# Patient Record
Sex: Female | Born: 1970 | Race: Black or African American | Hispanic: No | Marital: Married | Smoking: Former smoker
Health system: Southern US, Community
[De-identification: ages and names within clinical notes are randomized; demographics above are authoritative.]

## PROBLEM LIST (undated history)

## (undated) DIAGNOSIS — D649 Anemia, unspecified: Secondary | ICD-10-CM

## (undated) DIAGNOSIS — O00109 Unspecified tubal pregnancy without intrauterine pregnancy: Secondary | ICD-10-CM

## (undated) DIAGNOSIS — I10 Essential (primary) hypertension: Secondary | ICD-10-CM

## (undated) DIAGNOSIS — Z9289 Personal history of other medical treatment: Secondary | ICD-10-CM

## (undated) DIAGNOSIS — R079 Chest pain, unspecified: Secondary | ICD-10-CM

## (undated) DIAGNOSIS — Z9889 Other specified postprocedural states: Secondary | ICD-10-CM

## (undated) DIAGNOSIS — E559 Vitamin D deficiency, unspecified: Secondary | ICD-10-CM

## (undated) DIAGNOSIS — Z923 Personal history of irradiation: Secondary | ICD-10-CM

## (undated) DIAGNOSIS — R001 Bradycardia, unspecified: Secondary | ICD-10-CM

## (undated) HISTORY — DX: Vitamin D deficiency, unspecified: E55.9

## (undated) HISTORY — DX: Other specified postprocedural states: Z98.890

## (undated) HISTORY — DX: Essential (primary) hypertension: I10

## (undated) HISTORY — PX: TUBAL LIGATION: SHX77

## (undated) HISTORY — DX: Personal history of irradiation: Z92.3

## (undated) HISTORY — DX: Bradycardia, unspecified: R00.1

## (undated) HISTORY — PX: ECTOPIC PREGNANCY SURGERY: SHX613

## (undated) HISTORY — DX: Personal history of other medical treatment: Z92.89

---

## 1997-10-08 ENCOUNTER — Inpatient Hospital Stay (HOSPITAL_COMMUNITY): Admission: AD | Admit: 1997-10-08 | Discharge: 1997-10-08 | Payer: Self-pay | Admitting: Obstetrics

## 2000-08-17 ENCOUNTER — Inpatient Hospital Stay (HOSPITAL_COMMUNITY): Admission: AD | Admit: 2000-08-17 | Discharge: 2000-08-17 | Payer: Self-pay | Admitting: Internal Medicine

## 2000-12-07 ENCOUNTER — Encounter: Payer: Self-pay | Admitting: *Deleted

## 2000-12-07 ENCOUNTER — Encounter: Admission: RE | Admit: 2000-12-07 | Discharge: 2000-12-07 | Payer: Self-pay | Admitting: *Deleted

## 2007-09-05 ENCOUNTER — Emergency Department (HOSPITAL_COMMUNITY): Admission: EM | Admit: 2007-09-05 | Discharge: 2007-09-05 | Payer: Self-pay | Admitting: Emergency Medicine

## 2009-05-27 ENCOUNTER — Emergency Department (HOSPITAL_COMMUNITY): Admission: EM | Admit: 2009-05-27 | Discharge: 2009-05-27 | Payer: Self-pay | Admitting: Emergency Medicine

## 2011-08-17 DIAGNOSIS — E559 Vitamin D deficiency, unspecified: Secondary | ICD-10-CM

## 2011-08-17 DIAGNOSIS — D649 Anemia, unspecified: Secondary | ICD-10-CM

## 2011-08-17 HISTORY — DX: Anemia, unspecified: D64.9

## 2011-08-17 HISTORY — DX: Vitamin D deficiency, unspecified: E55.9

## 2012-03-16 DIAGNOSIS — R001 Bradycardia, unspecified: Secondary | ICD-10-CM

## 2012-03-16 DIAGNOSIS — Z9289 Personal history of other medical treatment: Secondary | ICD-10-CM

## 2012-03-16 DIAGNOSIS — I10 Essential (primary) hypertension: Secondary | ICD-10-CM

## 2012-03-16 HISTORY — DX: Personal history of other medical treatment: Z92.89

## 2012-03-16 HISTORY — DX: Essential (primary) hypertension: I10

## 2012-03-16 HISTORY — DX: Bradycardia, unspecified: R00.1

## 2012-03-21 ENCOUNTER — Encounter (HOSPITAL_COMMUNITY): Payer: Self-pay | Admitting: *Deleted

## 2012-03-21 ENCOUNTER — Observation Stay (HOSPITAL_COMMUNITY)
Admission: EM | Admit: 2012-03-21 | Discharge: 2012-03-23 | Disposition: A | Discharge status: Home / Self Care | Payer: MEDICAID | Attending: Internal Medicine | Admitting: Internal Medicine

## 2012-03-21 ENCOUNTER — Emergency Department (HOSPITAL_COMMUNITY): Payer: Self-pay

## 2012-03-21 DIAGNOSIS — F17200 Nicotine dependence, unspecified, uncomplicated: Secondary | ICD-10-CM | POA: Insufficient documentation

## 2012-03-21 DIAGNOSIS — R079 Chest pain, unspecified: Principal | ICD-10-CM | POA: Insufficient documentation

## 2012-03-21 DIAGNOSIS — D649 Anemia, unspecified: Secondary | ICD-10-CM | POA: Insufficient documentation

## 2012-03-21 DIAGNOSIS — I10 Essential (primary) hypertension: Secondary | ICD-10-CM | POA: Diagnosis present

## 2012-03-21 DIAGNOSIS — R0602 Shortness of breath: Secondary | ICD-10-CM | POA: Insufficient documentation

## 2012-03-21 DIAGNOSIS — I498 Other specified cardiac arrhythmias: Secondary | ICD-10-CM | POA: Insufficient documentation

## 2012-03-21 DIAGNOSIS — Z720 Tobacco use: Secondary | ICD-10-CM

## 2012-03-21 DIAGNOSIS — Z23 Encounter for immunization: Secondary | ICD-10-CM | POA: Insufficient documentation

## 2012-03-21 DIAGNOSIS — R0609 Other forms of dyspnea: Secondary | ICD-10-CM | POA: Insufficient documentation

## 2012-03-21 DIAGNOSIS — Z87891 Personal history of nicotine dependence: Secondary | ICD-10-CM | POA: Diagnosis present

## 2012-03-21 DIAGNOSIS — R001 Bradycardia, unspecified: Secondary | ICD-10-CM | POA: Diagnosis present

## 2012-03-21 DIAGNOSIS — R0989 Other specified symptoms and signs involving the circulatory and respiratory systems: Secondary | ICD-10-CM | POA: Insufficient documentation

## 2012-03-21 DIAGNOSIS — R0601 Orthopnea: Secondary | ICD-10-CM | POA: Insufficient documentation

## 2012-03-21 HISTORY — DX: Unspecified tubal pregnancy without intrauterine pregnancy: O00.109

## 2012-03-21 HISTORY — DX: Anemia, unspecified: D64.9

## 2012-03-21 HISTORY — DX: Chest pain, unspecified: R07.9

## 2012-03-21 LAB — BASIC METABOLIC PANEL WITH GFR
BUN: 15 mg/dL (ref 6–23)
CO2: 27 meq/L (ref 19–32)
Calcium: 9.5 mg/dL (ref 8.4–10.5)
Chloride: 104 meq/L (ref 96–112)
Creatinine, Ser: 0.81 mg/dL (ref 0.50–1.10)
GFR calc Af Amer: >90 mL/min
GFR calc non Af Amer: 88 mL/min — ABNORMAL LOW
Glucose, Bld: 92 mg/dL (ref 70–99)
Potassium: 4.4 meq/L (ref 3.5–5.1)
Sodium: 139 meq/L (ref 135–145)

## 2012-03-21 LAB — CBC
HCT: 35.8 % — ABNORMAL LOW (ref 36.0–46.0)
Hemoglobin: 12.1 g/dL (ref 12.0–15.0)
MCH: 29.1 pg (ref 26.0–34.0)
MCHC: 33.8 g/dL (ref 30.0–36.0)
MCV: 86.1 fL (ref 78.0–100.0)
Platelets: 252 10*3/uL (ref 150–400)
RBC: 4.16 MIL/uL (ref 3.87–5.11)
RDW: 16 % — ABNORMAL HIGH (ref 11.5–15.5)
WBC: 8.9 10*3/uL (ref 4.0–10.5)

## 2012-03-21 LAB — POCT I-STAT TROPONIN I: Troponin i, poc: 0 ng/mL (ref 0.00–0.08)

## 2012-03-21 MED ORDER — ASPIRIN 81 MG PO CHEW
324.0000 mg | CHEWABLE_TABLET | Freq: Once | ORAL | Status: AC
  Administered 2012-03-21: 324 mg via ORAL
  Filled 2012-03-21: qty 4

## 2012-03-21 NOTE — ED Notes (Signed)
Pt states on Saturday had episode of CP but went back to sleep.  Today, experiencing the same, took BP at work and it was high.  No hx of HTN

## 2012-03-21 NOTE — ED Provider Notes (Signed)
History     CSN: [id number]  Arrival date & time 03/21/12  1919   First MD Initiated Contact with Patient 03/21/12 2303      Chief Complaint  Patient presents with  . Hypertension  . Chest Pain    (Consider location/radiation/quality/duration/timing/severity/associated sxs/prior treatment) HPI HX per PT. CP tonight, started 9:30, located L sided and R sided in different areas. Sharp in quality, was moderate pain now 0/10. Some DOE and orthopnea, no h/o CHF. No leg pain or swelling. No F/C. No cough. Pain non radiating. No h/o HTN. No h/o DM or HLD. Smoker without sig FH of early CAD - father in his 47s with heart problems.    Has never had stress test, no PCP.   No past medical history on file.  Past Surgical History  Procedure Date  . Cesarean section     No family history on file.  History  Substance Use Topics  . Smoking status: Current Everyday Smoker -- 0.2 packs/day  . Smokeless tobacco: Not on file  . Alcohol Use: Yes    OB History    Grav Para Term Preterm Abortions TAB SAB Ect Mult Living                  Review of Systems  Constitutional: Negative for fever and chills.  HENT: Negative for neck pain and neck stiffness.   Eyes: Negative for pain.  Respiratory: Negative for cough and wheezing.   Cardiovascular: Positive for chest pain.  Gastrointestinal: Negative for abdominal pain.  Genitourinary: Negative for dysuria.  Musculoskeletal: Negative for back pain.  Skin: Negative for rash.  Neurological: Negative for headaches.  All other systems reviewed and are negative.    Allergies  Review of patient's allergies indicates no known allergies.  Home Medications  No current outpatient prescriptions on file.  BP 168/117  Pulse 58  Temp 98.4 F (36.9 C) (Oral)  Resp 13  SpO2 100%  LMP 03/09/2012  Physical Exam  Constitutional: She is oriented to person, place, and time. She appears well-developed and well-nourished.  HENT:  Head:  Normocephalic and atraumatic.  Eyes: Conjunctivae and EOM are normal. Pupils are equal, round, and reactive to light.  Neck: Trachea normal and full passive range of motion without pain. Neck supple. No thyromegaly present.  Cardiovascular: Normal rate, regular rhythm, S1 normal, S2 normal, intact distal pulses and normal pulses.     No systolic murmur is present   No diastolic murmur is present  Pulses:      Radial pulses are 2+ on the right side, and 2+ on the left side.  Pulmonary/Chest: Effort normal and breath sounds normal. She has no wheezes. She has no rhonchi. She has no rales. She exhibits no tenderness.  Abdominal: Soft. Normal appearance and bowel sounds are normal. There is no tenderness. There is no CVA tenderness and negative Murphy's sign.  Musculoskeletal: Normal range of motion.       BLE:s Calves nontender, no cords or erythema, negative Homans sign  Neurological: She is alert and oriented to person, place, and time. She has normal strength and normal reflexes. No cranial nerve deficit or sensory deficit. She displays a negative Romberg sign. GCS eye subscore is 4. GCS verbal subscore is 5. GCS motor subscore is 6.  Skin: Skin is warm and dry. No rash noted. She is not diaphoretic. No cyanosis. Nails show no clubbing.  Psychiatric: She has a normal mood and affect. Her speech is normal and behavior is  normal.       Cooperative and appropriate    ED Course  Procedures (including critical care time)  Results for orders placed during the hospital encounter of 03/21/12  CBC      Component Value Range   WBC 8.9  4.0 - 10.5 K/uL   RBC 4.16  3.87 - 5.11 MIL/uL   Hemoglobin 12.1  12.0 - 15.0 g/dL   HCT 40.9 (*) 81.1 - 91.4 %   MCV 86.1  78.0 - 100.0 fL   MCH 29.1  26.0 - 34.0 pg   MCHC 33.8  30.0 - 36.0 g/dL   RDW 78.2 (*) 95.6 - 21.3 %   Platelets 252  150 - 400 K/uL  BASIC METABOLIC PANEL      Component Value Range   Sodium 139  135 - 145 mEq/L   Potassium 4.4  3.5 -  5.1 mEq/L   Chloride 104  96 - 112 mEq/L   CO2 27  19 - 32 mEq/L   Glucose, Bld 92  70 - 99 mg/dL   BUN 15  6 - 23 mg/dL   Creatinine, Ser 0.86  0.50 - 1.10 mg/dL   Calcium 9.5  8.4 - 57.8 mg/dL   GFR calc non Af Amer 88 (*) >90 mL/min   GFR calc Af Amer >90  >90 mL/min  POCT I-STAT TROPONIN I      Component Value Range   Troponin i, poc 0.00  0.00 - 0.08 ng/mL   Comment 3            Dg Chest 2 View  03/21/2012  *RADIOLOGY REPORT*  Clinical Data: Chest pain, shortness of breath  CHEST - 2 VIEW  Comparison: None.  Findings: Mild cardiomegaly.  Lungs clear.  No effusion.  Mild dextroscoliosis of the thoracic spine without evident vertebral anomaly.  IMPRESSION:  1.  Mild cardiomegaly  Original Report Authenticated By: Osa Craver, M.D.     Date: 03/21/2012  Rate: 59  Rhythm: normal sinus rhythm  QRS Axis: normal  Intervals: normal  ST/T Wave abnormalities: nonspecific ST changes  Conduction Disutrbances:none  Narrative Interpretation:   Old EKG Reviewed: none available  ASA  Pain free in ED, recurrent CP no Dx. Plan CDU OBS coronary CT scan in am  BMI calculated 31  Placed in CDU and had recurrent CP and repeat ECG as below   Date: 03/22/2012  Rate: 36  Rhythm: sinus bradycardia  QRS Axis: normal  Intervals: normal  ST/T Wave abnormalities: nonspecific ST/T changes  Conduction Disutrbances:none  Narrative Interpretation: flipped T waves V1-v3  Old EKG Reviewed: changes noted  2:20 AM D/W CAR fellow on call for [place] agrees to ED evaluation.  DR Foley (CAR) evaluated PT and recs MED admit. Pending CT Angio for elevated d-dimer.   CT reviewed and d/w triad on call for MED admit.    MDM   Nursing notes reviewed. Vital signs reviewed. Old records reviewed. ECG, labs and CXR reviewed. MED and CAR c/s and admit.         [name], MD 03/22/12 [phone]

## 2012-03-22 ENCOUNTER — Observation Stay (HOSPITAL_COMMUNITY): Payer: Self-pay

## 2012-03-22 ENCOUNTER — Encounter (HOSPITAL_COMMUNITY): Payer: Self-pay | Admitting: Radiology

## 2012-03-22 DIAGNOSIS — Z87891 Personal history of nicotine dependence: Secondary | ICD-10-CM | POA: Diagnosis present

## 2012-03-22 DIAGNOSIS — R072 Precordial pain: Secondary | ICD-10-CM

## 2012-03-22 DIAGNOSIS — I10 Essential (primary) hypertension: Secondary | ICD-10-CM | POA: Diagnosis present

## 2012-03-22 DIAGNOSIS — R079 Chest pain, unspecified: Secondary | ICD-10-CM

## 2012-03-22 DIAGNOSIS — R0602 Shortness of breath: Secondary | ICD-10-CM | POA: Diagnosis present

## 2012-03-22 DIAGNOSIS — F17200 Nicotine dependence, unspecified, uncomplicated: Secondary | ICD-10-CM

## 2012-03-22 DIAGNOSIS — R001 Bradycardia, unspecified: Secondary | ICD-10-CM | POA: Diagnosis present

## 2012-03-22 HISTORY — DX: Chest pain, unspecified: R07.9

## 2012-03-22 LAB — GLUCOSE, CAPILLARY
Glucose-Capillary: 92 mg/dL (ref 70–99)
Glucose-Capillary: 74 mg/dL (ref 70–99)

## 2012-03-22 LAB — CBC WITH DIFFERENTIAL/PLATELET
Basophils Absolute: 0 10*3/uL (ref 0.0–0.1)
Basophils Relative: 0 % (ref 0–1)
Eosinophils Absolute: 0.2 10*3/uL (ref 0.0–0.7)
Eosinophils Relative: 4 % (ref 0–5)
HCT: 32.3 % — ABNORMAL LOW (ref 36.0–46.0)
Hemoglobin: 10.9 g/dL — ABNORMAL LOW (ref 12.0–15.0)
Lymphocytes Relative: 25 % (ref 12–46)
Lymphs Abs: 1.6 10*3/uL (ref 0.7–4.0)
MCH: 29.5 pg (ref 26.0–34.0)
MCHC: 33.7 g/dL (ref 30.0–36.0)
MCV: 87.5 fL (ref 78.0–100.0)
Monocytes Absolute: 0.4 10*3/uL (ref 0.1–1.0)
Monocytes Relative: 7 % (ref 3–12)
Neutro Abs: 4.2 10*3/uL (ref 1.7–7.7)
Neutrophils Relative %: 65 % (ref 43–77)
Platelets: 204 10*3/uL (ref 150–400)
RBC: 3.69 MIL/uL — ABNORMAL LOW (ref 3.87–5.11)
RDW: 15.9 % — ABNORMAL HIGH (ref 11.5–15.5)
WBC: 6.5 10*3/uL (ref 4.0–10.5)

## 2012-03-22 LAB — COMPREHENSIVE METABOLIC PANEL
ALT: 12 U/L (ref 0–35)
AST: 16 U/L (ref 0–37)
Albumin: 3.3 g/dL — ABNORMAL LOW (ref 3.5–5.2)
Alkaline Phosphatase: 59 U/L (ref 39–117)
BUN: 11 mg/dL (ref 6–23)
CO2: 24 mEq/L (ref 19–32)
Calcium: 8.6 mg/dL (ref 8.4–10.5)
Chloride: 106 mEq/L (ref 96–112)
Creatinine, Ser: 0.66 mg/dL (ref 0.50–1.10)
GFR calc Af Amer: >90 mL/min (ref >90)
GFR calc non Af Amer: >90 mL/min (ref >90)
Glucose, Bld: 85 mg/dL (ref 70–99)
Potassium: 3.5 mEq/L (ref 3.5–5.1)
Sodium: 139 mEq/L (ref 135–145)
Total Bilirubin: 0.3 mg/dL (ref 0.3–1.2)
Total Protein: 6.2 g/dL (ref 6.0–8.3)

## 2012-03-22 LAB — CARDIAC PANEL(CRET KIN+CKTOT+MB+TROPI)
CK, MB: 1.5 ng/mL (ref 0.3–4.0)
CK, MB: 1.4 ng/mL (ref 0.3–4.0)
CK, MB: 1.6 ng/mL (ref 0.3–4.0)
CK, MB: 1.7 ng/mL (ref 0.3–4.0)
Relative Index: INVALID (ref 0.0–2.5)
Relative Index: INVALID (ref 0.0–2.5)
Relative Index: INVALID (ref 0.0–2.5)
Relative Index: INVALID (ref 0.0–2.5)
Total CK: 54 U/L (ref 7–177)
Total CK: 56 U/L (ref 7–177)
Total CK: 59 U/L (ref 7–177)
Total CK: 52 U/L (ref 7–177)
Troponin I: <0.3 ng/mL (ref <0.30)
Troponin I: <0.3 ng/mL (ref <0.30)
Troponin I: <0.3 ng/mL (ref <0.30)
Troponin I: <0.3 ng/mL (ref <0.30)

## 2012-03-22 LAB — D-DIMER, QUANTITATIVE (NOT AT ARMC): D-Dimer, Quant: 0.67 ug/mL-FEU — ABNORMAL HIGH (ref 0.00–0.48)

## 2012-03-22 LAB — PRO B NATRIURETIC PEPTIDE: Pro B Natriuretic peptide (BNP): 136 pg/mL — ABNORMAL HIGH (ref 0–125)

## 2012-03-22 LAB — POCT I-STAT TROPONIN I: Troponin i, poc: 0.02 ng/mL (ref 0.00–0.08)

## 2012-03-22 LAB — TSH: TSH: 1.083 u[IU]/mL (ref 0.350–4.500)

## 2012-03-22 LAB — PREGNANCY, URINE: Preg Test, Ur: NEGATIVE

## 2012-03-22 MED ORDER — SODIUM CHLORIDE 0.9 % IJ SOLN
3.0000 mL | Freq: Two times a day (BID) | INTRAMUSCULAR | Status: DC
  Administered 2012-03-22 – 2012-03-23 (×2): 3 mL via INTRAVENOUS

## 2012-03-22 MED ORDER — ASPIRIN EC 325 MG PO TBEC
325.0000 mg | DELAYED_RELEASE_TABLET | Freq: Every day | ORAL | Status: DC
  Filled 2012-03-22: qty 1

## 2012-03-22 MED ORDER — SODIUM CHLORIDE 0.9 % IJ SOLN: 3.0000 mL | Freq: Two times a day (BID) | INTRAMUSCULAR | Status: DC

## 2012-03-22 MED ORDER — ONDANSETRON HCL 4 MG PO TABS: 4.0000 mg | ORAL_TABLET | Freq: Four times a day (QID) | ORAL | Status: DC | PRN

## 2012-03-22 MED ORDER — NITROGLYCERIN 0.4 MG SL SUBL
0.4000 mg | SUBLINGUAL_TABLET | SUBLINGUAL | Status: DC | PRN
  Administered 2012-03-22 (×2): 0.4 mg via SUBLINGUAL
  Filled 2012-03-22: qty 75
  Filled 2012-03-22: qty 25

## 2012-03-22 MED ORDER — ASPIRIN EC 81 MG PO TBEC
81.0000 mg | DELAYED_RELEASE_TABLET | Freq: Every day | ORAL | Status: DC
  Administered 2012-03-23: 81 mg via ORAL
  Filled 2012-03-22: qty 1

## 2012-03-22 MED ORDER — IOHEXOL 350 MG/ML SOLN
100.0000 mL | Freq: Once | INTRAVENOUS | Status: AC | PRN
  Administered 2012-03-22: 100 mL via INTRAVENOUS

## 2012-03-22 MED ORDER — ACETAMINOPHEN 325 MG PO TABS: 650.0000 mg | ORAL_TABLET | Freq: Four times a day (QID) | ORAL | Status: DC | PRN

## 2012-03-22 MED ORDER — ACETAMINOPHEN 650 MG RE SUPP: 650.0000 mg | Freq: Four times a day (QID) | RECTAL | Status: DC | PRN

## 2012-03-22 MED ORDER — PNEUMOCOCCAL VAC POLYVALENT 25 MCG/0.5ML IJ INJ
0.5000 mL | INJECTION | Freq: Once | INTRAMUSCULAR | Status: AC
  Administered 2012-03-22: 0.5 mL via INTRAMUSCULAR
  Filled 2012-03-22: qty 0.5

## 2012-03-22 MED ORDER — AMLODIPINE BESYLATE 5 MG PO TABS
5.0000 mg | ORAL_TABLET | Freq: Every day | ORAL | Status: DC
  Administered 2012-03-22 – 2012-03-23 (×2): 5 mg via ORAL
  Filled 2012-03-22 (×2): qty 1

## 2012-03-22 MED ORDER — ONDANSETRON HCL 4 MG/2ML IJ SOLN: 4.0000 mg | Freq: Four times a day (QID) | INTRAMUSCULAR | Status: DC | PRN

## 2012-03-22 MED ORDER — ENOXAPARIN SODIUM 40 MG/0.4ML ~~LOC~~ SOLN
40.0000 mg | SUBCUTANEOUS | Status: DC
  Administered 2012-03-22 – 2012-03-23 (×2): 40 mg via SUBCUTANEOUS
  Filled 2012-03-22 (×2): qty 0.4

## 2012-03-22 NOTE — Progress Notes (Signed)
  Echocardiogram 2D Echocardiogram has been performed.  Georgian Co 03/22/2012, 8:50 AM

## 2012-03-22 NOTE — Progress Notes (Signed)
Patient: Christy Waller Date of Encounter: 03/22/2012, 10:56 AM Admit date: 03/21/2012     Subjective  Still has CP when talking. HR sustaining in 40s on telemetry. She reports 2 episodes of near syncope, once on Saturday and once yesterday. Not SOB prior to Saturday. Used to be very active exercising up until about a month ago but stopped because she got busy. Denies any melena, hematemesis, hematuria, BRBPR, epistaxis or any other obvious sources of bleeding. LMP 03/09/12. Reports prior hx of anemia when pregnant 20+ yrs ago but does not see a doctor regularly. Not on any AV nodal blocking agents.  2D Echo as reviewed done today:  - Left ventricle: Posterior basal hypokinesis The cavity size was normal. Wall thickness was normal. Systolic function was normal. The estimated ejection fraction was 50%. Wall motion was normal; there were no regional wall motion abnormalities. - Left atrium: The atrium was mildly dilated. - Atrial septum: No defect or patent foramen ovale was identified.   Objective   Telemetry: sinus bradycardia HR mostly 40s, occ upper 30's/50s, no evidence of heart block or prolonged pauses Physical Exam: Filed Vitals:   03/22/12 1018  BP: 160/86  Pulse: 43  Temp: 97.7  Resp: 20   General: Well developed, well nourished AAF in no acute distress. Head: Normocephalic, atraumatic, sclera non-icteric, no xanthomas, nares are without discharge.  Neck: Negative for carotid bruits. JVD not elevated. Lungs: Clear bilaterally to auscultation without wheezes, rales, or rhonchi. Breathing is unlabored.   Heart: Regular rhythm, slow rate, S1 S2 without murmurs, rubs, or gallops.  Abdomen: Soft, non-tender, non-distended with normoactive bowel sounds. No hepatomegaly. No rebound/guarding. No obvious abdominal masses. Msk:  Strength and tone appear normal for age. Extremities: No clubbing or cyanosis. No edema.  Distal pedal pulses are 2+ and equal bilaterally. Neuro: Alert and  oriented X 3. Moves all extremities spontaneously. Psych:  Responds to questions appropriately with a normal affect.   No intake or output data in the 24 hours ending 03/22/12 1056  Inpatient Medications:    . aspirin  324 mg Oral Once  . aspirin EC  325 mg Oral Daily  . enoxaparin (LOVENOX) injection  40 mg Subcutaneous Q24H  . sodium chloride  3 mL Intravenous Q12H  . sodium chloride  3 mL Intravenous Q12H    Labs:  [facility] 03/22/12 0919 03/21/12 1944  NA 139 139  K 3.5 4.4  CL 106 104  CO2 24 27  GLUCOSE 85 92  BUN 11 15  CREATININE 0.66 0.81  CALCIUM 8.6 9.5  MG -- --  PHOS -- --    Basename 03/22/12 0919  AST 16  ALT 12  ALKPHOS 59  BILITOT 0.3  PROT 6.2  ALBUMIN 3.3*    Basename 03/22/12 0919 03/21/12 1944  WBC 6.5 8.9  NEUTROABS 4.2 --  HGB 10.9* 12.1  HCT 32.3* 35.8*  MCV 87.5 86.1  PLT 204 252    Basename 03/22/12 0919 03/22/12 0540  CKTOTAL 56 54  CKMB 1.4 1.5  TROPONINI <0.30 <0.30   pBNP 136  Basename 03/21/12 2346  DDIMER 0.67*   Radiology/Studies:  Dg Chest 2 View 03/21/2012  *RADIOLOGY REPORT*  Clinical Data: Chest pain, shortness of breath  CHEST - 2 VIEW  Comparison: None.  Findings: Mild cardiomegaly.  Lungs clear.  No effusion.  Mild dextroscoliosis of the thoracic spine without evident vertebral anomaly.  IMPRESSION:  1.  Mild cardiomegaly  Original Report Authenticated By: Osa Craver, M.D.  Ct Angio Chest Pe W/cm &/or Wo Cm 03/22/2012  *RADIOLOGY REPORT*  Clinical Data: Chest pain.  CT ANGIOGRAPHY CHEST  Technique:  Multidetector CT imaging of the chest using the standard protocol during bolus administration of intravenous contrast. Multiplanar reconstructed images including MIPs were obtained and reviewed to evaluate the vascular anatomy.  Contrast: OMNIPAQUE IOHEXOL 350 MG/ML SOLN  Comparison: None.  Findings: Technically adequate study with good opacification of the central and segmental pulmonary arteries.  No focal  filling defect. No evidence of significant pulmonary embolus.  Mild cardiac enlargement with retrograde flow of contrast material into the hepatic veins suggesting passive congestion.  Normal caliber thoracic aorta.  No significant lymphadenopathy in the chest.  The esophagus is decompressed.  Mild dependent changes in the lung bases.  No focal airspace consolidation or edema.  Airways appear patent.  No pneumothorax.  Mild degenerative changes in the cervical spine with normal alignment.  IMPRESSION: No evidence of significant pulmonary embolus.  Original Report Authenticated By: Marlon Pel, M.D.    Assessment and Plan   1. Chest pain/SOB - ruled out for MI thus far, CTA neg for PE, symptoms atypical but question relation to bradycardia. Echo showed posterior basal hypokinesis, EF 50%. Check lipid panel in AM. Will discuss further workup with MD. 2. Sinus bradycardia - Cause is unclear. Not on any AV blocking agents prior to admit. TSH pending. See below re: comprehensive thoughts. 3. Normocytic anemia - ?related to menses - she reports her period was much longer than usual with last cycle. Trend. Appreciate IM involvement to help with workup. No family hx of anemias. Decrease ASA to 81mg  daily. 4. HTN, not currently well controlled. Consider addition of ACEI or Norvasc.  Signed, Ronie Spies PA-C  Etiology of bradycardia is unclear.  I would continue to monitor her to see what happens with her rate.  She denies arthritis or other symptoms to suggest something unusual such as Lyme disease.  Her chest pain started a couple of days ago.  Enzymes negative.  ECG is not definitive with subtle T wave changes, nor is echo.  I will review CT films with radiology to get a feel for the coronaries, and also will consider coronary CTA on Friday once we have a better feeling about the HR.  Agree with holding meds which cause bradycardia.  Would consider treatment of hypertension as a baseline.    Patient  does smoke a lot of marijuana per history provided by the patient and confirmed by her relatives in the room.  Patient will need primary care assignment at dc to monitor many of her issues.

## 2012-03-22 NOTE — Progress Notes (Signed)
Utilization review complete 

## 2012-03-22 NOTE — ED Notes (Signed)
Complaining of chest pain, dizziness, SOB. Chest pain started Saturday. No pain Monday or Tuesday. Pain began again Wednesday at work. Dizzy at work. Blood pressure at that time was 154/100. Went home pain got progressively worse. Rates pain as 7/10. Located center chest with no radiation

## 2012-03-22 NOTE — Consult Note (Signed)
CARDIOLOGY CONSULT NOTE   Christy Waller MRN: [id number] DOB/AGE: 1971/04/23 41 y.o. Admit date: 03/21/2012   Primary Cardiologist: New pt  Reason for consultation:  Chest pain   HPI:  Pt is a 41 yo woman smoker with no significant PMH who presents with chest pain.  Pt reports that Sat night she was unable to lay flat because she felt SOB.  She slept on her boyfriend's chest with a couple of pillows.  She also noted some LE edema that day.  She denies PND or CP that day.  Then at 10 am this morning, she felt dizzy while she was at work.  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  She denies syncope now and in the past.  She called her sister and then a friend to come and pick her up from work.  She began to have chest pain because she was really worried about what might possibly be going on with her.  THe chest pain lasted for 20-30 mins and was better with sitting up.  She has recurrence of her chest pain in the ED and was noted to have possible TWI on EKG, so cardiology was consulted.  She was given nitro for her pain and it did not help.  The pain is worse with movement and with deep inspiration.  She was previously physically active doing a routine of squats and increasing the number each week.  She has also used the treadmill before.  She actually used to weigh over 300 lbs and has lost weight and kept it off.  She denies any prior chest pain with activity and she has not had any change in her exercise tolerance.  She has not travelled recently.  She denies any family hx of blood clots.  She denies any fevers, chill, n, v, d, abd pain.  She is eating and drinking normally.  No recent wt gain.  She does have sick exposures because she works in a NH.       Review of systems: A review of 10 organ systems was done and is negative except as stated above in HPI  PMH: pre-eclampsia, pt used to be morbidly obese  Past Surgical  History  Procedure Date  . Cesarean section    History   Social History  . Marital Status: Divorced    Spouse Name: N/A    Number of Children: N/A  . Years of Education: N/A   Occupational History  . Not on file.   Social History Main Topics  . Smoking status: Current Everyday Smoker -- 0.2 packs/day  . Smokeless tobacco: Not on file  . Alcohol Use: Yes  . Drug Use: Yes    Special: Marijuana  . Sexually Active:    Other Topics Concern  . Not on file   Social History Narrative  . No narrative on file    Prior 1.5ppd smoker since age 49, now cut down to 6 cigs daily  No fam hx early CAD.  Mother and father with HTN  No Known Allergies Home meds: none   (Not in a hospital admission)  Current facility-administered medications:aspirin chewable tablet 324 mg, 324 mg, Oral, Once, [name], MD, 324 mg at 03/21/12 2351;  nitroGLYCERIN (NITROSTAT) SL tablet 0.4 mg, 0.4 mg, Sublingual, Q5 min PRN, [name], MD, 0.4 mg at 03/22/12 0052 No current outpatient prescriptions on file.  Physical Exam: Blood  pressure 148/86, pulse 44, temperature 98.4 F (36.9 C), temperature source Oral, resp. rate 10, last menstrual period 03/09/2012, SpO2 100.00%.; There is no height or weight on file to calculate BMI. Temp:  [98.2 F (36.8 C)-98.4 F (36.9 C)] 98.4 F (36.9 C) (08/06 2145) Pulse Rate:  [44-80] 44  (08/07 0157) Resp:  [10-23] 10  (08/07 0115) BP: (120-185)/(73-117) 148/86 mmHg (08/07 0157) SpO2:  [95 %-100 %] 100 % (08/07 0157)  No intake or output data in the 24 hours ending 03/22/12 0249 General: NAD Heent: MMM Neck: No JVD  CV: RRR, no m - deep inspiration reproduces pain of chief complaint Lungs: Clear to auscultation bilaterally with normal respiratory effort Abdomen: Soft, nontender, nondistended Extremities: No clubbing or cyanosis. No pedal edema Skin: Intact without lesions or rashes  Neurologic: Alert and oriented x 3, grossly nonfocal  Psych: Normal  mood and affect    Labs: No results found for this basename: CKTOTAL:4,CKMB:4,TROPONINI:4 in the last 72 hours Lab Results  Component Value Date   WBC 8.9 03/21/2012   HGB 12.1 03/21/2012   HCT 35.8* 03/21/2012   MCV 86.1 03/21/2012   PLT 252 03/21/2012    Lab 03/21/12 1944  NA 139  K 4.4  CL 104  CO2 27  BUN 15  CREATININE 0.81  CALCIUM 9.5  PROT --  BILITOT --  ALKPHOS --  ALT --  AST --  GLUCOSE 92   No results found for this basename: CHOL, HDL, LDLCALC, TRIG   BNP 136 Trop 0.00-->0.02 D Dimer 0.67     EKG: 03/21/12 sinus brady, 59, nonspec ST changes 03/22/12 sinus brady 36, nonspec ST changes    Radiology:  Dg Chest 2 View  03/21/2012  *RADIOLOGY REPORT*  Clinical Data: Chest pain, shortness of breath  CHEST - 2 VIEW  Comparison: None.  Findings: Mild cardiomegaly.  Lungs clear.  No effusion.  Mild dextroscoliosis of the thoracic spine without evident vertebral anomaly.  IMPRESSION:  1.  Mild cardiomegaly  Original Report Authenticated By: Osa Craver, M.D.    ASSESSMENT: Pt is a 41 yo woman smoker with no significant PMH who presents with chest pain.  PLAN: Chest pain - pt with atypical sx, normal cardiac enzymes, and nonspec EKG.  Her cardiac risk factor is that she smokes.  Her d dimer is elevated and she does have pleuritic CP at this time and her sx started with SOB - agree with CTA to r/o PE; this will also show coronary ca2+, if present.  Her exam is not consistent with HF and her BNP is only mildly elevated.  Will check echo to r/o structural heart disease or HF as cause of her sx.  Continue to trend enzymes.  If CTA is negative for PE, would do exercise treadmill echo stress test to r/o cardiac etiology.     Bradycardia - pt with marked sinus bradycardia into 30's.  Unclear etiology.  She did get dizzy today, but never had syncope - this is likely unrelated.  She has no hx of syncope.  Pt is currently asymptomatic with a HR of 45.  Monitor on tele.   Consider sending out with outpt monitor to assess HR trend.  Avoid AV nodal blocking agents     Thank you for this consultation.  Will continue to follow.  Signed: Hilary Hertz, MD Cardiology Fellow 03/22/2012, 2:49 AM

## 2012-03-22 NOTE — ED Notes (Signed)
Complaining of chest pain 7/10. Heart rate in low 40's. EDP notified. Will continue to monitor patient.

## 2012-03-22 NOTE — ED Notes (Signed)
Paged [facility] to [phone]

## 2012-03-22 NOTE — ED Notes (Signed)
Pt states pain in chest worse with laying flat and talking. States when laying quietly she does not have any pain. States when she has the pain it is always in the middle of her sternum

## 2012-03-22 NOTE — H&P (Signed)
Christy Waller is an 41 y.o. female.   Patient was seen and examined on March 22, 2012 at 5:39 AM. PCP - none. Chief Complaint: Chest pain. HPI: 41 year-old female with no significant past medical history has been experiencing chest pain and shortness of breath last 2 days. Chest is retrosternal nonradiating increase on inspiration and increases on lying down. Has no relation to exertion. In association patient has shortness of breath which was increased on lying down. Patient has sometimes noticed swelling of the legs. CT angiogram of the chest was negative for PE. Cardiologist was consulted as patient's EKG showed T wave inversion. At this time cardiologist has recommended admission and get echocardiogram and possible stress test. Patient also initially was found to be in sinus bradycardia presently rate is around 50-54 per minute.  Past Medical History  Diagnosis Date  . No pertinent past medical history     Past Surgical History  Procedure Date  . Cesarean section   . Tubal ligation     Family History  Problem Relation Age of Onset  . Diabetes type II Sister    Social History:  reports that she has been smoking.  She does not have any smokeless tobacco history on file. She reports that she drinks alcohol. She reports that she uses illicit drugs (Marijuana).  Allergies: No Known Allergies   (Not in a hospital admission)  Results for orders placed during the hospital encounter of 03/21/12 (from the past 48 hour(s))  CBC     Status: Abnormal   Collection Time   03/21/12  7:44 PM      Component Value Range Comment   WBC 8.9  4.0 - 10.5 K/uL    RBC 4.16  3.87 - 5.11 MIL/uL    Hemoglobin 12.1  12.0 - 15.0 g/dL    HCT 43.3 (*) 29.5 - 46.0 %    MCV 86.1  78.0 - 100.0 fL    MCH 29.1  26.0 - 34.0 pg    MCHC 33.8  30.0 - 36.0 g/dL    RDW 18.8 (*) 41.6 - 15.5 %    Platelets 252  150 - 400 K/uL   BASIC METABOLIC PANEL     Status: Abnormal   Collection Time   03/21/12  7:44 PM   Component Value Range Comment   Sodium 139  135 - 145 mEq/L    Potassium 4.4  3.5 - 5.1 mEq/L    Chloride 104  96 - 112 mEq/L    CO2 27  19 - 32 mEq/L    Glucose, Bld 92  70 - 99 mg/dL    BUN 15  6 - 23 mg/dL    Creatinine, Ser 6.06  0.50 - 1.10 mg/dL    Calcium 9.5  8.4 - 30.1 mg/dL    GFR calc non Af Amer 88 (*) >90 mL/min    GFR calc Af Amer >90  >90 mL/min   POCT I-STAT TROPONIN I     Status: Normal   Collection Time   03/21/12  7:49 PM      Component Value Range Comment   Troponin i, poc 0.00  0.00 - 0.08 ng/mL    Comment 3            D-DIMER, QUANTITATIVE     Status: Abnormal   Collection Time   03/21/12 11:46 PM      Component Value Range Comment   D-Dimer, Quant 0.67 (*) 0.00 - 0.48 ug/mL-FEU   PRO B NATRIURETIC  PEPTIDE     Status: Abnormal   Collection Time   03/21/12 11:46 PM      Component Value Range Comment   Pro B Natriuretic peptide (BNP) 136.0 (*) 0 - 125 pg/mL   POCT I-STAT TROPONIN I     Status: Normal   Collection Time   03/22/12 12:04 AM      Component Value Range Comment   Troponin i, poc 0.02  0.00 - 0.08 ng/mL    Comment 3             Dg Chest 2 View  03/21/2012  *RADIOLOGY REPORT*  Clinical Data: Chest pain, shortness of breath  CHEST - 2 VIEW  Comparison: None.  Findings: Mild cardiomegaly.  Lungs clear.  No effusion.  Mild dextroscoliosis of the thoracic spine without evident vertebral anomaly.  IMPRESSION:  1.  Mild cardiomegaly  Original Report Authenticated By: Osa Craver, M.D.   Ct Angio Chest Pe W/cm &/or Wo Cm  03/22/2012  *RADIOLOGY REPORT*  Clinical Data: Chest pain.  CT ANGIOGRAPHY CHEST  Technique:  Multidetector CT imaging of the chest using the standard protocol during bolus administration of intravenous contrast. Multiplanar reconstructed images including MIPs were obtained and reviewed to evaluate the vascular anatomy.  Contrast: OMNIPAQUE IOHEXOL 350 MG/ML SOLN  Comparison: None.  Findings: Technically adequate study with good  opacification of the central and segmental pulmonary arteries.  No focal filling defect. No evidence of significant pulmonary embolus.  Mild cardiac enlargement with retrograde flow of contrast material into the hepatic veins suggesting passive congestion.  Normal caliber thoracic aorta.  No significant lymphadenopathy in the chest.  The esophagus is decompressed.  Mild dependent changes in the lung bases.  No focal airspace consolidation or edema.  Airways appear patent.  No pneumothorax.  Mild degenerative changes in the cervical spine with normal alignment.  IMPRESSION: No evidence of significant pulmonary embolus.  Original Report Authenticated By: Marlon Pel, M.D.    Review of Systems  Constitutional: Negative.   HENT: Negative.   Eyes: Negative.   Respiratory: Positive for shortness of breath.   Cardiovascular: Positive for chest pain.  Gastrointestinal: Negative.   Genitourinary: Negative.   Musculoskeletal: Negative.   Skin: Negative.   Neurological: Negative.   Endo/Heme/Allergies: Negative.   Psychiatric/Behavioral: Negative.     Blood pressure 148/86, pulse 44, temperature 98.4 F (36.9 C), temperature source Oral, resp. rate 10, last menstrual period 03/09/2012, SpO2 100.00%. Physical Exam  Constitutional: She is oriented to person, place, and time. She appears well-developed and well-nourished. No distress.  HENT:  Head: Normocephalic and atraumatic.  Right Ear: External ear normal.  Left Ear: External ear normal.  Nose: Nose normal.  Mouth/Throat: Oropharynx is clear and moist. No oropharyngeal exudate.  Eyes: Conjunctivae are normal. Pupils are equal, round, and reactive to light. Right eye exhibits no discharge. Left eye exhibits no discharge. No scleral icterus.  Neck: Normal range of motion. Neck supple.  Cardiovascular: Normal rate and regular rhythm.   Respiratory: Effort normal and breath sounds normal. No respiratory distress. She has no wheezes. She has  no rales.  GI: Soft. Bowel sounds are normal.  Musculoskeletal: Normal range of motion. She exhibits no edema and no tenderness.  Neurological: She is alert and oriented to person, place, and time.       Moves all extremities.  Skin: Skin is warm and dry. She is not diaphoretic.     Assessment/Plan #1. Chest pain with shortness of  breath - cardiologist has already seen the patient. Chest pain is atypical but since patient has symptoms of possible CHF at this time they're requested 2-D echo and also possible stress test. Cycle cardiac markers.  #2. Tobacco abuse - advised to quit smoking.  CODE STATUS - full code.  Eduard Clos. 03/22/2012, 5:39 AM

## 2012-03-22 NOTE — Progress Notes (Signed)
[facility] PROGRESS NOTE  Christy Waller NWG:[id number] DOB: May 15, 1971 DOA: 03/21/2012 PCP: Sheila Oats, MD  Assessment/Plan: Principal Problem:  *Chest pain Active Problems:  Shortness of breath  Tobacco abuse  Bradycardia  HTN (hypertension)  1. Chest pain: The patient presented with chest pain that is atypical. Her cardiac enzymes thus far have been negative. I appreciate cardiology's input and will defer to them on the management and further assessment of the chest pain. 2. Hypertension: The patient was found to be hypotensive with bradycardia. I will treat her hypertension and his Norvasc as any AV nodal blocking drugs need to be avoided at this time. The patient was started on Norvasc 5 mg by mouth daily. 3. Bradycardia: The etiology of the bradycardia and its clinical significance is unclear as the patient has a maintained blood pressure. I will also check a TSH on this patient as her physical features are consistent with possible hypothyroidism. 4. Tobacco use disorder: The patient has been counseled against further tobacco use.  Code Status: Full code Family Communication: Not applicable Disposition Plan: Plan at the time of discharge is to be discharged home  Sandra Brents A.  [facility] Pager [phone]. If 8PM-8AM, please contact night-coverage at [email or web address], password [facility] 03/22/2012, 3:08 PM  LOS: 1 day   Brief narrative: 41 year-old female with no significant past medical history has been experiencing chest pain and shortness of breath last 2 days. Chest is retrosternal nonradiating increase on inspiration and increases on lying down. Has no relation to exertion. In association patient has shortness of breath which was increased on lying down. Patient has sometimes noticed swelling of the legs. CT angiogram of the chest was negative for PE.  Patient also initially was found to be in sinus bradycardia presently rate is around 50-54 per  minute.   Consultants:  Cardiology-[facility]  Procedures:  None  Antibiotics:  None  HPI/Subjective: I was called to the bedside by the nurse for patient complaining of chest pain and bradycardia the time of arrival to the floor. Upon my arrival to see the patient she was very anxious mostly due to the flurry of activity around her. After explaining the situation to the patient she calmed down and stated that her pain had subsided significantly. Please note the patient also received one dose of sublingual nitroglycerin but did not have any appreciable change in her pain after the sublingual nitroglycerin.  Upon questioning the patient she states that she has cold intolerance and has been having loss of her here for some time. She denies constipation or diarrhea.    Objective: Filed Vitals:   03/22/12 0910 03/22/12 0955 03/22/12 1018 03/22/12 1357  BP: 152/90 165/98 160/86 153/68  Pulse: 40 46 43   Temp:  97.7 F (36.5 C)    TempSrc:  Other (Comment)    Resp: 20 20    Height:  5\' 1"  (1.549 m)    Weight:  77.384 kg (170 lb 9.6 oz)    SpO2: 100% 100% 100%    Weight change:   Intake/Output Summary (Last 24 hours) at 03/22/12 1508 Last data filed at 03/22/12 1159  Gross per 24 hour  Intake      3 ml  Output      0 ml  Net      3 ml    General: Alert, awake, oriented x3, in no acute distress.  HEENT: [place]/AT PEERL, EOMI Neck: Trachea midline,  no masses, no thyromegal,y no JVD, no carotid bruit OROPHARYNX:  Moist, No  exudate/ erythema/lesions.  Heart: Bradycardic but without murmurs, rubs, gallops, PMI non-displaced, no heaves or thrills on palpation.  Lungs: Clear to auscultation. Abdomen: Soft, nontender, nondistended, positive bowel sounds, no masses no hepatosplenomegaly noted..  Neuro: No focal neurological deficits noted. Strength normal in bilateral upper and lower extremities. Musculoskeletal: No warm swelling or erythema around joints, no spinal tenderness  noted. Psychiatric: Patient alert and oriented x3, good insight and cognition, good recent to remote recall.   Data Reviewed: Basic Metabolic Panel:  Lab 03/22/12 4098 03/21/12 1944  NA 139 139  K 3.5 4.4  CL 106 104  CO2 24 27  GLUCOSE 85 92  BUN 11 15  CREATININE 0.66 0.81  CALCIUM 8.6 9.5  MG -- --  PHOS -- --   Liver Function Tests:  Lab 03/22/12 0919  AST 16  ALT 12  ALKPHOS 59  BILITOT 0.3  PROT 6.2  ALBUMIN 3.3*   No results found for this basename: LIPASE:5,AMYLASE:5 in the last 168 hours No results found for this basename: AMMONIA:5 in the last 168 hours CBC:  Lab 03/22/12 0919 03/21/12 1944  WBC 6.5 8.9  NEUTROABS 4.2 --  HGB 10.9* 12.1  HCT 32.3* 35.8*  MCV 87.5 86.1  PLT 204 252   Cardiac Enzymes:  Lab 03/22/12 1118 03/22/12 0919 03/22/12 0540  CKTOTAL 59 56 54  CKMB 1.6 1.4 1.5  CKMBINDEX -- -- --  TROPONINI <0.30 <0.30 <0.30   BNP (last 3 results)  Basename 03/21/12 2346  PROBNP 136.0*   CBG:  Lab 03/22/12 1203  GLUCAP 92    No results found for this or any previous visit (from the past 240 hour(s)).   Studies: Dg Chest 2 View  03/21/2012  *RADIOLOGY REPORT*  Clinical Data: Chest pain, shortness of breath  CHEST - 2 VIEW  Comparison: None.  Findings: Mild cardiomegaly.  Lungs clear.  No effusion.  Mild dextroscoliosis of the thoracic spine without evident vertebral anomaly.  IMPRESSION:  1.  Mild cardiomegaly  Original Report Authenticated By: Osa Craver, M.D.   Ct Angio Chest Pe W/cm &/or Wo Cm  03/22/2012  *RADIOLOGY REPORT*  Clinical Data: Chest pain.  CT ANGIOGRAPHY CHEST  Technique:  Multidetector CT imaging of the chest using the standard protocol during bolus administration of intravenous contrast. Multiplanar reconstructed images including MIPs were obtained and reviewed to evaluate the vascular anatomy.  Contrast: OMNIPAQUE IOHEXOL 350 MG/ML SOLN  Comparison: None.  Findings: Technically adequate study with good  opacification of the central and segmental pulmonary arteries.  No focal filling defect. No evidence of significant pulmonary embolus.  Mild cardiac enlargement with retrograde flow of contrast material into the hepatic veins suggesting passive congestion.  Normal caliber thoracic aorta.  No significant lymphadenopathy in the chest.  The esophagus is decompressed.  Mild dependent changes in the lung bases.  No focal airspace consolidation or edema.  Airways appear patent.  No pneumothorax.  Mild degenerative changes in the cervical spine with normal alignment.  IMPRESSION: No evidence of significant pulmonary embolus.  Original Report Authenticated By: Marlon Pel, M.D.    Scheduled Meds:   . amLODipine  5 mg Oral Daily  . aspirin  324 mg Oral Once  . aspirin EC  81 mg Oral Daily  . enoxaparin (LOVENOX) injection  40 mg Subcutaneous Q24H  . pneumococcal 23 valent vaccine  0.5 mL Intramuscular Once  . sodium chloride  3 mL Intravenous Q12H  . sodium chloride  3 mL  Intravenous Q12H  . DISCONTD: aspirin EC  325 mg Oral Daily   Continuous Infusions:   Principal Problem:  *Chest pain Active Problems:  Shortness of breath  Tobacco abuse  Bradycardia  HTN (hypertension)

## 2012-03-23 ENCOUNTER — Observation Stay (HOSPITAL_COMMUNITY): Payer: Self-pay

## 2012-03-23 ENCOUNTER — Encounter: Payer: Self-pay | Admitting: Internal Medicine

## 2012-03-23 DIAGNOSIS — R079 Chest pain, unspecified: Secondary | ICD-10-CM

## 2012-03-23 DIAGNOSIS — I498 Other specified cardiac arrhythmias: Secondary | ICD-10-CM

## 2012-03-23 DIAGNOSIS — I10 Essential (primary) hypertension: Secondary | ICD-10-CM

## 2012-03-23 LAB — LIPID PANEL
Cholesterol: 113 mg/dL (ref 0–200)
HDL: 59 mg/dL (ref >39)
LDL Cholesterol: 41 mg/dL (ref 0–99)
Total CHOL/HDL Ratio: 1.9 RATIO
Triglycerides: 66 mg/dL (ref <150)
VLDL: 13 mg/dL (ref 0–40)

## 2012-03-23 LAB — CARDIAC PANEL(CRET KIN+CKTOT+MB+TROPI)
CK, MB: 1.6 ng/mL (ref 0.3–4.0)
Relative Index: INVALID (ref 0.0–2.5)
Total CK: 53 U/L (ref 7–177)
Troponin I: <0.3 ng/mL (ref <0.30)

## 2012-03-23 LAB — CBC
HCT: 31.2 % — ABNORMAL LOW (ref 36.0–46.0)
Hemoglobin: 10.6 g/dL — ABNORMAL LOW (ref 12.0–15.0)
MCH: 29.8 pg (ref 26.0–34.0)
MCHC: 34 g/dL (ref 30.0–36.0)
MCV: 87.6 fL (ref 78.0–100.0)
Platelets: 181 10*3/uL (ref 150–400)
RBC: 3.56 MIL/uL — ABNORMAL LOW (ref 3.87–5.11)
RDW: 15.7 % — ABNORMAL HIGH (ref 11.5–15.5)
WBC: 8.9 10*3/uL (ref 4.0–10.5)

## 2012-03-23 MED ORDER — TECHNETIUM TC 99M TETROFOSMIN IV KIT
30.0000 | PACK | Freq: Once | INTRAVENOUS | Status: AC | PRN
  Administered 2012-03-23: 30 via INTRAVENOUS

## 2012-03-23 MED ORDER — AMLODIPINE BESYLATE 5 MG PO TABS: 5.0000 mg | ORAL_TABLET | Freq: Every day | ORAL | Status: DC

## 2012-03-23 MED ORDER — TECHNETIUM TC 99M TETROFOSMIN IV KIT
10.0000 | PACK | Freq: Once | INTRAVENOUS | Status: AC | PRN
  Administered 2012-03-23: 10 via INTRAVENOUS

## 2012-03-23 MED ORDER — ASPIRIN 81 MG PO TBEC: 81.0000 mg | DELAYED_RELEASE_TABLET | Freq: Every day | ORAL | Status: AC

## 2012-03-23 NOTE — Progress Notes (Signed)
Pt a/o x3, pt being D/C'd home, leaving via wheelchair with mother, follow up appointments and prescriptions given to pt, D/C instructions given, pt verbalized understanding, pt has no s/s of distress. Worthy Flank, RN

## 2012-03-23 NOTE — Discharge Summary (Signed)
Christy Waller MRN: [id number] DOB/AGE: 1970-11-17 41 y.o.  Admit date: 03/21/2012 Discharge date: 03/23/2012  Primary Care Physician: Pt does not have a Primary Care Physician.   Discharge Diagnoses:   Patient Active Problem List  Diagnosis  . Chest pain  . Shortness of breath  . Tobacco abuse  . Bradycardia  . HTN (hypertension)    DISCHARGE MEDICATION: Medication List  As of 03/23/2012  6:31 PM   TAKE these medications         amLODipine 5 MG tablet   Commonly known as: NORVASC   Take 1 tablet (5 mg total) by mouth daily.      aspirin 81 MG EC tablet   Take 1 tablet (81 mg total) by mouth daily.              Consults: Treatment Team:  Hilary Hertz, MD [facility], MD   SIGNIFICANT DIAGNOSTIC STUDIES:  Dg Chest 2 View  03/21/2012  *RADIOLOGY REPORT*  Clinical Data: Chest pain, shortness of breath  CHEST - 2 VIEW  Comparison: None.  Findings: Mild cardiomegaly.  Lungs clear.  No effusion.  Mild dextroscoliosis of the thoracic spine without evident vertebral anomaly.  IMPRESSION:  1.  Mild cardiomegaly  Original Report Authenticated By: Osa Craver, M.D.   Ct Angio Chest Pe W/cm &/or Wo Cm  03/22/2012  *RADIOLOGY REPORT*  Clinical Data: Chest pain.  CT ANGIOGRAPHY CHEST  Technique:  Multidetector CT imaging of the chest using the standard protocol during bolus administration of intravenous contrast. Multiplanar reconstructed images including MIPs were obtained and reviewed to evaluate the vascular anatomy.  Contrast: OMNIPAQUE IOHEXOL 350 MG/ML SOLN  Comparison: None.  Findings: Technically adequate study with good opacification of the central and segmental pulmonary arteries.  No focal filling defect. No evidence of significant pulmonary embolus.  Mild cardiac enlargement with retrograde flow of contrast material into the hepatic veins suggesting passive congestion.  Normal caliber thoracic aorta.  No significant lymphadenopathy in the chest.  The  esophagus is decompressed.  Mild dependent changes in the lung bases.  No focal airspace consolidation or edema.  Airways appear patent.  No pneumothorax.  Mild degenerative changes in the cervical spine with normal alignment.  IMPRESSION: No evidence of significant pulmonary embolus.  Original Report Authenticated By: Marlon Pel, M.D.   Nm Myocar Multi W/spect W/wall Motion / Ef  03/23/2012  *RADIOLOGY REPORT*  Clinical Data:  Chest pain  MYOCARDIAL IMAGING WITH SPECT (REST AND EXERCISE) GATED LEFT VENTRICULAR WALL MOTION STUDY LEFT VENTRICULAR EJECTION FRACTION  Technique:  Standard myocardial SPECT imaging was performed after resting intravenous injection of  10 mCi Tc-43m tetrofosmin. Subsequently, exercise tolerance test was performed by the patient under the supervision of the Cardiology staff.  At peak-stress, 30 mCi Tc-60m tetrofosminwas injected intravenously and standard myocardial SPECT imaging was performed.  Quantitative gated imaging was also performed to evaluate left ventricular wall motion, and estimate left ventricular ejection fraction.  Comparison:  None.  Findings: SPECT imaging demonstrates no reversible or irreversible defects to suggest ischemia or infarct.  Quantitative gated analysis shows normal wall motion.  The resting left ventricular ejection fraction is 50% with end- diastolic volume of 94 ml and end-systolic volume of 47 ml.  IMPRESSION: No evidence of ischemia or infarction.  Ejection fraction 50%.  Original Report Authenticated By: Cyndie Chime, M.D.     ECHO: - Left ventricle: Posterior basal hypokinesis The cavity size was normal. Wall thickness was normal. Systolic function  was normal. The estimated ejection fraction was 50%. Wall motion was normal; there were no regional wall motion abnormalities. - Left atrium: The atrium was mildly dilated. - Atrial septum: No defect or patent foramen ovale was identified.      OTHER PROCEDURES: NM Myocar Multi  W/Spect W/Wall Motion / EF: SPECT imaging demonstrates no reversible or irreversible  defects to suggest ischemia or infarct.  Quantitative gated analysis shows normal wall motion.  The resting left ventricular ejection fraction is 50% with end-  diastolic volume of 94 ml and end-systolic volume of 47 ml.   IMPRESSION:  No evidence of ischemia or infarction.  Ejection fraction 50%.    No results found for this or any previous visit (from the past 240 hour(s)).  BRIEF ADMITTING H & P: 41 year-old female with no significant past medical history has been experiencing chest pain and shortness of breath last 2 days. Chest is retrosternal nonradiating increase on inspiration and increases on lying down. Has no relation to exertion. In association patient has shortness of breath which was increased on lying down. Patient has sometimes noticed swelling of the legs. CT angiogram of the chest was negative for PE. Cardiologist was consulted as patient's EKG showed T wave inversion. At this time cardiologist has recommended admission and get echocardiogram and possible stress test. Patient also initially was found to be in sinus bradycardia presently rate is around 50-54 per minute.    Hospital Course:  Present on Admission:  .Chest pain: Pt's chest pain was atypical but in light of her bradycardia and her HTN she was further risk stratified with serial enzymes and underwent a stress test which were all negative for any ischemia. Cholesterol was checked and found to be within normal limits.  .Bradycardia: The etiology for bradycardia was not realized. In light of this and the near syncope  the patient will have a 21-day event monitor and follow up with cardiology.  .Shortness of breath: resolved  .Tobacco abuse: The patient was counseled against further tobacco use.  Marland KitchenHTN (hypertension): Pt was newly  diagnosed with HTN and started on Norvasc. She should avoid rate limiting medications in light of  her bradycardia.  Condition at time of discharge is stable.  Disposition and Follow-up:  Pt is being discharged to home in stable condition and will follow up with [facility] for event monitor and for an appointment on 04/19/2012. Per her Sarajane Marek will make an appointment to see the physician that see's most of her family members.  Discharge Orders    Future Appointments: Provider: Department: Dept Phone: Center:   04/19/2012 11:30 AM Beatrice Lecher, PA [facility] [phone] [facility]     Future Orders Please Complete By Expires   Diet - low sodium heart healthy      Activity as tolerated - No restrictions         DISCHARGE EXAM:  General: Alert, awake, oriented x3, in no acute distress. Vital Signs:Blood pressure 153/89, pulse 51, temperature 98.1 F (36.7 C), temperature source Oral, resp. rate 20, height 5\' 1"  (1.549 m), weight 75.8 kg (167 lb 1.7 oz), last menstrual period 03/09/2012, SpO2 100.00%. HEENT: [place]/AT PEERL, EOMI  Neck: Trachea midline, no masses, no thyromegal,y no JVD, no carotid bruit  OROPHARYNX: Moist, No exudate/ erythema/lesions.  Heart: S1 S2 normal, rubs, gallops, PMI non-displaced, no heaves or thrills on palpation.  Lungs: Clear to auscultation.  Abdomen: Soft, nontender, nondistended, positive bowel sounds, no masses no hepatosplenomegaly noted..  Neuro: No  focal neurological deficits noted. Strength normal in bilateral upper and lower extremities.  Musculoskeletal: No warm swelling or erythema around joints, no spinal tenderness noted.      Basename 03/22/12 0919 03/21/12 1944  NA 139 139  K 3.5 4.4  CL 106 104  CO2 24 27  GLUCOSE 85 92  BUN 11 15  CREATININE 0.66 0.81  CALCIUM 8.6 9.5  MG -- --  PHOS -- --    Basename 03/22/12 0919  AST 16  ALT 12  ALKPHOS 59  BILITOT 0.3  PROT 6.2  ALBUMIN 3.3*   No results found for this basename: LIPASE:2,AMYLASE:2 in the last 72 hours  Basename 03/23/12 0500 03/22/12 0919  WBC  8.9 6.5  NEUTROABS -- 4.2  HGB 10.6* 10.9*  HCT 31.2* 32.3*  MCV 87.6 87.5  PLT 181 204   Total time involved in discharge process including face to face time greater than thirty minutes. Signed: MATTHEWS,MICHELLE A. 03/23/2012, 6:31 PM

## 2012-03-23 NOTE — Progress Notes (Signed)
Patient: Christy Waller Date of Encounter: 03/23/2012, 7:26 AM Admit date: 03/21/2012     Subjective  No further CP; some dizziness with standing. No syncope.  2D Echo as reviewed done today:  - Left ventricle: Posterior basal hypokinesis The cavity size was normal. Wall thickness was normal. Systolic function was normal. The estimated ejection fraction was 50%. Wall motion was normal; there were no regional wall motion abnormalities. - Left atrium: The atrium was mildly dilated. - Atrial septum: No defect or patent foramen ovale was identified.   Objective   Telemetry: sinus bradycardia HR mostly 40s, occ upper 30's/50s, no evidence of heart block or prolonged pauses Physical Exam: Filed Vitals:   03/22/12 1018  BP: 160/86  Pulse: 43  Temp: 97.7  Resp: 20   General: Well developed, well nourished AAF in no acute distress. Head: Normal Neck: Supple Lungs: CTA Heart: Regular rhythm, bradycardic Abdomen: Soft, non-tender, non-distended  Extremities:  No edema.   Neuro: Alert and oriented X 3. Moves all extremities spontaneously.    Intake/Output Summary (Last 24 hours) at 03/23/12 0726 Last data filed at 03/23/12 1610  Gross per 24 hour  Intake    463 ml  Output    850 ml  Net   -387 ml    Inpatient Medications:     . amLODipine  5 mg Oral Daily  . aspirin EC  81 mg Oral Daily  . enoxaparin (LOVENOX) injection  40 mg Subcutaneous Q24H  . pneumococcal 23 valent vaccine  0.5 mL Intramuscular Once  . sodium chloride  3 mL Intravenous Q12H  . sodium chloride  3 mL Intravenous Q12H  . DISCONTD: aspirin EC  325 mg Oral Daily    Labs:  [facility] 03/22/12 0919 03/21/12 1944  NA 139 139  K 3.5 4.4  CL 106 104  CO2 24 27  GLUCOSE 85 92  BUN 11 15  CREATININE 0.66 0.81  CALCIUM 8.6 9.5  MG -- --  PHOS -- --    Basename 03/22/12 0919  AST 16  ALT 12  ALKPHOS 59  BILITOT 0.3  PROT 6.2  ALBUMIN 3.3*    Basename 03/23/12 0500 03/22/12 0919  WBC 8.9 6.5    NEUTROABS -- 4.2  HGB 10.6* 10.9*  HCT 31.2* 32.3*  MCV 87.6 87.5  PLT 181 204    Basename 03/22/12 2323 03/22/12 1619 03/22/12 1118 03/22/12 0919  CKTOTAL 53 52 59 56  CKMB 1.6 1.7 1.6 1.4  TROPONINI <0.30 <0.30 <0.30 <0.30   pBNP 136  Basename 03/21/12 2346  DDIMER 0.67*   Radiology/Studies:  Dg Chest 2 View 03/21/2012  *RADIOLOGY REPORT*  Clinical Data: Chest pain, shortness of breath  CHEST - 2 VIEW  Comparison: None.  Findings: Mild cardiomegaly.  Lungs clear.  No effusion.  Mild dextroscoliosis of the thoracic spine without evident vertebral anomaly.  IMPRESSION:  1.  Mild cardiomegaly  Original Report Authenticated By: Osa Craver, M.D.   Ct Angio Chest Pe W/cm &/or Wo Cm 03/22/2012  *RADIOLOGY REPORT*  Clinical Data: Chest pain.  CT ANGIOGRAPHY CHEST  Technique:  Multidetector CT imaging of the chest using the standard protocol during bolus administration of intravenous contrast. Multiplanar reconstructed images including MIPs were obtained and reviewed to evaluate the vascular anatomy.  Contrast: OMNIPAQUE IOHEXOL 350 MG/ML SOLN  Comparison: None.  Findings: Technically adequate study with good opacification of the central and segmental pulmonary arteries.  No focal filling defect. No evidence of significant pulmonary embolus.  Mild cardiac enlargement with retrograde flow of contrast material into the hepatic veins suggesting passive congestion.  Normal caliber thoracic aorta.  No significant lymphadenopathy in the chest.  The esophagus is decompressed.  Mild dependent changes in the lung bases.  No focal airspace consolidation or edema.  Airways appear patent.  No pneumothorax.  Mild degenerative changes in the cervical spine with normal alignment.  IMPRESSION: No evidence of significant pulmonary embolus.  Original Report Authenticated By: Marlon Pel, M.D.    Assessment and Plan   1. Chest pain/SOB - enzymes neg, CTA neg for PE, symptoms atypical. Echo  showed posterior basal hypokinesis, EF 50%. Plan myoview today both to exclude ischemia and evaluate chronotropic competence. 2. Sinus bradycardia - Cause is unclear. Not on any AV blocking agents prior to admit. TSH normal. Functional study to evaluate chronotropic competence. 3. Normocytic anemia - Eval per primary care. 4. HTN, not currently well controlled. Continue amlodipine and increase as needed; no AV nodal blocking agents. If myoview neg and HR increases with exercise, patient can be DCed and FU with Dr Riley Kill for cardiac issues. Other issues per primary care.  Signed, Olga Millers MD 7:33 AM

## 2012-03-23 NOTE — Progress Notes (Signed)
Nuclear scan being performed. Pt walked on treadmill with appropriate HR response with max HR 157 (target 151 for injection of Myoview). Some non-limiting chest pressure. SOB towards end of walk but overall good exercise tolerance. Await images. Michalla Ringer PA-C

## 2012-03-23 NOTE — Progress Notes (Addendum)
Nuclear study reviewed - no ischemia/infarction, EF 50%. Reviewed with Dr. Jens Som. OK to D/C with followup in our office - she will see Tereso Newcomer on a day when Dr. Riley Kill is in the office. We will also call her to schedule a 21-day event monitor given her presyncope episodes/sinus bradycardia. I discussed results with her. She knows to call in the interim if any problems developed. Also sent page to Dr. Ashley Royalty making her aware of our plan. See previous note re: HR -- she was able to get up to 152 on treadmill so no evidence of chronotropic incompetence. Mikaylah Libbey PA-C

## 2012-03-24 ENCOUNTER — Encounter (INDEPENDENT_AMBULATORY_CARE_PROVIDER_SITE_OTHER): Payer: 59

## 2012-03-24 DIAGNOSIS — I498 Other specified cardiac arrhythmias: Secondary | ICD-10-CM

## 2012-04-19 ENCOUNTER — Ambulatory Visit (INDEPENDENT_AMBULATORY_CARE_PROVIDER_SITE_OTHER): Payer: Self-pay | Admitting: Physician Assistant

## 2012-04-19 ENCOUNTER — Encounter: Payer: Self-pay | Admitting: Physician Assistant

## 2012-04-19 VITALS — BP 140/90 | HR 55 | Ht 61.0 in | Wt 168.8 lb

## 2012-04-19 DIAGNOSIS — R0609 Other forms of dyspnea: Secondary | ICD-10-CM

## 2012-04-19 DIAGNOSIS — R0683 Snoring: Secondary | ICD-10-CM

## 2012-04-19 DIAGNOSIS — I498 Other specified cardiac arrhythmias: Secondary | ICD-10-CM

## 2012-04-19 DIAGNOSIS — R001 Bradycardia, unspecified: Secondary | ICD-10-CM

## 2012-04-19 DIAGNOSIS — R079 Chest pain, unspecified: Secondary | ICD-10-CM

## 2012-04-19 DIAGNOSIS — I10 Essential (primary) hypertension: Secondary | ICD-10-CM

## 2012-04-19 DIAGNOSIS — D649 Anemia, unspecified: Secondary | ICD-10-CM

## 2012-04-19 LAB — FERRITIN: Ferritin: 8.9 ng/mL — ABNORMAL LOW (ref 10.0–291.0)

## 2012-04-19 LAB — IRON AND TIBC
%SAT: 54 % (ref 20–55)
Iron: 198 ug/dL — ABNORMAL HIGH (ref 42–145)
TIBC: 364 ug/dL (ref 250–470)
UIBC: 166 ug/dL (ref 125–400)

## 2012-04-19 LAB — IRON: Iron: 192 ug/dL — ABNORMAL HIGH (ref 42–145)

## 2012-04-19 LAB — VITAMIN B12: Vitamin B-12: 373 pg/mL (ref 211–911)

## 2012-04-19 MED ORDER — AMLODIPINE BESYLATE 5 MG PO TABS: 5.0000 mg | ORAL_TABLET | Freq: Every day | ORAL | Status: DC

## 2012-04-19 NOTE — Progress Notes (Signed)
[address] [place], [place]  [address] Phone: [phone] Fax:  [phone]  Date:  04/19/2012   Name:  Christy Waller   DOB:  06-Jan-1971   MRN:  [id number]  PCP:  Sheila Oats, MD  Primary Cardiologist:  Dr.  Shawnie Pons  Primary Electrophysiologist:  None    History of Present Illness: Christy Waller is a 42 y.o. female who returns for post hospital follow up.  She has a history of HTN and was admitted 8/6-8/8 with chest pain and shortness of breath. Cardiac markers were negative. 2-D echocardiogram 03/22/12: EF 50%, mild LAE, no wall motion abnormalities. Myoview 03/23/12: EF 50%, no ischemia or scar. Chest CT angiogram 03/22/12: Negative for pulmonary embolism. Patient was noted to have sinus bradycardia with heart rates down into the 40s. She was set up for a 21 day event monitor. I just reviewed that and it demonstrates sinus rhythm, sinus bradycardia, sinus tachycardia and occasional PVCs. No significant bradyarrhythmias noted. Of note, the patient was able to get her heart rate up to 152 on the treadmill for her Myoview ruling out chronotropic incompetence.  Of note, labs from the hospital did demonstrate normocytic anemia with a hemoglobin of 10.6. Today, she notes that she is doing well. Denies any further chest pain, shortness of breath, syncope, orthopnea, PND or edema.  Wt Readings from Last 3 Encounters:  04/19/12 168 lb 12.8 oz (76.567 kg)  03/23/12 167 lb 1.7 oz (75.8 kg)     Past Medical History  Diagnosis Date  . Tubal pregnancy     In her late 20's  . Chest pain 03/22/2012    Normal Myoview, normal echo, chest CT negative for pulmonary embolism  . Anemia   . HTN (hypertension)     2-D echocardiogram 03/22/12: EF 50%, mild LAE, no wall motion abnormalities.  . History of nuclear stress test 03/2012    Myoview 03/23/12: EF 50%, no ischemia or scar.  . Bradycardia     Event monitor 8/13: NSR, sinus bradycardia, sinus tachycardia, no  significant bradycardia arrhythmias    Current Outpatient Prescriptions  Medication Sig Dispense Refill  . amLODipine (NORVASC) 5 MG tablet Take 1 tablet (5 mg total) by mouth daily.  30 tablet  0  . aspirin EC 81 MG EC tablet Take 1 tablet (81 mg total) by mouth daily.  30 tablet  0    Allergies: No Known Allergies  History  Substance Use Topics  . Smoking status: Former Smoker -- 0.2 packs/day for 25 years    Types: Cigarettes    Quit date: 03/21/2012  . Smokeless tobacco: Never Used  . Alcohol Use: Yes     occasional     ROS:  Please see the history of present illness.   She does have a history of snoring. She does have some symptoms of daytime hypersomnolence. She is taking all her medications. She denies any indigestion, dysphagia or odynophagia.  All other systems reviewed and negative.   PHYSICAL EXAM: VS:  BP 140/90  Pulse 55  Ht 5\' 1"  (1.549 m)  Wt 168 lb 12.8 oz (76.567 kg)  BMI 31.89 kg/m2  LMP 03/09/2012 Well nourished, well developed, in no acute distress HEENT: normal Neck: no JVD Cardiac:  normal S1, S2; RRR; no murmur Lungs:  clear to auscultation bilaterally, no wheezing, rhonchi or rales Abd: soft, nontender, no hepatomegaly Ext: no edema Skin: warm and dry Neuro:  CNs 2-12 intact, no focal abnormalities noted  EKG:  Sinus  bradycardia, heart rate 55, leftward axis, no acute changes      ASSESSMENT AND PLAN:  1. Chest Pain: Resolved. Cardiac workup unremarkable. No further cardiovascular testing at this time. She may use Pepcid as needed.  2. Hypertension: Borderline control. Continue current dose of amlodipine for now. Continue with therapeutic lifestyle modifications. Followup with me in 3-4 months. She is currently Medicaid pending. When she has a primary care provider she can followup with cardiology when necessary.  3. Snoring: She has a history of bradycardia and borderline control of blood pressure. Question if she has sleep apnea. Arrange  sleep study to further evaluate.  4. Normocytic Anemia: She has a long history of this. Obtain iron studies, B12 and folate. She can followup with her new PCP when she is established.  5. Bradycardia: No significant bradycardia arrhythmias on her event monitor. She did achieve a heart rate over 150 during her nuclear study. Proceed with sleep study as noted. No further cardiac workup at this time.  Luna Glasgow, PA-C  12:02 PM 04/19/2012

## 2012-04-19 NOTE — Patient Instructions (Addendum)
You can get Pepcid OTC and take twice daily as needed for indigestion. Sent your refill in to [facility] on [address]  Your physician recommends that you schedule a follow-up appointment in: 3-4 months with Tereso Newcomer, PA-C  Your physician recommends that you have lab work today: Iron panel  Your physician has recommended that you have a sleep study. This test records several body functions during sleep, including: brain activity, eye movement, oxygen and carbon dioxide blood levels, heart rate and rhythm, breathing rate and rhythm, the flow of air through your mouth and nose, snoring, body muscle movements, and chest and belly movement.  DX: SNORING  If you have any questions or concerns you can call our office at [phone]

## 2012-04-20 ENCOUNTER — Telehealth: Payer: Self-pay | Admitting: *Deleted

## 2012-04-20 DIAGNOSIS — D649 Anemia, unspecified: Secondary | ICD-10-CM

## 2012-04-20 NOTE — Telephone Encounter (Signed)
[facility] labs called and said they did not rcv RBC Folate test. I s/w pt and come in 9/6 for RBC Folate

## 2012-04-21 ENCOUNTER — Other Ambulatory Visit (INDEPENDENT_AMBULATORY_CARE_PROVIDER_SITE_OTHER): Payer: Self-pay

## 2012-04-21 DIAGNOSIS — R0989 Other specified symptoms and signs involving the circulatory and respiratory systems: Secondary | ICD-10-CM

## 2012-04-21 DIAGNOSIS — D649 Anemia, unspecified: Secondary | ICD-10-CM

## 2012-04-21 LAB — VITAMIN B12: Vitamin B-12: 405 pg/mL (ref 211–911)

## 2012-04-21 LAB — FERRITIN: Ferritin: 13 ng/mL (ref 10–291)

## 2012-04-24 ENCOUNTER — Telehealth: Payer: Self-pay | Admitting: *Deleted

## 2012-04-24 ENCOUNTER — Other Ambulatory Visit: Payer: Self-pay

## 2012-04-24 DIAGNOSIS — D619 Aplastic anemia, unspecified: Secondary | ICD-10-CM

## 2012-04-24 NOTE — Telephone Encounter (Signed)
pt notified about lab results and that 04/21/12 lab for RBC folate was drawn wrong and pt needs to come back today, pt states ok. I told pt that I will speak w/billing dept to make sure she is not charged for lab today

## 2012-04-24 NOTE — Telephone Encounter (Signed)
Message copied by Tarri Fuller on Mon Apr 24, 2012 11:21 AM ------      Message from: [place], [place] T      Created: Thu Apr 20, 2012  8:29 AM       [employer] stores      Start Ferrous Sulfate 325 mg BID for 3 mos.      B12 ok      ##Folate Pending##      Follow up with new PCP when established for anemia.      Tereso Newcomer, PA-C  8:28 AM 04/20/2012

## 2012-04-25 ENCOUNTER — Other Ambulatory Visit (INDEPENDENT_AMBULATORY_CARE_PROVIDER_SITE_OTHER): Payer: Self-pay

## 2012-04-25 DIAGNOSIS — R0989 Other specified symptoms and signs involving the circulatory and respiratory systems: Secondary | ICD-10-CM

## 2012-04-26 ENCOUNTER — Other Ambulatory Visit: Payer: Self-pay | Admitting: *Deleted

## 2012-04-26 DIAGNOSIS — D649 Anemia, unspecified: Secondary | ICD-10-CM

## 2012-04-27 ENCOUNTER — Telehealth: Payer: Self-pay | Admitting: Physician Assistant

## 2012-04-27 NOTE — Telephone Encounter (Signed)
New Problem:  Calling about the patient's labs  Assestion #-[id number]

## 2012-04-27 NOTE — Telephone Encounter (Signed)
I cb [facility] lab s/w rep and she proceeded to instruct me as to which tubes were needed and what we needed to do with tubes. I told rep that we were now going to let the pt go [facility] @ [address] 05/01/12, Rep closed out  ticket, I will fax over order to [facility] @ [address] , [place], [place] @ [phone].   

## 2012-04-27 NOTE — Telephone Encounter (Signed)
I cb [facility] lab s/w rep and she proceeded to instruct me as to which tubes were needed and what we needed to do with tubes. I told rep that we were now going to let the pt go [facility] @ [address] 05/01/12, Rep closed out  ticket, I will fax over order to [facility] [address] , [place], [place] @ [phone].

## 2012-05-01 ENCOUNTER — Other Ambulatory Visit: Payer: Self-pay | Admitting: Physician Assistant

## 2012-05-02 LAB — FOLATE RBC: RBC Folate: 684 ng/mL (ref >=366)

## 2012-05-04 ENCOUNTER — Telehealth: Payer: Self-pay | Admitting: *Deleted

## 2012-05-04 NOTE — Telephone Encounter (Signed)
pt notified about lab results w/verbal understanding 

## 2012-05-04 NOTE — Telephone Encounter (Signed)
Message copied by Tarri Fuller on Thu May 04, 2012  3:11 PM ------      Message from: [place], [place] T      Created: Wed May 03, 2012  1:42 PM       [place], [place]  1:42 PM 05/03/2012

## 2012-05-09 ENCOUNTER — Encounter (HOSPITAL_BASED_OUTPATIENT_CLINIC_OR_DEPARTMENT_OTHER): Payer: Self-pay

## 2012-07-19 ENCOUNTER — Ambulatory Visit: Payer: Self-pay | Admitting: Physician Assistant

## 2013-01-01 ENCOUNTER — Other Ambulatory Visit (HOSPITAL_COMMUNITY)
Admission: RE | Admit: 2013-01-01 | Discharge: 2013-01-01 | Disposition: A | Discharge status: Home / Self Care | Payer: BC Managed Care – PPO | Source: Ambulatory Visit | Attending: Internal Medicine | Admitting: Internal Medicine

## 2013-01-01 DIAGNOSIS — Z01419 Encounter for gynecological examination (general) (routine) without abnormal findings: Secondary | ICD-10-CM | POA: Insufficient documentation

## 2013-03-14 ENCOUNTER — Other Ambulatory Visit (HOSPITAL_COMMUNITY): Payer: Self-pay | Admitting: Internal Medicine

## 2013-03-14 DIAGNOSIS — D219 Benign neoplasm of connective and other soft tissue, unspecified: Secondary | ICD-10-CM

## 2013-03-19 ENCOUNTER — Other Ambulatory Visit (HOSPITAL_COMMUNITY): Payer: Self-pay | Admitting: *Deleted

## 2013-03-19 ENCOUNTER — Ambulatory Visit (HOSPITAL_COMMUNITY)
Admission: RE | Admit: 2013-03-19 | Discharge: 2013-03-19 | Disposition: A | Discharge status: Home / Self Care | Payer: BC Managed Care – PPO | Source: Ambulatory Visit | Attending: Internal Medicine | Admitting: Internal Medicine

## 2013-03-19 DIAGNOSIS — N854 Malposition of uterus: Secondary | ICD-10-CM | POA: Insufficient documentation

## 2013-03-19 DIAGNOSIS — D219 Benign neoplasm of connective and other soft tissue, unspecified: Secondary | ICD-10-CM

## 2013-03-19 DIAGNOSIS — I10 Essential (primary) hypertension: Secondary | ICD-10-CM | POA: Insufficient documentation

## 2013-03-19 DIAGNOSIS — Z1231 Encounter for screening mammogram for malignant neoplasm of breast: Secondary | ICD-10-CM

## 2013-03-19 DIAGNOSIS — D259 Leiomyoma of uterus, unspecified: Secondary | ICD-10-CM | POA: Insufficient documentation

## 2013-03-20 ENCOUNTER — Encounter: Payer: Self-pay | Admitting: Gastroenterology

## 2013-03-29 ENCOUNTER — Ambulatory Visit (HOSPITAL_COMMUNITY)
Admission: RE | Admit: 2013-03-29 | Discharge: 2013-03-29 | Disposition: A | Discharge status: Home / Self Care | Payer: BC Managed Care – PPO | Source: Ambulatory Visit | Attending: Internal Medicine | Admitting: Internal Medicine

## 2013-03-29 DIAGNOSIS — Z1231 Encounter for screening mammogram for malignant neoplasm of breast: Secondary | ICD-10-CM | POA: Insufficient documentation

## 2013-04-03 ENCOUNTER — Other Ambulatory Visit (HOSPITAL_COMMUNITY): Payer: Self-pay | Admitting: Internal Medicine

## 2013-04-03 ENCOUNTER — Other Ambulatory Visit: Payer: Self-pay | Admitting: *Deleted

## 2013-04-03 DIAGNOSIS — R928 Other abnormal and inconclusive findings on diagnostic imaging of breast: Secondary | ICD-10-CM

## 2013-04-20 ENCOUNTER — Ambulatory Visit
Admission: RE | Admit: 2013-04-20 | Discharge: 2013-04-20 | Disposition: A | Discharge status: Home / Self Care | Payer: BC Managed Care – PPO | Source: Ambulatory Visit | Attending: *Deleted | Admitting: *Deleted

## 2013-04-20 DIAGNOSIS — R928 Other abnormal and inconclusive findings on diagnostic imaging of breast: Secondary | ICD-10-CM

## 2013-04-23 ENCOUNTER — Ambulatory Visit: Payer: BC Managed Care – PPO | Admitting: Gastroenterology

## 2013-04-27 ENCOUNTER — Other Ambulatory Visit: Payer: BC Managed Care – PPO

## 2013-05-31 ENCOUNTER — Ambulatory Visit (INDEPENDENT_AMBULATORY_CARE_PROVIDER_SITE_OTHER): Payer: BC Managed Care – PPO | Admitting: Gastroenterology

## 2013-05-31 ENCOUNTER — Encounter: Payer: Self-pay | Admitting: Gastroenterology

## 2013-05-31 VITALS — BP 124/88 | HR 68 | Ht 60.0 in | Wt 188.0 lb

## 2013-05-31 DIAGNOSIS — D509 Iron deficiency anemia, unspecified: Secondary | ICD-10-CM

## 2013-05-31 MED ORDER — PEG-KCL-NACL-NASULF-NA ASC-C 100 G PO SOLR: 1.0000 | Freq: Once | ORAL | Status: DC

## 2013-05-31 NOTE — Patient Instructions (Signed)
You have been scheduled for a colonoscopy with propofol. Please follow written instructions given to you at your visit today.  Please pick up your prep kit at the pharmacy within the next 1-3 days. If you use inhalers (even only as needed), please bring them with you on the day of your procedure.  Thank you for choosing me and [facility].  Malcolm T. Stark, Jr., MD., FACG    

## 2013-05-31 NOTE — Progress Notes (Signed)
    History of Present Illness: This is a 42 year old female with a fe def anemia diagnosed 1 year ago with ferritin=8.9. Her most recent Hb=9.9, fe sat=6%. She states she was treated with iron in her 24s following a pregnancy. History of heavy periods but none in past 5 years. She relates recent stool Hemoccult testing was negative. No GI complaints. Denies weight loss, abdominal pain, constipation, diarrhea, change in stool caliber, melena, hematochezia, nausea, vomiting, dysphagia, reflux symptoms, chest pain.  Review of Systems: Pertinent positive and negative review of systems were noted in the above HPI section. All other review of systems were otherwise negative.  Current Medications, Allergies, Past Medical History, Past Surgical History, Family History and Social History were reviewed in [employer] record.  Physical Exam: General: Well developed , well nourished, no acute distress Head: Normocephalic and atraumatic Eyes:  sclerae anicteric, EOMI Ears: Normal auditory acuity Mouth: No deformity or lesions Neck: Supple, no masses or thyromegaly Lungs: Clear throughout to auscultation Heart: Regular rate and rhythm; no murmurs, rubs or bruits Abdomen: Soft, non tender and non distended. No masses, hepatosplenomegaly or hernias noted. Normal Bowel sounds Rectal: deferred to colonoscopy, stool was heme negative recently Musculoskeletal: Symmetrical with no gross deformities  Skin: No lesions on visible extremities Pulses:  Normal pulses noted Extremities: No clubbing, cyanosis, edema or deformities noted Neurological: Alert oriented x 4, grossly nonfocal Cervical Nodes:  No significant cervical adenopathy Inguinal Nodes: No significant inguinal adenopathy Psychological:  Alert and cooperative. Normal mood and affect  Assessment and Recommendations:  1. Fe def anemia. R/O occult GI blood loss. The risks, benefits, and alternatives to colonoscopy with possible  biopsy and possible polypectomy were discussed with the patient and they consent to proceed.

## 2013-06-01 ENCOUNTER — Encounter: Payer: Self-pay | Admitting: Gastroenterology

## 2013-06-26 ENCOUNTER — Encounter: Payer: Self-pay | Admitting: Gastroenterology

## 2013-06-26 ENCOUNTER — Ambulatory Visit (AMBULATORY_SURGERY_CENTER): Payer: BC Managed Care – PPO | Admitting: Gastroenterology

## 2013-06-26 VITALS — BP 137/85 | HR 58 | Temp 97.9°F | Resp 20 | Ht 60.0 in | Wt 188.0 lb

## 2013-06-26 DIAGNOSIS — D509 Iron deficiency anemia, unspecified: Secondary | ICD-10-CM

## 2013-06-26 DIAGNOSIS — D126 Benign neoplasm of colon, unspecified: Secondary | ICD-10-CM

## 2013-06-26 LAB — HM COLONOSCOPY

## 2013-06-26 MED ORDER — SODIUM CHLORIDE 0.9 % IV SOLN: 500.0000 mL | INTRAVENOUS | Status: DC

## 2013-06-26 NOTE — Progress Notes (Signed)
Lidocaine-40mg IV prior to Propofol InductionPropofol given over incremental dosages 

## 2013-06-26 NOTE — Op Note (Signed)
[facility] [address] [place], [address]   COLONOSCOPY PROCEDURE REPORT  PATIENT: Christy, Waller  MR#: [id number] BIRTHDATE: 01/07/1971 , 41  yrs. old GENDER: Female ENDOSCOPIST: Meryl Dare, MD, [facility] REFERRED XB:[id number] Oneta Rack, M.D. PROCEDURE DATE:  06/26/2013 PROCEDURE:   Colonoscopy with snare polypectomy First Screening Colonoscopy - Avg.  risk and is 50 yrs.  old or older - No.  Prior Negative Screening - Now for repeat screening. N/A  History of Adenoma - Now for follow-up colonoscopy & has been > or = to 3 yrs.  N/A  Polyps Removed Today? Yes. ASA CLASS:   Class II INDICATIONS:Iron Deficiency Anemia. MEDICATIONS: MAC sedation, administered by CRNA and propofol (Diprivan) 250mg  IV DESCRIPTION OF PROCEDURE:   After the risks benefits and alternatives of the procedure were thoroughly explained, informed consent was obtained.  A digital rectal exam revealed no abnormalities of the rectum.   The LB [id number]  endoscope was introduced through the anus and advanced to the cecum, which was identified by both the appendix and ileocecal valve. No adverse events experienced.   The quality of the prep was good, using MoviPrep  The instrument was then slowly withdrawn as the colon was fully examined.  COLON FINDINGS: A sessile polyp measuring 5 mm in size was found in the sigmoid colon.  A polypectomy was performed with a cold snare. The resection was complete and the polyp tissue was completely retrieved.   The colon was otherwise normal.  There was no diverticulosis, inflammation, polyps or cancers unless previously stated.  Retroflexed views revealed no abnormalities. The time to cecum=2 minutes 08 seconds.  Withdrawal time=8 minutes 15 seconds. The scope was withdrawn and the procedure completed.  COMPLICATIONS: There were no complications.  ENDOSCOPIC IMPRESSION: 1.   Sessile polyp measuring 5 mm in the sigmoid colon;  polypectomy performed with a cold snare 2.   The colon was otherwise normal  RECOMMENDATIONS: 1.  Await pathology results 2.  Repeat colonoscopy in 5 years if polyp adenomatous; otherwise 10 years  eSigned:  Meryl Dare, MD, [facility] 06/26/2013 2:51 PM

## 2013-06-26 NOTE — Progress Notes (Signed)
Called to room to assist during endoscopic procedure.  Patient ID and intended procedure confirmed with present staff. Received instructions for my participation in the procedure from the performing physician.  

## 2013-06-26 NOTE — Progress Notes (Signed)
Patient did not experience any of the following events: a burn prior to discharge; a fall within the facility; wrong site/side/patient/procedure/implant event; or a hospital transfer or hospital admission upon discharge from the facility. (G8907) Patient did not have preoperative order for IV antibiotic SSI prophylaxis. (G8918)  

## 2013-06-26 NOTE — Patient Instructions (Signed)
Discharge instructions given with verbal understanding. Handout on polyps. Resume previous medications. YOU HAD AN ENDOSCOPIC PROCEDURE TODAY AT THE [facility]: Refer to the procedure report that was given to you for any specific questions about what was found during the examination.  If the procedure report does not answer your questions, please call your gastroenterologist to clarify.  If you requested that your care partner not be given the details of your procedure findings, then the procedure report has been included in a sealed envelope for you to review at your convenience later.  YOU SHOULD EXPECT: Some feelings of bloating in the abdomen. Passage of more gas than usual.  Walking can help get rid of the air that was put into your GI tract during the procedure and reduce the bloating. If you had a lower endoscopy (such as a colonoscopy or flexible sigmoidoscopy) you may notice spotting of blood in your stool or on the toilet paper. If you underwent a bowel prep for your procedure, then you may not have a normal bowel movement for a few days.  DIET: Your first meal following the procedure should be a light meal and then it is ok to progress to your normal diet.  A half-sandwich or bowl of soup is an example of a good first meal.  Heavy or fried foods are harder to digest and may make you feel nauseous or bloated.  Likewise meals heavy in dairy and vegetables can cause extra gas to form and this can also increase the bloating.  Drink plenty of fluids but you should avoid alcoholic beverages for 24 hours.  ACTIVITY: Your care partner should take you home directly after the procedure.  You should plan to take it easy, moving slowly for the rest of the day.  You can resume normal activity the day after the procedure however you should NOT DRIVE or use heavy machinery for 24 hours (because of the sedation medicines used during the test).    SYMPTOMS TO REPORT IMMEDIATELY: A  gastroenterologist can be reached at any hour.  During normal business hours, 8:30 AM to 5:00 PM Monday through Friday, call [phone].  After hours and on weekends, please call the GI answering service at [phone] who will take a message and have the physician on call contact you.   Following lower endoscopy (colonoscopy or flexible sigmoidoscopy):  Excessive amounts of blood in the stool  Significant tenderness or worsening of abdominal pains  Swelling of the abdomen that is new, acute  Fever of 100F or higher  FOLLOW UP: If any biopsies were taken you will be contacted by phone or by letter within the next 1-3 weeks.  Call your gastroenterologist if you have not heard about the biopsies in 3 weeks.  Our staff will call the home number listed on your records the next business day following your procedure to check on you and address any questions or concerns that you may have at that time regarding the information given to you following your procedure. This is a courtesy call and so if there is no answer at the home number and we have not heard from you through the emergency physician on call, we will assume that you have returned to your regular daily activities without incident.  SIGNATURES/CONFIDENTIALITY: You and/or your care partner have signed paperwork which will be entered into your electronic medical record.  These signatures attest to the fact that that the information above on your After Visit Summary has been   reviewed and is understood.  Full responsibility of the confidentiality of this discharge information lies with you and/or your care-partner. 

## 2013-06-27 ENCOUNTER — Telehealth: Payer: Self-pay | Admitting: *Deleted

## 2013-06-27 NOTE — Telephone Encounter (Signed)
  Follow up Call-  Call back number 06/26/2013  Post procedure Call Back phone  # [phone]  Permission to leave phone message Yes     Patient questions:  Do you have a fever, pain , or abdominal swelling? no Pain Score  0 *  Have you tolerated food without any problems? yes  Have you been able to return to your normal activities? yes  Do you have any questions about your discharge instructions: Diet   no Medications  no Follow up visit  no  Do you have questions or concerns about your Care? no  Actions: * If pain score is 4 or above: No action needed, pain <4.  I want to thank you all so much.

## 2013-07-02 ENCOUNTER — Encounter: Payer: Self-pay | Admitting: Gastroenterology

## 2013-07-02 ENCOUNTER — Other Ambulatory Visit: Payer: Self-pay | Admitting: Emergency Medicine

## 2013-07-02 MED ORDER — HYDROCHLOROTHIAZIDE 25 MG PO TABS: 25.0000 mg | ORAL_TABLET | Freq: Every day | ORAL | Status: DC

## 2013-07-05 ENCOUNTER — Other Ambulatory Visit: Payer: Self-pay | Admitting: Emergency Medicine

## 2014-01-15 ENCOUNTER — Telehealth: Payer: Self-pay | Admitting: Emergency Medicine

## 2014-03-06 ENCOUNTER — Other Ambulatory Visit: Payer: Self-pay | Admitting: Internal Medicine

## 2014-03-27 NOTE — Telephone Encounter (Signed)
No notes

## 2014-04-15 ENCOUNTER — Other Ambulatory Visit: Payer: Self-pay | Admitting: Physician Assistant

## 2014-04-19 MED ORDER — AMLODIPINE BESYLATE 10 MG PO TABS
ORAL_TABLET | ORAL | Status: DC
Start: 2014-04-19 — End: 2016-10-06

## 2014-04-23 DIAGNOSIS — E559 Vitamin D deficiency, unspecified: Secondary | ICD-10-CM | POA: Insufficient documentation

## 2014-04-23 DIAGNOSIS — D649 Anemia, unspecified: Secondary | ICD-10-CM | POA: Insufficient documentation

## 2014-04-24 ENCOUNTER — Ambulatory Visit: Payer: Self-pay | Admitting: Physician Assistant

## 2016-09-27 ENCOUNTER — Encounter (HOSPITAL_COMMUNITY): Payer: Self-pay

## 2016-09-27 DIAGNOSIS — E119 Type 2 diabetes mellitus without complications: Secondary | ICD-10-CM | POA: Insufficient documentation

## 2016-09-27 DIAGNOSIS — I10 Essential (primary) hypertension: Secondary | ICD-10-CM | POA: Insufficient documentation

## 2016-09-27 DIAGNOSIS — Z79899 Other long term (current) drug therapy: Secondary | ICD-10-CM | POA: Insufficient documentation

## 2016-09-27 DIAGNOSIS — Z87891 Personal history of nicotine dependence: Secondary | ICD-10-CM | POA: Insufficient documentation

## 2016-09-27 DIAGNOSIS — R55 Syncope and collapse: Secondary | ICD-10-CM | POA: Insufficient documentation

## 2016-09-27 DIAGNOSIS — Z7982 Long term (current) use of aspirin: Secondary | ICD-10-CM | POA: Insufficient documentation

## 2016-09-27 LAB — BASIC METABOLIC PANEL
Anion gap: 9 (ref 5–15)
BUN: 16 mg/dL (ref 6–20)
CO2: 24 mmol/L (ref 22–32)
Calcium: 9.6 mg/dL (ref 8.9–10.3)
Chloride: 107 mmol/L (ref 101–111)
Creatinine, Ser: 0.74 mg/dL (ref 0.44–1.00)
GFR calc Af Amer: >60 mL/min (ref >60)
GFR calc non Af Amer: >60 mL/min (ref >60)
Glucose, Bld: 98 mg/dL (ref 65–99)
Potassium: 3.4 mmol/L — ABNORMAL LOW (ref 3.5–5.1)
Sodium: 140 mmol/L (ref 135–145)

## 2016-09-27 LAB — CBG MONITORING, ED: Glucose-Capillary: 97 mg/dL (ref 65–99)

## 2016-09-27 LAB — URINALYSIS, ROUTINE W REFLEX MICROSCOPIC
Bilirubin Urine: NEGATIVE
Glucose, UA: NEGATIVE mg/dL
Ketones, ur: NEGATIVE mg/dL
Nitrite: NEGATIVE
Protein, ur: NEGATIVE mg/dL
Specific Gravity, Urine: 1.014 (ref 1.005–1.030)
pH: 5 (ref 5.0–8.0)

## 2016-09-27 LAB — CBC
HCT: 40.5 % (ref 36.0–46.0)
Hemoglobin: 13.6 g/dL (ref 12.0–15.0)
MCH: 28.6 pg (ref 26.0–34.0)
MCHC: 33.6 g/dL (ref 30.0–36.0)
MCV: 85.1 fL (ref 78.0–100.0)
Platelets: 250 10*3/uL (ref 150–400)
RBC: 4.76 MIL/uL (ref 3.87–5.11)
RDW: 14.7 % (ref 11.5–15.5)
WBC: 9.5 10*3/uL (ref 4.0–10.5)

## 2016-09-27 NOTE — ED Triage Notes (Signed)
Pt states that dizziness, SOB and feels like her throat is closing. No redness or swelling noted. Pt states feels like room is spinning.

## 2016-09-28 ENCOUNTER — Emergency Department (HOSPITAL_COMMUNITY)
Admission: EM | Admit: 2016-09-28 | Discharge: 2016-09-28 | Disposition: A | Discharge status: Home / Self Care | Payer: Self-pay | Attending: Emergency Medicine | Admitting: Emergency Medicine

## 2016-09-28 ENCOUNTER — Emergency Department (HOSPITAL_COMMUNITY): Payer: Self-pay

## 2016-09-28 DIAGNOSIS — R55 Syncope and collapse: Secondary | ICD-10-CM

## 2016-09-28 LAB — I-STAT BETA HCG BLOOD, ED (MC, WL, AP ONLY): I-stat hCG, quantitative: <5 m[IU]/mL (ref <5)

## 2016-09-28 LAB — TSH: TSH: 2.425 u[IU]/mL (ref 0.350–4.500)

## 2016-09-28 NOTE — Discharge Instructions (Signed)
FOLLOW UP WITH DR. Melford Aase FOR RECHECK THIS WEEK. RETURN TO THE EMERGENCY ROOM WITH ANY WORSENING SYMPTOMS OR NEW CONCERNS.

## 2016-09-28 NOTE — ED Notes (Signed)
Patient transported to X-ray 

## 2016-09-28 NOTE — ED Provider Notes (Signed)
[facility] DEPT Provider Note   CSN: [id number] Arrival date & time: 09/27/16  2109     History   Chief Complaint Chief Complaint  Patient presents with  . Shortness of Breath  . Near Syncope    HPI Christy Waller is a 46 y.o. female.  Patient presents after a near syncopal episode this evening while at rest. She describes getting lightheaded/dizzy, SOB. She is feeling better now with some mild lightheadedness. No vomiting, headache, chest pain, palpitations. She had a normal day up until onset of symptoms. No recent illness. No fever.   The history is provided by the patient. No language interpreter was used.  Shortness of Breath  Pertinent negatives include no fever, no headaches, no chest pain and no vomiting.  Near Syncope  Associated symptoms include shortness of breath. Pertinent negatives include no chest pain and no headaches.    Past Medical History:  Diagnosis Date  . Anemia   . Bradycardia    Event monitor 8/13: NSR, sinus bradycardia, sinus tachycardia, no significant bradycardia arrhythmias  . Chest pain 03/22/2012   Normal Myoview, normal echo, chest CT negative for pulmonary embolism  . DM (diabetes mellitus) ([place])   . History of nuclear stress test 03/2012   Myoview 03/23/12: EF 50%, no ischemia or scar.  [name] HTN (hypertension)    2-D echocardiogram 03/22/12: EF 50%, mild LAE, no wall motion abnormalities.  . Tubal pregnancy    In her late 20's  . Type II or unspecified type diabetes mellitus without mention of complication, not stated as uncontrolled   . Vitamin D deficiency     Patient Active Problem List   Diagnosis Date Noted  . Anemia   . Vitamin D deficiency   . Normocytic anemia 04/19/2012  . Snoring 04/19/2012  . Chest pain 03/22/2012  . Shortness of breath 03/22/2012  . Tobacco abuse 03/22/2012  . Bradycardia 03/22/2012  . HTN (hypertension) 03/22/2012    Past Surgical History:  Procedure Laterality Date  . CESAREAN SECTION    .  TUBAL LIGATION      OB History    No data available       Home Medications    Prior to Admission medications   Medication Sig Start Date End Date Taking? Authorizing Provider  amLODipine (NORVASC) 10 MG tablet take 1 tablet by mouth once daily 04/19/14   Vicie Mutters, PA-C  aspirin 325 MG tablet Take 325 mg by mouth daily.    Historical Provider, MD  Cholecalciferol (VITAMIN D3) 5000 UNITS TABS Take 2 tablets by mouth daily.    Historical Provider, MD  ferrous sulfate 325 (65 FE) MG tablet Take 325 mg by mouth daily with breakfast.    Historical Provider, MD  hydrochlorothiazide (HYDRODIURIL) 25 MG tablet Take 1 tablet (25 mg total) by mouth daily. 07/02/13   Kelby Aline, PA-C  Magnesium Salicylate XX123456 MG TABS Take 2 tablets by mouth daily.    Historical Provider, MD    Family History Family History  Problem Relation Age of Onset  . Diabetes type II Sister   . Hypertension Mother   . Hypertension Father   . Hypertension Sister     Social History Social History  Substance Use Topics  . Smoking status: Former Smoker    Packs/day: 0.20    Years: 25.00    Types: Cigarettes    Quit date: 03/21/2012  . Smokeless tobacco: Never Used  . Alcohol use No     Comment: stopped 2013  Allergies   Lisinopril   Review of Systems Review of Systems  Constitutional: Negative for chills and fever.  HENT: Positive for trouble swallowing (Brief sensation of throat swelling during near syncopal episode.).   Eyes: Negative for visual disturbance.  Respiratory: Positive for shortness of breath.   Cardiovascular: Positive for near-syncope. Negative for chest pain and palpitations.  Gastrointestinal: Negative.  Negative for vomiting.  Genitourinary: Negative.   Musculoskeletal: Negative.   Skin: Negative.   Neurological: Positive for dizziness, weakness and light-headedness. Negative for syncope, speech difficulty and headaches.     Physical Exam Updated Vital Signs BP (!)  157/103   Pulse (!) 54   Temp 98.5 F (36.9 C) (Oral)   Resp 14   Ht 5\' 1"  (1.549 m)   Wt 79.4 kg   LMP 05/29/2015   SpO2 100%   BMI 33.07 kg/m   Physical Exam  Constitutional: She is oriented to person, place, and time. She appears well-developed and well-nourished. No distress.  HENT:  Head: Normocephalic.  Eyes: Pupils are equal, round, and reactive to light.  Bilateral ptosis.  Neck: Normal range of motion. Neck supple. Thyromegaly present.  Cardiovascular: Normal rate and regular rhythm.   Pulmonary/Chest: Effort normal and breath sounds normal. She has no wheezes. She has no rales.  Abdominal: Soft. Bowel sounds are normal. There is no tenderness. There is no rebound and no guarding.  Musculoskeletal: Normal range of motion.  Neurological: She is alert and oriented to person, place, and time. She has normal strength and normal reflexes. No sensory deficit. She displays a negative Romberg sign. Coordination normal.  Skin: Skin is warm and dry. No rash noted.  Psychiatric: She has a normal mood and affect.     ED Treatments / Results  Labs (all labs ordered are listed, but only abnormal results are displayed) Labs Reviewed  BASIC METABOLIC PANEL - Abnormal; Notable for the following:       Result Value   Potassium 3.4 (*)    All other components within normal limits  URINALYSIS, ROUTINE W REFLEX MICROSCOPIC - Abnormal; Notable for the following:    APPearance HAZY (*)    Hgb urine dipstick LARGE (*)    Leukocytes, UA LARGE (*)    Bacteria, UA MANY (*)    Squamous Epithelial / LPF 0-5 (*)    All other components within normal limits  CBC  CBG MONITORING, ED    EKG  EKG Interpretation None       Radiology Dg Chest 2 View  Result Date: 09/28/2016 CLINICAL DATA:  Acute onset of closing sensation at the throat, and dizziness. Initial encounter. EXAM: CHEST  2 VIEW COMPARISON:  Chest radiograph performed 03/21/2012, and CTA of the chest performed 03/22/2012  FINDINGS: The lungs are well-aerated and clear. There is no evidence of focal opacification, pleural effusion or pneumothorax. The heart is normal in size; the mediastinal contour is within normal limits. No acute osseous abnormalities are seen. IMPRESSION: No acute cardiopulmonary process seen. Electronically Signed   By: Garald Balding M.D.   On: 09/28/2016 01:33    Procedures Procedures (including critical care time)  Medications Ordered in ED Medications - No data to display   Initial Impression / Assessment and Plan / ED Course  I have reviewed the triage vital signs and the nursing notes.  Pertinent labs & imaging results that were available during my care of the patient were reviewed by me and considered in my medical decision making (see chart for  details).     Patient with near syncopal episode and lightheadedness since yesterday. No full syncope. She feels better here over time. Ambulatory without gait disturbance. She feels somewhat dizzy while walking but not off balance and reports it is much better than initial onset.   She is well appearing. EKG bradycardic at 59 only. Labs are reassuring. Urine borderline without symptoms. Culture pending. She states she feels comfortable with discharge home and close PCP follow up. Return precautions discussed.   Final Clinical Impressions(s) / ED Diagnoses   Final diagnoses:  None  1. Near syncope  New Prescriptions New Prescriptions   No medications on file     Charlann Lange, PA-C 09/28/16 [place], DO 09/28/16 0505

## 2016-09-29 LAB — URINE CULTURE

## 2016-10-06 ENCOUNTER — Other Ambulatory Visit (HOSPITAL_COMMUNITY)
Admission: RE | Admit: 2016-10-06 | Discharge: 2016-10-06 | Disposition: A | Discharge status: Home / Self Care | Payer: Self-pay | Source: Ambulatory Visit | Attending: Internal Medicine | Admitting: Internal Medicine

## 2016-10-06 ENCOUNTER — Ambulatory Visit (INDEPENDENT_AMBULATORY_CARE_PROVIDER_SITE_OTHER): Payer: No Typology Code available for payment source | Admitting: Internal Medicine

## 2016-10-06 ENCOUNTER — Encounter: Payer: Self-pay | Admitting: Internal Medicine

## 2016-10-06 VITALS — BP 150/100 | HR 72 | Temp 97.5°F | Resp 16 | Ht 61.0 in | Wt 188.6 lb

## 2016-10-06 DIAGNOSIS — Z0001 Encounter for general adult medical examination with abnormal findings: Secondary | ICD-10-CM

## 2016-10-06 DIAGNOSIS — Z1151 Encounter for screening for human papillomavirus (HPV): Secondary | ICD-10-CM | POA: Insufficient documentation

## 2016-10-06 DIAGNOSIS — N912 Amenorrhea, unspecified: Secondary | ICD-10-CM

## 2016-10-06 DIAGNOSIS — Z79899 Other long term (current) drug therapy: Secondary | ICD-10-CM | POA: Diagnosis not present

## 2016-10-06 DIAGNOSIS — R55 Syncope and collapse: Secondary | ICD-10-CM | POA: Diagnosis not present

## 2016-10-06 DIAGNOSIS — E559 Vitamin D deficiency, unspecified: Secondary | ICD-10-CM | POA: Diagnosis not present

## 2016-10-06 DIAGNOSIS — D509 Iron deficiency anemia, unspecified: Secondary | ICD-10-CM

## 2016-10-06 DIAGNOSIS — I10 Essential (primary) hypertension: Secondary | ICD-10-CM | POA: Diagnosis not present

## 2016-10-06 DIAGNOSIS — Z01419 Encounter for gynecological examination (general) (routine) without abnormal findings: Secondary | ICD-10-CM | POA: Insufficient documentation

## 2016-10-06 DIAGNOSIS — Z124 Encounter for screening for malignant neoplasm of cervix: Secondary | ICD-10-CM

## 2016-10-06 DIAGNOSIS — Z136 Encounter for screening for cardiovascular disorders: Secondary | ICD-10-CM | POA: Diagnosis not present

## 2016-10-06 DIAGNOSIS — F1291 Cannabis use, unspecified, in remission: Secondary | ICD-10-CM

## 2016-10-06 DIAGNOSIS — R0989 Other specified symptoms and signs involving the circulatory and respiratory systems: Secondary | ICD-10-CM

## 2016-10-06 DIAGNOSIS — R6889 Other general symptoms and signs: Secondary | ICD-10-CM

## 2016-10-06 DIAGNOSIS — Z87898 Personal history of other specified conditions: Secondary | ICD-10-CM

## 2016-10-06 DIAGNOSIS — Z13220 Encounter for screening for lipoid disorders: Secondary | ICD-10-CM

## 2016-10-06 DIAGNOSIS — Z720 Tobacco use: Secondary | ICD-10-CM

## 2016-10-06 DIAGNOSIS — Z131 Encounter for screening for diabetes mellitus: Secondary | ICD-10-CM

## 2016-10-06 LAB — CBC WITH DIFFERENTIAL/PLATELET
Basophils Absolute: 0 cells/uL (ref 0–200)
Basophils Relative: 0 %
Eosinophils Absolute: 154 cells/uL (ref 15–500)
Eosinophils Relative: 2 %
HCT: 40.4 % (ref 35.0–45.0)
Hemoglobin: 13.1 g/dL (ref 11.7–15.5)
Lymphocytes Relative: 21 %
Lymphs Abs: 1617 cells/uL (ref 850–3900)
MCH: 28.3 pg (ref 27.0–33.0)
MCHC: 32.4 g/dL (ref 32.0–36.0)
MCV: 87.3 fL (ref 80.0–100.0)
MPV: 9.7 fL (ref 7.5–12.5)
Monocytes Absolute: 539 cells/uL (ref 200–950)
Monocytes Relative: 7 %
Neutro Abs: 5390 cells/uL (ref 1500–7800)
Neutrophils Relative %: 70 %
Platelets: 240 10*3/uL (ref 140–400)
RBC: 4.63 MIL/uL (ref 3.80–5.10)
RDW: 15.5 % — ABNORMAL HIGH (ref 11.0–15.0)
WBC: 7.7 10*3/uL (ref 3.8–10.8)

## 2016-10-06 LAB — HEMOGLOBIN A1C
Hgb A1c MFr Bld: 5.1 % (ref <5.7)
Mean Plasma Glucose: 100 mg/dL

## 2016-10-06 LAB — TSH: TSH: 1.44 mIU/L

## 2016-10-06 MED ORDER — AMLODIPINE BESYLATE 10 MG PO TABS: ORAL_TABLET | ORAL | 2 refills | Status: DC

## 2016-10-06 NOTE — Progress Notes (Signed)
Patient ID: Christy Waller, female   DOB: 06/05/1971, 46 y.o.   MRN: [id number]  Complete Physical  Assessment and Plan:   1. Encounter for general adult medical examination with abnormal findings -discussed the need for mammogram due to maternal aunt who is decease from breast cancer -patient left before getting TDAP, but is likely due for a TDAP -Pap smear updated today  2. Labile hypertension -restart amlodipine 10 mg -keep BP log at home -near syncope could be from PRES vs. Possible arrhythmias.  Will need to get holter monitor and patient requesting to be set back up with cardiology as she has seen the in the past. -EKG without significant change here - Urinalysis, Routine w reflex microscopic - Microalbumin / creatinine urine ratio - EKG 12-Lead - TSH  3. Medication management  - CBC with Differential/Platelet - BASIC METABOLIC PANEL WITH GFR - Hepatic function panel - Magnesium  4. Screening for hyperlipidemia -discussed healthy diet and exercise -can use benefiber in coffee to help with both Cholesterol and constipation.   - Lipid panel  5. Screening for diabetes mellitus  - Hemoglobin A1c - Insulin, random  6. Iron deficiency anemia, unspecified iron deficiency anemia type -has history of anemia -was previously on iron, may need to restart based off labs - Iron and TIBC - Vitamin B12  7. Vitamin D deficiency -start a Vit D supplement - VITAMIN D 25 Hydroxy (Vit-D Deficiency, Fractures)  8. Amenorrhea -likely postmenopausal - Estrogens, total - FSH/LH  9. Screening for cervical cancer  - Cytology - PAP  10. Tobacco abuse -patient has been smoke free for 1 week  11. History of marijuana use -recommended stopping as this can contribute to her issues with dizziness and near syncope   Discussed med's effects and SE's. Screening labs and tests as requested with regular follow-up as recommended.  HPI  46 y.o. female  presents for a complete  physical and reestablishment as a patient.  She reports that she has not been here in about 4 years.    Her blood pressure has not been controlled at home, today their BP is BP: (!) 150/100.  She does not workout. She denies chest pain, shortness of breath, dizziness. She reports that she was seen at the ER on 09/28/16.  She had BP of 195/120.  She reports that she was tested for hyperthyroidism and this was negative.  She reports that she was having some feelings of her heart palpitating and also having some issues with blurry vision at nighttime.  No chest pains.  She does have dizziness sometimes. She does not get any swelling in her legs.  She reports that she has been gaining some weight.  She has been off of blood pressure medications at home.  She is generally running around 130/70-80.  She reports that she has seen a cardiologist before and was told that she might need a pacemaker.  She does not know of any history of strokes or heart attacks that they are aware.  She is in the process of quitting smoking.  She reports that she does occasionally smoke marijuana.  She does not drink alcohol.    She reports that she has not had a period since October.  She reports that prior to that they were pretty regular.  She reports that she has not seen obgyn in a long time.  She has not had a pap smear done recently.  She has never had an abnormal one.  She has not had  a mammogram since 2014.  She is not on cholesterol medication and denies myalgias. Her cholesterol is not at goal. The cholesterol last visit was:  Lab Results  Component Value Date   CHOL 113 03/23/2012   HDL 59 03/23/2012   LDLCALC 41 03/23/2012   TRIG 66 03/23/2012   CHOLHDL 1.9 03/23/2012  .  She is not currently exercising.  She reports that she has been too busy and tired to do it.    She has no other issues currently.  Patient is on Vitamin D supplement.   No results found for: VD25OH     Current Medications:  Current  Outpatient Prescriptions on File Prior to Visit  Medication Sig Dispense Refill  . amLODipine (NORVASC) 10 MG tablet take 1 tablet by mouth once daily (Patient not taking: Reported on 09/28/2016) 30 tablet 0  . hydrochlorothiazide (HYDRODIURIL) 25 MG tablet Take 1 tablet (25 mg total) by mouth daily. (Patient not taking: Reported on 09/28/2016) 30 tablet 0   No current facility-administered medications on file prior to visit.     Health Maintenance:   Immunization History  Administered Date(s) Administered  . Pneumococcal Polysaccharide-23 03/22/2012   TDAP:  Unknown  Patient Care Team: Unk Pinto, MD as PCP - General (Internal Medicine)  Allergies:  Allergies  Allergen Reactions  . Lisinopril     Angioedema     Medical History:  Past Medical History:  Diagnosis Date  . Anemia   . Bradycardia    Event monitor 8/13: NSR, sinus bradycardia, sinus tachycardia, no significant bradycardia arrhythmias  . Chest pain 03/22/2012   Normal Myoview, normal echo, chest CT negative for pulmonary embolism  . DM (diabetes mellitus) ([place])   . History of nuclear stress test 03/2012   Myoview 03/23/12: EF 50%, no ischemia or scar.  [name] HTN (hypertension)    2-D echocardiogram 03/22/12: EF 50%, mild LAE, no wall motion abnormalities.  . Tubal pregnancy    In her late 20's  . Type II or unspecified type diabetes mellitus without mention of complication, not stated as uncontrolled   . Vitamin D deficiency     Surgical History:  Past Surgical History:  Procedure Laterality Date  . CESAREAN SECTION    . TUBAL LIGATION      Family History:  Family History  Problem Relation Age of Onset  . Diabetes type II Sister   . Hypertension Mother   . Hypertension Father   . Hypertension Sister     Social History:  Social History  Substance Use Topics  . Smoking status: Former Smoker    Packs/day: 0.20    Years: 25.00    Types: Cigarettes    Quit date: 03/21/2012  . Smokeless tobacco: Never  Used  . Alcohol use No     Comment: stopped 2013    Review of Systems: Review of Systems  Constitutional: Positive for chills, diaphoresis and malaise/fatigue. Negative for fever.  HENT: Negative for congestion, ear pain, hearing loss and sore throat.   Eyes: Positive for blurred vision. Negative for double vision, photophobia, pain, discharge and redness.  Respiratory: Positive for shortness of breath. Negative for cough, sputum production and wheezing.   Cardiovascular: Positive for palpitations and PND. Negative for chest pain, orthopnea, claudication and leg swelling.  Gastrointestinal: Positive for constipation. Negative for abdominal pain, blood in stool, diarrhea, heartburn, melena, nausea and vomiting.  Genitourinary: Negative for dysuria, frequency, hematuria and urgency.  Skin: Negative.   Neurological: Positive for dizziness and loss  of consciousness. Negative for sensory change and headaches.  Psychiatric/Behavioral: Negative for depression. The patient is not nervous/anxious and does not have insomnia.     Physical Exam: Estimated body mass index is 35.64 kg/m as calculated from the following:   Height as of this encounter: 5\' 1"  (1.549 m).   Weight as of this encounter: 188 lb 9.6 oz (85.5 kg). BP (!) 150/100   Pulse 72   Temp 97.5 F (36.4 C)   Resp 16   Ht 5\' 1"  (1.549 m)   Wt 188 lb 9.6 oz (85.5 kg)   BMI 35.64 kg/m   General Appearance: Well nourished well developed, in no apparent distress.  Eyes: PERRLA, EOMs, conjunctiva no swelling or erythema ENT/Mouth: Ear canals normal without obstruction, swelling, erythema, or discharge.  TMs normal bilaterally with no erythema, bulging, retraction, or loss of landmark.  Oropharynx moist and clear with no exudate, erythema, or swelling.   Neck: Supple, thyroid normal. No bruits.  No cervical adenopathy Respiratory: Respiratory effort normal, Breath sounds clear A&P without wheeze, rhonchi, rales.   Cardio: RRR without  murmurs, rubs or gallops. Brisk peripheral pulses without edema.  Chest: symmetric, with normal excursions Breasts: Symmetric, without lumps, nipple discharge, retractions.  Abdomen: Soft, nontender, no guarding, rebound, hernias, masses, or organomegaly.  Lymphatics: Non tender without lymphadenopathy.  Genitourinary: Normal female external genitalia.  Vaginal mucosa moist and pink without discharge.  Parous cervix with normal contour.  No CMT.  No adnexal tenderness or masses.   Musculoskeletal: Full ROM all peripheral extremities,5/5 strength, and normal gait.  Skin: Warm, dry without rashes, lesions, ecchymosis. Neuro: Awake and oriented X 3, Cranial nerves intact, reflexes equal bilaterally. Normal muscle tone, no cerebellar symptoms. Sensation intact.  Psych:  normal affect, Insight and Judgment appropriate.   EKG: WNL no changes.  Over 40 minutes of exam, counseling, chart review and critical decision making was performed  Loma Sousa Forcucci 3:24 PM [facility]

## 2016-10-06 NOTE — Patient Instructions (Signed)
Please start taking amlodipine once daily with food.  Try to take it at the same time every day.  Try to monitor your blood pressure once per day and keep a log so that you know what your blood pressure is doing at home.  Try to get an eye doctor appointment to see if you have any changes caused by your high blood pressure.    Please call either [facility] or the [facility] to get your mammogram set up.   Please try to start getting some type of exercise in whether it is walking or something else.   Please take vitamin D 5,000 IU daily.  Please try to drink plenty of water and avoid high salt intake.   You will get a call from [place] who is our referral specialist about getting in to see the cardiologist.     I will see you in approximately a month to recheck your blood pressure.

## 2016-10-07 LAB — VITAMIN D 25 HYDROXY (VIT D DEFICIENCY, FRACTURES): Vit D, 25-Hydroxy: 27 ng/mL — ABNORMAL LOW (ref 30–100)

## 2016-10-07 LAB — VITAMIN B12: Vitamin B-12: 599 pg/mL (ref 200–1100)

## 2016-10-07 LAB — LIPID PANEL
Cholesterol: 148 mg/dL (ref <200)
HDL: 67 mg/dL (ref >50)
LDL Cholesterol: 64 mg/dL (ref <100)
Total CHOL/HDL Ratio: 2.2 Ratio (ref <5.0)
Triglycerides: 83 mg/dL (ref <150)
VLDL: 17 mg/dL (ref <30)

## 2016-10-07 LAB — BASIC METABOLIC PANEL WITH GFR
BUN: 12 mg/dL (ref 7–25)
CO2: 26 mmol/L (ref 20–31)
Calcium: 9.4 mg/dL (ref 8.6–10.2)
Chloride: 104 mmol/L (ref 98–110)
Creat: 0.78 mg/dL (ref 0.50–1.10)
GFR, Est African American: >89 mL/min (ref >=60)
GFR, Est Non African American: >89 mL/min (ref >=60)
Glucose, Bld: 86 mg/dL (ref 65–99)
Potassium: 4 mmol/L (ref 3.5–5.3)
Sodium: 140 mmol/L (ref 135–146)

## 2016-10-07 LAB — IRON AND TIBC
%SAT: 15 % (ref 11–50)
Iron: 54 ug/dL (ref 40–190)
TIBC: 357 ug/dL (ref 250–450)
UIBC: 303 ug/dL (ref 125–400)

## 2016-10-07 LAB — URINALYSIS, ROUTINE W REFLEX MICROSCOPIC
Bilirubin Urine: NEGATIVE
Glucose, UA: NEGATIVE
Ketones, ur: NEGATIVE
Nitrite: NEGATIVE
Protein, ur: NEGATIVE
Specific Gravity, Urine: 1.016 (ref 1.001–1.035)
pH: 7 (ref 5.0–8.0)

## 2016-10-07 LAB — URINALYSIS, MICROSCOPIC ONLY
Bacteria, UA: NONE SEEN [HPF]
Casts: NONE SEEN [LPF]
Crystals: NONE SEEN [HPF]
Yeast: NONE SEEN [HPF]

## 2016-10-07 LAB — HEPATIC FUNCTION PANEL
ALT: 7 U/L (ref 6–29)
AST: 13 U/L (ref 10–35)
Albumin: 4.1 g/dL (ref 3.6–5.1)
Alkaline Phosphatase: 80 U/L (ref 33–115)
Bilirubin, Direct: 0.1 mg/dL (ref <=0.2)
Indirect Bilirubin: 0.3 mg/dL (ref 0.2–1.2)
Total Bilirubin: 0.4 mg/dL (ref 0.2–1.2)
Total Protein: 7.1 g/dL (ref 6.1–8.1)

## 2016-10-07 LAB — FSH/LH
FSH: 30.5 m[IU]/mL
LH: 28.1 m[IU]/mL

## 2016-10-07 LAB — INSULIN, RANDOM: Insulin: 10.4 u[IU]/mL (ref 2.0–19.6)

## 2016-10-07 LAB — MICROALBUMIN / CREATININE URINE RATIO
Creatinine, Urine: 109 mg/dL (ref 20–320)
Microalb Creat Ratio: 28 mcg/mg creat (ref <30)
Microalb, Ur: 3.1 mg/dL

## 2016-10-07 LAB — MAGNESIUM: Magnesium: 1.8 mg/dL (ref 1.5–2.5)

## 2016-10-11 ENCOUNTER — Telehealth: Payer: Self-pay | Admitting: *Deleted

## 2016-10-11 LAB — CYTOLOGY - PAP
Diagnosis: NEGATIVE
HPV: NOT DETECTED

## 2016-10-11 LAB — ESTROGENS, TOTAL: Estrogen: 431.3 pg/mL

## 2016-10-11 NOTE — Telephone Encounter (Signed)
Pt aware of lab results 

## 2016-11-03 ENCOUNTER — Encounter: Payer: Self-pay | Admitting: Internal Medicine

## 2016-11-03 ENCOUNTER — Ambulatory Visit (INDEPENDENT_AMBULATORY_CARE_PROVIDER_SITE_OTHER): Payer: No Typology Code available for payment source | Admitting: Internal Medicine

## 2016-11-03 VITALS — BP 128/80 | HR 62 | Temp 98.2°F | Resp 18 | Ht 61.0 in | Wt 193.0 lb

## 2016-11-03 DIAGNOSIS — I10 Essential (primary) hypertension: Secondary | ICD-10-CM | POA: Diagnosis not present

## 2016-11-03 NOTE — Progress Notes (Signed)
,,,,,,,,,,,,,,,,,,,,,,,,,,,,,,,, 

## 2016-11-03 NOTE — Progress Notes (Signed)
Assessment and Plan:   1. Essential hypertension -cont amlodipine -really good control -will defer weight loss medications until after she sees Dr. Martinique    HPI 46 y.o.female presents for 1 month follow up of hypertension.  She had a history of labile hypertension in the past.  She was restarted on amlodipine 10 mg which she had done well with in the past.  Patient reports that they have been doing well. She is checking blood pressure at home.  She is getting readings less than 140/80 at home.  She is exercising and is doing well with this.  She has had no more near syncopal episodes.  She is due to see Dr. Martinique soon.  She would like help with her weight loss.   Past Medical History:  Diagnosis Date  . Anemia   . Bradycardia    Event monitor 8/13: NSR, sinus bradycardia, sinus tachycardia, no significant bradycardia arrhythmias  . Chest pain 03/22/2012   Normal Myoview, normal echo, chest CT negative for pulmonary embolism  . DM (diabetes mellitus) ([place])   . History of nuclear stress test 03/2012   Myoview 03/23/12: EF 50%, no ischemia or scar.  [name] HTN (hypertension)    2-D echocardiogram 03/22/12: EF 50%, mild LAE, no wall motion abnormalities.  . Tubal pregnancy    In her late 20's  . Type II or unspecified type diabetes mellitus without mention of complication, not stated as uncontrolled   . Vitamin D deficiency      Allergies  Allergen Reactions  . Lisinopril     Angioedema       Current Outpatient Prescriptions on File Prior to Visit  Medication Sig Dispense Refill  . amLODipine (NORVASC) 10 MG tablet take 1 tablet by mouth once daily 30 tablet 2   No current facility-administered medications on file prior to visit.     ROS: all negative except above.   Physical Exam: Filed Weights   11/03/16 1539  Weight: 193 lb (87.5 kg)    BP 128/80   Pulse 62   Temp 98.2 F (36.8 C) (Temporal)   Resp 18   Ht 5\' 1"  (1.549 m)   Wt 193 lb (87.5 kg)   LMP 10/27/2016    BMI 36.47 kg/m  General Appearance: Well developed well nourished, non-toxic appearing in no apparent distress. Eyes: PERRLA, EOMs, conjunctiva w/ no swelling or erythema or discharge Sinuses: No Frontal/maxillary tenderness ENT/Mouth: Ear canals clear without swelling or erythema.  TM's normal bilaterally with no retractions, bulging, or loss of landmarks.   Neck: Supple, thyroid normal, no notable JVD  Respiratory: Respiratory effort normal, Clear breath sounds anteriorly and posteriorly bilaterally without rales, rhonchi, wheezing or stridor. No retractions or accessory muscle usage. Cardio: RRR with no MRGs.   Abdomen: Soft, + BS.  Non tender, no guarding, rebound, hernias, masses.  Musculoskeletal: Full ROM, 5/5 strength, normal gait.  Skin: Warm, dry without rashes  Neuro: Awake and oriented X 3, Cranial nerves intact. Normal muscle tone, no cerebellar symptoms. Sensation intact.  Psych: normal affect, Insight and Judgment appropriate.     Starlyn Skeans, PA-C 3:46 PM [facility]

## 2016-11-14 NOTE — Progress Notes (Signed)
Cardiology Office Note    Date:  11/15/2016   ID:  Christy Waller, DOB 1970/11/08, MRN [id number]  PCP:  Alesia Richards, MD  Cardiologist:  Salisha Bardsley [place], MD    History of Present Illness:  Christy Waller is a 46 y.o. female seen at the request of Christy Skeans PA-C  for evaluation of HTN and dizziness. She has a history of HTN and DM. She was evaluated in 2013 when she presented with chest pain. Cardiac markers were negative. 2-D echocardiogram 03/22/12: EF 50%, mild LAE, no wall motion abnormalities. Myoview 03/23/12: EF 50%, no ischemia or scar. Chest CT angiogram 03/22/12: Negative for pulmonary embolism. Patient was noted to have sinus bradycardia with heart rates down into the 40s. A 21 day event monitor demonstrated sinus rhythm, sinus bradycardia, sinus tachycardia and occasional PVCs. No significant bradyarrhythmias noted. Of note, the patient was able to get her heart rate up to 152 on the treadmill for her Myoview ruling out chronotropic incompetence.  More recently she was evaluated in the ED on February 12 with complaints of dizziness. Her BP was elevated at 195/102. She states she had stopped taking antihypertensive therapy 4 yrs ago. She was started on amlodipine. Since then she reports good BP control with BP readings at home of 638-937 systolic and 34-28 diastolic. Her symptoms of dizziness have resolved. She denies any chest pain, SOB, palpitations. She did quit smoking on February 14. Since then she has gained 23 lbs. She is sedentary.     Past Medical History:  Diagnosis Date  . Anemia   . Bradycardia    Event monitor 8/13: NSR, sinus bradycardia, sinus tachycardia, no significant bradycardia arrhythmias  . Chest pain 03/22/2012   Normal Myoview, normal echo, chest CT negative for pulmonary embolism  . History of nuclear stress test 03/2012   Myoview 03/23/12: EF 50%, no ischemia or scar.  [name] HTN (hypertension)    2-D echocardiogram 03/22/12: EF 50%, mild LAE, no  wall motion abnormalities.  . Tubal pregnancy    In her late 20's  . Vitamin D deficiency     Past Surgical History:  Procedure Laterality Date  . CESAREAN SECTION    . TUBAL LIGATION      Current Medications: Outpatient Medications Prior to Visit  Medication Sig Dispense Refill  . amLODipine (NORVASC) 10 MG tablet take 1 tablet by mouth once daily 30 tablet 2   No facility-administered medications prior to visit.      Allergies:   Lisinopril   Social History   Social History  . Marital status: Divorced    Spouse name: N/A  . Number of children: 2  . Years of education: N/A   Occupational History  . packer/driver     [employer]   Social History Main Topics  . Smoking status: Former Smoker    Packs/day: 0.20    Years: 25.00    Types: Cigarettes    Quit date: 09/29/2016  . Smokeless tobacco: Never Used  . Alcohol use No  . Drug use: Yes    Types: Marijuana  . Sexual activity: Yes   Other Topics Concern  . None   Social History Narrative  . None     Family History:  The patient's family history includes Diabetes type II in her sister; Hypertension in her father, mother, and sister.   ROS:   Please see the history of present illness.    ROS All other systems reviewed and are negative.  PHYSICAL EXAM:   VS:  BP (!) 154/92   Pulse 64   Ht 5\' 2"  (1.575 m)   Wt 193 lb 9.6 oz (87.8 kg)   LMP 10/27/2016   BMI 35.41 kg/m    GEN: Well nourished, obese, in no acute distress  HEENT: normal  Neck: no JVD, carotid bruits, or masses Cardiac: RRR; no murmurs, rubs, or gallops,no edema  Respiratory:  clear to auscultation bilaterally, normal work of breathing GI: soft, nontender, nondistended, + BS MS: no deformity or atrophy  Skin: warm and dry, no rash Neuro:  Alert and Oriented x 3, Strength and sensation are intact Psych: euthymic mood, full affect  Wt Readings from Last 3 Encounters:  11/15/16 193 lb 9.6 oz (87.8 kg)  11/03/16 193 lb  (87.5 kg)  10/06/16 188 lb 9.6 oz (85.5 kg)      Studies/Labs Reviewed:   EKG:  EKG is not ordered today.  The ekg ordered 10/07/16 demonstrates Sinus arrhythmia with normal Ecg. I have personally reviewed and interpreted this study.   Recent Labs: 10/06/2016: ALT 7; BUN 12; Creat 0.78; Hemoglobin 13.1; Magnesium 1.8; Platelets 240; Potassium 4.0; Sodium 140; TSH 1.44   Lipid Panel    Component Value Date/Time   CHOL 148 10/06/2016 1622   TRIG 83 10/06/2016 1622   HDL 67 10/06/2016 1622   CHOLHDL 2.2 10/06/2016 1622   VLDL 17 10/06/2016 1622   LDLCALC 64 10/06/2016 1622    Additional studies/ records that were reviewed today include:    ASSESSMENT:    1. Essential hypertension   2. Dizziness      PLAN:  In order of problems listed above:  1. BP is elevated today but based on BP readings at home it appears that BP control is adequate. Her symptoms have resolved with BP control. Based on this and prior cardiac evaluation I do not feel further evaluation is needed. I stressed the importance of lifestyle modification. I congratulated her on smoking cessation. Now she needs to focus more on regular aerobic exercise and weight loss. I will see her back as needed.     Medication Adjustments/Labs and Tests Ordered: Current medicines are reviewed at length with the patient today.  Concerns regarding medicines are outlined above.  Medication changes, Labs and Tests ordered today are listed in the Patient Instructions below. Patient Instructions  Continue your current therapy  Work on daily exercise- walking  Lose weight  Eat healthy- lots of fruits and vegetables. No salt. Avoid grease.  I will see you as needed.    Signed, Eyad Rochford Martinique, MD  11/15/2016 5:41 PM    [facility] [address], [place], [place], [address] [phone]

## 2016-11-15 ENCOUNTER — Ambulatory Visit (INDEPENDENT_AMBULATORY_CARE_PROVIDER_SITE_OTHER): Payer: PRIVATE HEALTH INSURANCE | Admitting: Cardiology

## 2016-11-15 ENCOUNTER — Encounter: Payer: Self-pay | Admitting: Cardiology

## 2016-11-15 VITALS — BP 154/92 | HR 64 | Ht 62.0 in | Wt 193.6 lb

## 2016-11-15 DIAGNOSIS — R42 Dizziness and giddiness: Secondary | ICD-10-CM | POA: Diagnosis not present

## 2016-11-15 DIAGNOSIS — I10 Essential (primary) hypertension: Secondary | ICD-10-CM

## 2016-11-15 NOTE — Patient Instructions (Signed)
Continue your current therapy  Work on daily exercise- walking  Lose weight  Eat healthy- lots of fruits and vegetables. No salt. Avoid grease.  I will see you as needed.

## 2016-11-25 ENCOUNTER — Ambulatory Visit: Payer: Self-pay | Admitting: Physician Assistant

## 2016-11-25 NOTE — Progress Notes (Deleted)
46 y.o.female presents for a follow up to discuss weight loss medication. She recently saw her cardiologist Dr. Martinique for dizziness/BP control, at this time BP has been adequately controlled.   BMI is There is no height or weight on file to calculate BMI., she is working on diet and exercise. Wt Readings from Last 3 Encounters:  11/15/16 193 lb 9.6 oz (87.8 kg)  11/03/16 193 lb (87.5 kg)  10/06/16 188 lb 9.6 oz (85.5 kg)    Typical breakfast: Typical lunch:  Typical dinner:  Medications: Current Outpatient Prescriptions on File Prior to Visit  Medication Sig Dispense Refill  . amLODipine (NORVASC) 10 MG tablet take 1 tablet by mouth once daily 30 tablet 2   No current facility-administered medications on file prior to visit.     ROS: All negative except for above  Physical exam: There were no vitals filed for this visit. Physical Exam  Assessment: Obesity with co morbid conditions.   Plan: General weight loss/lifestyle modification strategies discussed (elicit support from others; identify saboteurs; non-food rewards, etc). Continue food diary Continue restricted calorie diet Continue daily exercise as well as behavior modification such as walking further away, putting down the fork, having a plan, using stairs, etc.  Medication: phentermine  Follow up in {NUMBERS;1-7 BY 1:408017} months Future Appointments Date Time Provider [place]  11/25/2016 3:30 PM Vicie Mutters, PA-C [facility] None  04/26/2017 3:30 PM Courtney Forcucci, PA-C [facility] None    [id number] 15 mins [id number] 30 mins [id number] 45 mins [id number] 60 mins

## 2016-12-21 ENCOUNTER — Other Ambulatory Visit: Payer: Self-pay | Admitting: *Deleted

## 2016-12-21 MED ORDER — AMLODIPINE BESYLATE 10 MG PO TABS: ORAL_TABLET | ORAL | 3 refills | Status: DC

## 2017-01-03 ENCOUNTER — Other Ambulatory Visit: Payer: Self-pay

## 2017-01-03 MED ORDER — AMLODIPINE BESYLATE 10 MG PO TABS: ORAL_TABLET | ORAL | 3 refills | Status: DC

## 2017-02-04 ENCOUNTER — Other Ambulatory Visit: Payer: Self-pay | Admitting: *Deleted

## 2017-02-04 MED ORDER — AMLODIPINE BESYLATE 10 MG PO TABS: ORAL_TABLET | ORAL | 3 refills | Status: DC

## 2017-04-25 DIAGNOSIS — E669 Obesity, unspecified: Secondary | ICD-10-CM | POA: Insufficient documentation

## 2017-04-25 NOTE — Progress Notes (Signed)
FOLLOW UP  Assessment and Plan:    Hypertension  Well controlled on current medications:  Amlodipine 10 mg tab daily  Conitor blood pressure checks at home.  Continue DASH diet, exercise.   Reminder to go to the ER if any CP, SOB, nausea, dizziness, severe HA, changes vision/speech, left arm numbness and tingling and jaw pain.  Tobacco Abuse  Smoking cessation-  instruction/counseling given, commended patient for quitting and reviewed strategies for preventing relapses.   Vitamin D Def  Continue medications: 5000 IU vit D  Check Vit D level  Normocytic Anemia  Check CBC  monitor, continue iron supp with Vitamin C and increase green leafy veggies  BMI 36.0-36.9 Adult  Long discussion about weight loss, diet, and exercise  Will start the patient on phentermine as she has been very successful with this in the past- hand out given and AE's discussed, will do close follow up.   Instructed to stop medication and notify office should she experience palpitations/CP/SOB.   Will follow up in 1 month to evaluate progress.       Continue diet and meds as discussed. Further disposition pending results of labs. Discussed med's effects and SE's.   Over 30 minutes of exam, counseling, chart review, and critical decision making was performed.   Future Appointments Date Time Provider [place]  05/30/2017 3:45 PM Vicie Mutters, PA-C [facility] None    ----------------------------------------------------------------------------------------------------------------------  HPI 46 y.o. female  presents for 3 month follow up on hypertension, anemia, vitamin D deficiency, obesity and tobacco abuse.  She reports she quit smoking Feb 2018 after experiencing palpitations, and gained some weight. She discussed weight loss medications with a previous provider, and was on phentermine prior to that for 3 months, with which she was very reportedly very successful and lost over 75  lb. The patient has a wedding next May and is very motivated to lose weight, and has been implementing lifestyle changes.  Exercise: She does workout. Mostly weights/lifting at home. She denies chest pain, shortness of breath, dizziness. She is trying to find time for walking/cardio. Diet: Does not eat breakfast- will eat a granola bar. Cooks dinner at home- lean protein with salad or vegetable with beans. Does not track food.  Water: 4-5 16oz bottle of water daily.     The patient saw Dr. Martinique with cardiology to follow up after a near syncopal event, he noted hypertension well controlled on amlodipine 10 mg daily, and resolution of dizziness with improved BP.  Her blood pressure has been controlled at home, today their BP is BP: 130/82. Funs 120s-130s/80s. She reports she initially experienced some lower extremity edema after initiating Norvasc, but that has since resolved except on days when she is on her feet all day. Counseled on use of compression stockings.    Patient was not at goal for vitamin D at last visit; 5000 u daily initiated:  Lab Results  Component Value Date   VD25OH 27 (L) 10/06/2016        Current Medications:  Current Outpatient Prescriptions on File Prior to Visit  Medication Sig  . amLODipine (NORVASC) 10 MG tablet take 1 tablet by mouth once daily   No current facility-administered medications on file prior to visit.      Allergies:  Allergies  Allergen Reactions  . Lisinopril     Angioedema      Medical History:  has Tobacco abuse; Bradycardia; HTN (hypertension); Normocytic anemia; Snoring; Vitamin D deficiency; and BMI 36.0-36.9,adult on her  problem list.  Family history- Reviewed and unchanged Social history- Reviewed and unchanged   Review of Systems:  Review of Systems  Constitutional: Negative for chills, diaphoresis, fever and malaise/fatigue.  HENT: Negative.   Eyes: Negative.   Respiratory: Negative for cough, shortness of breath and  wheezing.   Cardiovascular: Negative for chest pain, palpitations, claudication and leg swelling.  Gastrointestinal: Negative for abdominal pain, blood in stool, constipation, diarrhea, heartburn, melena, nausea and vomiting.  Genitourinary: Negative.   Musculoskeletal: Negative.   Skin: Negative.   Neurological: Negative for dizziness, tingling, sensory change and headaches.  Psychiatric/Behavioral: Negative.       Physical Exam: BP 130/82   Pulse 72   Temp 97.9 F (36.6 C)   Ht 5\' 2"  (1.575 m)   Wt 204 lb (92.5 kg)   SpO2 97%   BMI 37.31 kg/m  Wt Readings from Last 3 Encounters:  04/26/17 204 lb (92.5 kg)  11/15/16 193 lb 9.6 oz (87.8 kg)  11/03/16 193 lb (87.5 kg)   General Appearance: Well nourished, overweight female in no apparent distress. Eyes: PERRLA, EOMs, conjunctiva no swelling or erythema Sinuses: No Frontal/maxillary tenderness ENT/Mouth: Ext aud canals clear, TMs without erythema, bulging. No erythema, swelling, or exudate on post pharynx.  Tonsils not swollen or erythematous. Hearing normal.  Neck: Supple, thyroid normal without palpable nodules.  Respiratory: Respiratory effort normal, BS equal bilaterally without rales, rhonchi, wheezing or stridor.  Cardio: RRR with no MRGs. Brisk peripheral pulses without edema.  Abdomen: Soft, + BS.  Non tender, no guarding, rebound, hernias, masses. Lymphatics: Non tender without lymphadenopathy.  Musculoskeletal: Full ROM, 5/5 strength, Normal gait Skin: Warm, dry without rashes, lesions, ecchymosis.  Neuro: Cranial nerves intact. No cerebellar symptoms.  Psych: Awake and oriented X 3, normal affect, Insight and Judgment appropriate.    Izora Ribas, NP 4:17 PM [facility]

## 2017-04-26 ENCOUNTER — Ambulatory Visit: Payer: Self-pay | Admitting: Internal Medicine

## 2017-04-26 ENCOUNTER — Ambulatory Visit (INDEPENDENT_AMBULATORY_CARE_PROVIDER_SITE_OTHER): Payer: No Typology Code available for payment source | Admitting: Adult Health

## 2017-04-26 VITALS — BP 130/82 | HR 72 | Temp 97.9°F | Ht 62.0 in | Wt 204.0 lb

## 2017-04-26 DIAGNOSIS — I10 Essential (primary) hypertension: Secondary | ICD-10-CM | POA: Diagnosis not present

## 2017-04-26 DIAGNOSIS — D649 Anemia, unspecified: Secondary | ICD-10-CM | POA: Diagnosis not present

## 2017-04-26 DIAGNOSIS — E559 Vitamin D deficiency, unspecified: Secondary | ICD-10-CM | POA: Diagnosis not present

## 2017-04-26 DIAGNOSIS — Z6836 Body mass index (BMI) 36.0-36.9, adult: Secondary | ICD-10-CM | POA: Diagnosis not present

## 2017-04-26 DIAGNOSIS — Z720 Tobacco use: Secondary | ICD-10-CM

## 2017-04-26 MED ORDER — PHENTERMINE HCL 37.5 MG PO TABS: 37.5000 mg | ORAL_TABLET | Freq: Every day | ORAL | 0 refills | Status: DC

## 2017-04-26 NOTE — Patient Instructions (Signed)
We want weight loss that will last so you should lose 1-2 pounds a week.  THAT IS IT! Please pick THREE things a month to change. Once it is a habit check off the item. Then pick another three items off the list to become habits.  If you are already doing a habit on the list GREAT!  Cross that item off! o Don't drink your calories. Ie, alcohol, soda, fruit juice, and sweet tea.  o Drink more water. Drink a glass when you feel hungry or before each meal.  o Eat breakfast - Complex carb and protein (likeDannon light and fit yogurt, oatmeal, fruit, eggs, [place] bacon). o Measure your cereal.  Eat no more than one cup a day. (ie [place]) o Eat an apple a day. o Add a vegetable a day. o Try a new vegetable a month. o Use Pam! Stop using oil or butter to cook. o Don't finish your plate or use smaller plates. o Share your dessert. o Eat sugar free Jello for dessert or frozen grapes. o Don't eat 2-3 hours before bed. o Switch to whole wheat bread, pasta, and brown rice. o Make healthier choices when you eat out. No fries! o Pick baked chicken, NOT fried. o Don't forget to SLOW DOWN when you eat. It is not going anywhere.  o Take the stairs. o Park far away in the parking lot o [employer] (or weights) for 10 minutes while watching TV. o Walk at work for 10 minutes during break. o Walk outside 1 time a week with your friend, kids, dog, or significant other. o Start a walking group at [place] the mall as much as you can tolerate.  o Keep a food diary. o Weigh yourself daily. o Walk for 15 minutes 3 days per week. o Cook at home more often and eat out less.  If life happens and you go back to old habits, it is okay.  Just start over. You can do it!   If you experience chest pain, get short of breath, or tired during the exercise, please stop immediately and inform your doctor.    Phentermine  While taking the medication we may ask that you come into the office once a month or once  every 2-3 months to monitor your weight, blood pressure, and heart rate. In addition we can help answer your questions about diet, exercise, and help you every step of the way with your weight loss journey. Sometime it is helpful if you bring in a food diary or use an app on your phone such as myfitnesspal to record your calorie intake, especially in the beginning.   You can start out on 1/3 to 1/2 a pill in the morning and if you are tolerating it well you can increase to one pill daily. I also have some patients that take 1/3 or 1/2 at lunch to help prevent night time eating.  This medication is cheapest CASH pay at [facility] is 14-17 dollars and you do NOT need a membership to get meds from there.    What is this medicine? PHENTERMINE (FEN ter meen) decreases your appetite. This medicine is intended to be used in addition to a healthy reduced calorie diet and exercise. The best results are achieved this way. This medicine is only indicated for short-term use. Eventually your weight loss may level out and the medication will no longer be needed.   How should I use this  medicine? Take this medicine by mouth. Follow the directions on the prescription label. The tablets should stay in the bottle until immediately before you take your dose. Take your doses at regular intervals. Do not take your medicine more often than directed.  Overdosage: If you think you have taken too much of this medicine contact a poison control center or emergency room at once. NOTE: This medicine is only for you. Do not share this medicine with others.  What if I miss a dose? If you miss a dose, take it as soon as you can. If it is almost time for your next dose, take only that dose. Do not take double or extra doses. Do not increase or in any way change your dose without consulting your doctor.  What should I watch for while using this medicine? Notify your physician immediately if you become short  of breath while doing your normal activities. Do not take this medicine within 6 hours of bedtime. It can keep you from getting to sleep. Avoid drinks that contain caffeine and try to stick to a regular bedtime every night. Do not stand or sit up quickly, especially if you are an older patient. This reduces the risk of dizzy or fainting spells. Avoid alcoholic drinks.  What side effects may I notice from receiving this medicine? Side effects that you should report to your doctor or health care professional as soon as possible: -chest pain, palpitations -depression or severe changes in mood -increased blood pressure -irritability -nervousness or restlessness -severe dizziness -shortness of breath -problems urinating -unusual swelling of the legs -vomiting  Side effects that usually do not require medical attention (report to your doctor or health care professional if they continue or are bothersome): -blurred vision or other eye problems -changes in sexual ability or desire -constipation or diarrhea -difficulty sleeping -dry mouth or unpleasant taste -headache -nausea This list may not describe all possible side effects. Call your doctor for medical advice about side effects. You may report side effects to FDA at 1-800-FDA-1088.

## 2017-04-27 ENCOUNTER — Encounter: Payer: Self-pay | Admitting: Adult Health

## 2017-04-27 LAB — BASIC METABOLIC PANEL WITH GFR
BUN: 19 mg/dL (ref 7–25)
CO2: 25 mmol/L (ref 20–32)
Calcium: 9.1 mg/dL (ref 8.6–10.2)
Chloride: 106 mmol/L (ref 98–110)
Creat: 0.88 mg/dL (ref 0.50–1.10)
GFR, Est African American: 91 mL/min/{1.73_m2} (ref >=60)
GFR, Est Non African American: 78 mL/min/{1.73_m2} (ref >=60)
Glucose, Bld: 84 mg/dL (ref 65–99)
Potassium: 3.9 mmol/L (ref 3.5–5.3)
Sodium: 138 mmol/L (ref 135–146)

## 2017-04-27 LAB — CBC WITH DIFFERENTIAL/PLATELET
Basophils Absolute: 33 cells/uL (ref 0–200)
Basophils Relative: 0.4 %
Eosinophils Absolute: 246 cells/uL (ref 15–500)
Eosinophils Relative: 3 %
HCT: 39.6 % (ref 35.0–45.0)
Hemoglobin: 12.7 g/dL (ref 11.7–15.5)
Lymphs Abs: 1804 cells/uL (ref 850–3900)
MCH: 27.1 pg (ref 27.0–33.0)
MCHC: 32.1 g/dL (ref 32.0–36.0)
MCV: 84.4 fL (ref 80.0–100.0)
MPV: 10.4 fL (ref 7.5–12.5)
Monocytes Relative: 7.9 %
Neutro Abs: 5469 cells/uL (ref 1500–7800)
Neutrophils Relative %: 66.7 %
Platelets: 273 10*3/uL (ref 140–400)
RBC: 4.69 10*6/uL (ref 3.80–5.10)
RDW: 14.5 % (ref 11.0–15.0)
Total Lymphocyte: 22 %
WBC mixed population: 648 cells/uL (ref 200–950)
WBC: 8.2 10*3/uL (ref 3.8–10.8)

## 2017-04-27 LAB — VITAMIN D 25 HYDROXY (VIT D DEFICIENCY, FRACTURES): Vit D, 25-Hydroxy: 33 ng/mL (ref 30–100)

## 2017-04-27 NOTE — Progress Notes (Signed)
Pt aware of lab results & voiced understanding of those results.

## 2017-05-29 NOTE — Progress Notes (Signed)
Assessment and Plan:  Diagnoses and all orders for this visit:  Obesity (BMI 30-39.9) -     phentermine (ADIPEX-P) 37.5 MG tablet; Take 1 tablet (37.5 mg total) by mouth daily before breakfast. Take 1 full tab daily  Essential hypertension      -       Continue amlodipine 10 mg daily      -       Check BP daily; call if running over 130/80 consistently       -       DASH diet      -        Go to ER with severe HA/dizziness/weakness, CP/SOB/N/V/arm, neck or jaw pain Tobacco abuse      -      Commended on continued cessation of tobacco; currently 8 months post quitting.   BMI 36.0-36.9,adult -     phentermine (ADIPEX-P) 37.5 MG tablet; Take 1 tablet (37.5 mg total) by mouth daily before breakfast. Take 1 full tab daily       -     Continue with excellent work on diet and exercise  Further disposition pending results of labs. Discussed med's effects and SE's.   Over 15 minutes of exam, counseling, chart review, and critical decision making was performed.   Future Appointments Date Time Provider [place]  08/01/2017 4:00 PM Liane Comber, NP [facility] None    ------------------------------------------------------------------------------------------------------------------   HPI 46 y.o.female presents for follow up on obesity (BMI 37) for which phentermine was initiated last month. Also following up on hypertension and tobacco use. BP today is 136/78- patient has been checking at home which have reportedly been 120s/80s. Will follow up at next visit in 2 months.   she is prescribed phentermine for weight loss.  While on the medication they have lost 9 lbs since last visit. They deny palpitations, anxiety, trouble sleeping, elevated BP.   BMI is Body mass index is 35.67 kg/m., she is working on diet and exercise. Wt Readings from Last 3 Encounters:  05/30/17 195 lb (88.5 kg)  04/26/17 204 lb (92.5 kg)  11/15/16 193 lb 9.6 oz (87.8 kg)   Typical breakfast: Boiled egg  and banana Typical lunch: Salad with chicken/lean protein Typical dinner: Full course if before 7pm- encouraged a small meal or snack if after 7 pm. Exercise: 10 min AM and PM brisk walking Water intake: 6~8 16.9 oz of water   Past Medical History:  Diagnosis Date  . Anemia   . Bradycardia    Event monitor 8/13: NSR, sinus bradycardia, sinus tachycardia, no significant bradycardia arrhythmias  . Chest pain 03/22/2012   Normal Myoview, normal echo, chest CT negative for pulmonary embolism  . History of nuclear stress test 03/2012   Myoview 03/23/12: EF 50%, no ischemia or scar.  [name] HTN (hypertension)    2-D echocardiogram 03/22/12: EF 50%, mild LAE, no wall motion abnormalities.  . Tubal pregnancy    In her late 20's  . Vitamin D deficiency      Allergies  Allergen Reactions  . Lisinopril     Angioedema     Current Outpatient Prescriptions on File Prior to Visit  Medication Sig  . amLODipine (NORVASC) 10 MG tablet take 1 tablet by mouth once daily   No current facility-administered medications on file prior to visit.     ROS: Review of Systems  Constitutional: Negative.   HENT: Negative for congestion, hearing loss and sore throat.   Eyes: Negative for blurred  vision and double vision.  Respiratory: Negative for cough, shortness of breath and wheezing.   Cardiovascular: Negative for chest pain and palpitations.  Gastrointestinal: Negative for abdominal pain, blood in stool, constipation, diarrhea, heartburn, melena, nausea and vomiting.  Genitourinary: Negative.   Musculoskeletal: Negative for myalgias.  Skin: Negative.   Neurological: Negative for dizziness, sensory change and headaches.  Endo/Heme/Allergies: Negative.   Psychiatric/Behavioral: Negative.     Physical Exam:  BP 136/78   Pulse 78   Wt 195 lb (88.5 kg)   SpO2 98%   BMI 35.67 kg/m   General Appearance: Well nourished, in no apparent distress. Neck: Supple, thyroid normal.  Respiratory: Respiratory  effort normal, BS equal bilaterally without rales, rhonchi, wheezing or stridor.  Cardio: RRR with no MRGs. Brisk peripheral pulses without edema.  Abdomen: Soft, + BS.  Non tender, no guarding, rebound, hernias, masses. Lymphatics: Non tender without lymphadenopathy.  Musculoskeletal: Full ROM, normal gait.  Skin: Warm, dry without rashes, lesions, ecchymosis.  Neuro:  Normal muscle tone, no cerebellar symptoms. Sensation intact.  Psych: Awake and oriented X 3, normal affect, Insight and Judgment appropriate.     Izora Ribas, NP 4:05 PM [facility]

## 2017-05-30 ENCOUNTER — Ambulatory Visit (INDEPENDENT_AMBULATORY_CARE_PROVIDER_SITE_OTHER): Payer: No Typology Code available for payment source | Admitting: Adult Health

## 2017-05-30 ENCOUNTER — Encounter: Payer: Self-pay | Admitting: Adult Health

## 2017-05-30 VITALS — BP 136/78 | HR 78 | Wt 195.0 lb

## 2017-05-30 DIAGNOSIS — I10 Essential (primary) hypertension: Secondary | ICD-10-CM

## 2017-05-30 DIAGNOSIS — Z87891 Personal history of nicotine dependence: Secondary | ICD-10-CM

## 2017-05-30 DIAGNOSIS — E669 Obesity, unspecified: Secondary | ICD-10-CM

## 2017-05-30 DIAGNOSIS — Z6836 Body mass index (BMI) 36.0-36.9, adult: Secondary | ICD-10-CM | POA: Diagnosis not present

## 2017-05-30 MED ORDER — PHENTERMINE HCL 37.5 MG PO TABS: 37.5000 mg | ORAL_TABLET | Freq: Every day | ORAL | 1 refills | Status: DC

## 2017-05-30 NOTE — Patient Instructions (Signed)
Ways to cut 100 calories  1. Eat your eggs with hot sauce OR salsa instead of cheese.  Eggs are great for breakfast, but many people consider eggs and cheese to be BFFs. Instead of cheese-1 oz. of cheddar has 114 calories-top your eggs with hot sauce, which contains no calories and helps with satiety and metabolism. Salsa is also a great option!!  2. Top your toast, waffles or pancakes with mashed berries instead of jelly or syrup. Half a cup of berries-fresh, frozen or thawed-has about 40 calories, compared with 2 tbsp. of maple syrup or jelly, which both have about 100 calories. The berries will also give you a good punch of fiber, which helps keep you full and satisfied and won't spike blood sugar quickly like the jelly or syrup. 3. Swap the non-fat latte for black coffee with a splash of half-and-half. Contrary to its name, that non-fat latte has 130 calories and a startling 19g of carbohydrates per 16 oz. serving. Replacing that 'light' drinkable dessert with a black coffee with a splash of half-and-half saves you more than 100 calories per 16 oz. serving. 4. Sprinkle salads with freeze-dried raspberries instead of dried cranberries. If you want a sweet addition to your nutritious salad, stay away from dried cranberries. They have a whopping 130 calories per  cup and 30g carbohydrates. Instead, sprinkle freeze-dried raspberries guilt-free and save more than 100 calories per  cup serving, adding 3g of belly-filling fiber. 5. Go for mustard in place of mayo on your sandwich. Mustard can add really nice flavor to any sandwich, and there are tons of varieties, from spicy to honey. A serving of mayo is 95 calories, versus 10 calories in a serving of mustard. 6. Choose a DIY salad dressing instead of the store-bought kind. Mix Dijon or whole grain mustard with low-fat Kefir or red wine vinegar and garlic. 7. Use hummus as a spread instead of a dip. Use hummus as a spread on a high-fiber cracker or  tortilla with a sandwich and save on calories without sacrificing taste. 8. Pick just one salad "accessory." Salad isn't automatically a calorie winner. It's easy to over-accessorize with toppings. Instead of topping your salad with nuts, avocado and cranberries (all three will clock in at 313 calories), just pick one. The next day, choose a different accessory, which will also keep your salad interesting. You don't wear all your jewelry every day, right? 9. Ditch the white pasta in favor of spaghetti squash. One cup of cooked spaghetti squash has about 40 calories, compared with traditional spaghetti, which comes with more than 200. Spaghetti squash is also nutrient-dense. It's a good source of fiber and Vitamins A and C, and it can be eaten just like you would eat pasta-with a great tomato sauce and [place] meatballs or with pesto, tofu and spinach, for example. 10. Dress up your chili, soups and stews with non-fat [place] yogurt instead of sour cream. Just a 'dollop' of sour cream can set you back 115 calories and a whopping 12g of fat-seven of which are of the artery-clogging variety. Added bonus: [place] yogurt is packed with muscle-building protein, calcium and B Vitamins. 11. Mash cauliflower instead of mashed potatoes. One cup of traditional mashed potatoes-in all their creamy goodness-has more than 200 calories, compared to mashed cauliflower, which you can typically eat for less than 100 calories per 1 cup serving. Cauliflower is a great source of the antioxidant indole-3-carbinol (I3C), which may help reduce the risk of some cancers, like breast  cancer. 12. Ditch the ice cream sundae in favor of a Greek yogurt parfait. Instead of a cup of ice cream or fro-yo for dessert, try 1 cup of nonfat Greek yogurt topped with fresh berries and a sprinkle of cacao nibs. Both toppings are packed with antioxidants, which can help reduce cellular inflammation and oxidative damage. And the comparison is a  no-brainer: One cup of ice cream has about 275 calories; one cup of frozen yogurt has about 230; and a cup of Greek yogurt has just 130, plus twice the protein, so you're less likely to return to the freezer for a second helping. 13. Put olive oil in a spray container instead of using it directly from the bottle. Each tablespoon of olive oil is 120 calories and 15g of fat. Use a mister instead of pouring it straight into the pan or onto a salad. This allows for portion control and will save you more than 100 calories. 14. When baking, substitute canned pumpkin for butter or oil. Canned pumpkin-not pumpkin pie mix-is loaded with Vitamin A, which is important for skin and eye health, as well as immunity. And the comparisons are pretty crazy:  cup of canned pumpkin has about 40 calories, compared to butter or oil, which has more than 800 calories. Yes, 800 calories. Applesauce and mashed banana can also serve as good substitutions for butter or oil, usually in a 1:1 ratio. 15. Top casseroles with high-fiber cereal instead of breadcrumbs. Breadcrumbs are typically made with white bread, while breakfast cereals contain 5-9g of fiber per serving. Not only will you save more than 150 calories per  cup serving, the swap will also keep you more full and you'll get a metabolism boost from the added fiber. 16. Snack on pistachios instead of macadamia nuts. Believe it or not, you get the same amount of calories from 35 pistachios (100 calories) as you would from only five macadamia nuts. 17. Chow down on kale chips rather than potato chips. This is my favorite 'don't knock it 'till you try it' swap. Kale chips are so easy to make at home, and you can spice them up with a little grated parmesan or chili powder. Plus, they're a mere fraction of the calories of potato chips, but with the same crunch factor we crave so often. 18. Add seltzer and some fruit slices to your cocktail instead of soda or fruit juice. One  cup of soda or fruit juice can pack on as much as 140 calories. Instead, use seltzer and fruit slices. The fruit provides valuable phytochemicals, such as flavonoids and anthocyanins, which help to combat cancer and stave off the aging process.  

## 2017-07-31 NOTE — Progress Notes (Deleted)
Assessment and Plan:  Diagnoses and all orders for this visit:  Essential hypertension Well controlled with current plan; continue medication Monitor blood pressure at home; call if consistently over 130/80 Continue DASH diet.   Reminder to go to the ER if any CP, SOB, nausea, dizziness, severe HA, changes vision/speech, left arm numbness and tingling and jaw pain.  History of tobacco abuse Has continued to abstain; commended on 10 months Discussed strategies for continued success  Obesity (BMI 30-39.9) Long discussion about weight loss, diet, and exercise Discussed final goal weight (below ***) and current weight loss goal (***) Patient on phentermine with benefit and no SE, taking drug breaks; continue close follow up. Return in ***   Further disposition pending results of labs. Discussed med's effects and SE's.   Over 15 minutes of exam, counseling, chart review, and critical decision making was performed.   Future Appointments  Date Time Provider [place]  08/01/2017  4:00 PM Liane Comber, NP [facility] None    ------------------------------------------------------------------------------------------------------------------   HPI 46 y.o.female presents for 8 week follow up for obesity treated with phentermine and htn. She quit smoking this year in February; she reports she is doing well and has continued to abstain from tobacco use.   she is prescribed phentermine for weight loss.  While on the medication they have lost {NUMBERS 0-12:18577} lbs since last visit. They deny palpitations, anxiety, trouble sleeping, elevated BP.   BMI is There is no height or weight on file to calculate BMI., she is working on diet and exercise. Wt Readings from Last 3 Encounters:  05/30/17 195 lb (88.5 kg)  04/26/17 204 lb (92.5 kg)  11/15/16 193 lb 9.6 oz (87.8 kg)   Typical breakfast: Typical lunch:  Typical dinner: Exercise:  Water intake:   Her blood pressure {HAS HAS  NOT:18834} been controlled at home, today their BP is    She {DOES_DOES EPP:29518} workout. She denies chest pain, shortness of breath, dizziness.   Past Medical History:  Diagnosis Date  . Anemia   . Bradycardia    Event monitor 8/13: NSR, sinus bradycardia, sinus tachycardia, no significant bradycardia arrhythmias  . Chest pain 03/22/2012   Normal Myoview, normal echo, chest CT negative for pulmonary embolism  . History of nuclear stress test 03/2012   Myoview 03/23/12: EF 50%, no ischemia or scar.  [name] HTN (hypertension)    2-D echocardiogram 03/22/12: EF 50%, mild LAE, no wall motion abnormalities.  . Tubal pregnancy    In her late 20's  . Vitamin D deficiency      Allergies  Allergen Reactions  . Lisinopril     Angioedema     Current Outpatient Medications on File Prior to Visit  Medication Sig  . amLODipine (NORVASC) 10 MG tablet take 1 tablet by mouth once daily  . phentermine (ADIPEX-P) 37.5 MG tablet Take 1 tablet (37.5 mg total) by mouth daily before breakfast. Take 1 full tab daily   No current facility-administered medications on file prior to visit.     ROS: Review of Systems  Constitutional: Negative for malaise/fatigue.  HENT: Negative for hearing loss and tinnitus.   Eyes: Negative for blurred vision and double vision.  Respiratory: Negative for cough, shortness of breath and wheezing.   Cardiovascular: Negative for chest pain, palpitations, orthopnea, claudication and leg swelling.  Gastrointestinal: Negative for abdominal pain, blood in stool, constipation, diarrhea, heartburn, melena, nausea and vomiting.  Genitourinary: Negative.   Musculoskeletal: Negative for joint pain and myalgias.  Skin: Negative  for rash.  Neurological: Negative for dizziness, tingling, sensory change, weakness and headaches.  Endo/Heme/Allergies: Negative for polydipsia.  Psychiatric/Behavioral: Negative.   All other systems reviewed and are negative.     Physical Exam:  There  were no vitals taken for this visit.  General Appearance: Well nourished, in no apparent distress. Eyes: PERRLA, EOMs, conjunctiva no swelling or erythema Sinuses: No Frontal/maxillary tenderness ENT/Mouth: Ext aud canals clear, TMs without erythema, bulging. No erythema, swelling, or exudate on post pharynx.  Tonsils not swollen or erythematous. Hearing normal.  Neck: Supple, thyroid normal.  Respiratory: Respiratory effort normal, BS equal bilaterally without rales, rhonchi, wheezing or stridor.  Cardio: RRR with no MRGs. Brisk peripheral pulses without edema.  Abdomen: Soft, + BS.  Non tender, no guarding, rebound, hernias, masses. Lymphatics: Non tender without lymphadenopathy.  Musculoskeletal: Full ROM, 5/5 strength, normal gait.  Skin: Warm, dry without rashes, lesions, ecchymosis.  Neuro: Cranial nerves intact. Normal muscle tone, no cerebellar symptoms. Sensation intact.  Psych: Awake and oriented X 3, normal affect, Insight and Judgment appropriate.     Christy Ribas, NP 3:23 PM [facility]

## 2017-08-01 ENCOUNTER — Ambulatory Visit: Payer: Self-pay | Admitting: Adult Health

## 2017-08-04 ENCOUNTER — Other Ambulatory Visit: Payer: Self-pay | Admitting: Adult Health

## 2017-08-04 DIAGNOSIS — E669 Obesity, unspecified: Secondary | ICD-10-CM

## 2017-08-04 DIAGNOSIS — Z6836 Body mass index (BMI) 36.0-36.9, adult: Secondary | ICD-10-CM

## 2017-09-06 NOTE — Progress Notes (Deleted)
FOLLOW UP  Assessment and Plan:   Hypertension Well controlled with current medications  Monitor blood pressure at home; patient to call if consistently greater than 130/80 Continue DASH diet.   Reminder to go to the ER if any CP, SOB, nausea, dizziness, severe HA, changes vision/speech, left arm numbness and tingling and jaw pain.  Obesity with co morbidities Long discussion about weight loss, diet, and exercise Recommended diet heavy in fruits and veggies and low in animal meats, cheeses, and dairy products, appropriate calorie intake Discussed ideal weight for height (below ***) and initial weight goal (***) She is prescribed phentermine for weight loss and noted benefit without SE - discussed taking regular drug breaks Patient will work on *** Will follow up in 3 months  History of tobacco use Patient remains successfully off of tobacco Will follow up at the next visit  Needs CPE for next visit***  Continue diet and meds as discussed. Further disposition pending results of labs. Discussed med's effects and SE's.   Over 30 minutes of exam, counseling, chart review, and critical decision making was performed.   Future Appointments  Date Time Provider [place]  09/07/2017  4:30 PM Liane Comber, NP [facility] None    ----------------------------------------------------------------------------------------------------------------------  HPI 47 y.o. female  presents for 2 month follow up on hypertension and obesity, as well as follow up for status on smoking cessation.   she is prescribed phentermine for weight loss.  While on the medication they have lost {NUMBERS 0-12:18577} lbs since last visit. They deny palpitations, anxiety, trouble sleeping, elevated BP.   BMI is There is no height or weight on file to calculate BMI., she is working on diet and exercise. Wt Readings from Last 3 Encounters:  05/30/17 195 lb (88.5 kg)  04/26/17 204 lb (92.5 kg)  11/15/16 193  lb 9.6 oz (87.8 kg)   Typical breakfast: Typical lunch:  Typical dinner: Exercise:  Water intake:   Her blood pressure {HAS HAS NOT:18834} been controlled at home, today their BP is    She {DOES_DOES QMV:78469} workout. She denies chest pain, shortness of breath, dizziness.      Current Medications:  Current Outpatient Medications on File Prior to Visit  Medication Sig  . amLODipine (NORVASC) 10 MG tablet take 1 tablet by mouth once daily  . phentermine (ADIPEX-P) 37.5 MG tablet take 1 tablet by mouth once daily BEFORE BREAKFAST   No current facility-administered medications on file prior to visit.      Allergies:  Allergies  Allergen Reactions  . Lisinopril     Angioedema      Medical History:  Past Medical History:  Diagnosis Date  . Anemia   . Bradycardia    Event monitor 8/13: NSR, sinus bradycardia, sinus tachycardia, no significant bradycardia arrhythmias  . Chest pain 03/22/2012   Normal Myoview, normal echo, chest CT negative for pulmonary embolism  . History of nuclear stress test 03/2012   Myoview 03/23/12: EF 50%, no ischemia or scar.  [name] HTN (hypertension)    2-D echocardiogram 03/22/12: EF 50%, mild LAE, no wall motion abnormalities.  . Tubal pregnancy    In her late 20's  . Vitamin D deficiency    Family history- Reviewed and unchanged Social history- Reviewed and unchanged   Review of Systems:  ROS    Physical Exam: There were no vitals taken for this visit. Wt Readings from Last 3 Encounters:  05/30/17 195 lb (88.5 kg)  04/26/17 204 lb (92.5 kg)  11/15/16 193  lb 9.6 oz (87.8 kg)   General Appearance: Well nourished, in no apparent distress. Eyes: PERRLA, EOMs, conjunctiva no swelling or erythema Sinuses: No Frontal/maxillary tenderness ENT/Mouth: Ext aud canals clear, TMs without erythema, bulging. No erythema, swelling, or exudate on post pharynx.  Tonsils not swollen or erythematous. Hearing normal.  Neck: Supple, thyroid normal.   Respiratory: Respiratory effort normal, BS equal bilaterally without rales, rhonchi, wheezing or stridor.  Cardio: RRR with no MRGs. Brisk peripheral pulses without edema.  Abdomen: Soft, + BS.  Non tender, no guarding, rebound, hernias, masses. Lymphatics: Non tender without lymphadenopathy.  Musculoskeletal: Full ROM, 5/5 strength, {PSY - GAIT AND STATION:22860} gait Skin: Warm, dry without rashes, lesions, ecchymosis.  Neuro: Cranial nerves intact. No cerebellar symptoms.  Psych: Awake and oriented X 3, normal affect, Insight and Judgment appropriate.    Christy Ribas, NP 7:17 PM [facility]

## 2017-09-07 ENCOUNTER — Ambulatory Visit: Payer: Self-pay | Admitting: Adult Health

## 2018-01-04 ENCOUNTER — Other Ambulatory Visit: Payer: Self-pay | Admitting: *Deleted

## 2018-01-04 MED ORDER — AMLODIPINE BESYLATE 10 MG PO TABS: ORAL_TABLET | ORAL | 0 refills | Status: DC

## 2018-01-18 ENCOUNTER — Ambulatory Visit: Payer: Self-pay | Admitting: Internal Medicine

## 2018-01-18 NOTE — Progress Notes (Signed)
NO SHOW

## 2018-01-31 ENCOUNTER — Other Ambulatory Visit: Payer: Self-pay | Admitting: Internal Medicine

## 2019-08-17 DIAGNOSIS — Z9289 Personal history of other medical treatment: Secondary | ICD-10-CM

## 2019-08-17 HISTORY — DX: Personal history of other medical treatment: Z92.89

## 2019-11-15 ENCOUNTER — Encounter (HOSPITAL_COMMUNITY): Payer: Self-pay | Admitting: Emergency Medicine

## 2019-11-15 ENCOUNTER — Other Ambulatory Visit: Payer: Self-pay

## 2019-11-15 ENCOUNTER — Ambulatory Visit (HOSPITAL_COMMUNITY)
Admission: EM | Admit: 2019-11-15 | Discharge: 2019-11-15 | Disposition: A | Discharge status: Home / Self Care | Payer: 59 | Attending: Physician Assistant | Admitting: Physician Assistant

## 2019-11-15 DIAGNOSIS — R1084 Generalized abdominal pain: Secondary | ICD-10-CM | POA: Diagnosis not present

## 2019-11-15 DIAGNOSIS — Z833 Family history of diabetes mellitus: Secondary | ICD-10-CM | POA: Diagnosis not present

## 2019-11-15 DIAGNOSIS — Z87891 Personal history of nicotine dependence: Secondary | ICD-10-CM | POA: Diagnosis not present

## 2019-11-15 DIAGNOSIS — I10 Essential (primary) hypertension: Secondary | ICD-10-CM | POA: Diagnosis not present

## 2019-11-15 DIAGNOSIS — Z8249 Family history of ischemic heart disease and other diseases of the circulatory system: Secondary | ICD-10-CM | POA: Diagnosis not present

## 2019-11-15 DIAGNOSIS — R112 Nausea with vomiting, unspecified: Secondary | ICD-10-CM

## 2019-11-15 DIAGNOSIS — Z20822 Contact with and (suspected) exposure to covid-19: Secondary | ICD-10-CM | POA: Insufficient documentation

## 2019-11-15 DIAGNOSIS — R0981 Nasal congestion: Secondary | ICD-10-CM

## 2019-11-15 DIAGNOSIS — R197 Diarrhea, unspecified: Secondary | ICD-10-CM | POA: Diagnosis not present

## 2019-11-15 DIAGNOSIS — Z888 Allergy status to other drugs, medicaments and biological substances status: Secondary | ICD-10-CM | POA: Insufficient documentation

## 2019-11-15 DIAGNOSIS — Z79899 Other long term (current) drug therapy: Secondary | ICD-10-CM | POA: Insufficient documentation

## 2019-11-15 MED ORDER — FLUTICASONE PROPIONATE 50 MCG/ACT NA SUSP: 1.0000 | Freq: Every day | NASAL | 0 refills | Status: DC

## 2019-11-15 MED ORDER — ONDANSETRON HCL 4 MG PO TABS: 4.0000 mg | ORAL_TABLET | Freq: Three times a day (TID) | ORAL | 0 refills | Status: DC | PRN

## 2019-11-15 MED ORDER — CETIRIZINE HCL 10 MG PO TABS: 10.0000 mg | ORAL_TABLET | Freq: Every day | ORAL | 0 refills | Status: DC

## 2019-11-15 NOTE — Discharge Instructions (Signed)
Take the Zofran up to every 8 hours for nausea I want you to start using the Flonase daily as directed 1 spray each nostril Take Zyrtec daily  If your abdominal pain becomes more severe, you develop fever, worsening nausea or vomiting or severely worsening diarrhea to include having blood or large amounts of mucus in your diarrhea please return.  If you are unable to keep any fluids down for 24 hours I would like for you to go to the emergency department.  If your Covid-19 test is positive, you will receive a phone call from [facility] regarding your results. Negative test results are not called. Both positive and negative results area always visible on MyChart. If you do not have a MyChart account, sign up instructions are in your discharge papers.   Persons who are directed to care for themselves at home may discontinue isolation under the following conditions:   At least 10 days have passed since symptom onset and  At least 24 hours have passed without running a fever (this means without the use of fever-reducing medications) and  Other symptoms have improved.  Persons infected with COVID-19 who never develop symptoms may discontinue isolation and other precautions 10 days after the date of their first positive COVID-19 test.

## 2019-11-15 NOTE — ED Triage Notes (Signed)
Ate at a restaurant Tuesday. Nausea, vomiting, and diarrhea started yesterday. Vomited once since onset.   First covid shot 3/6

## 2019-11-15 NOTE — ED Provider Notes (Addendum)
[place]    CSN: [id number] Arrival date & time: 11/15/19  0809      History   Chief Complaint Chief Complaint  Patient presents with  . Abdominal Pain  . Emesis    HPI Christy Waller is a 49 y.o. female.   Patient presents with 2-day history of abdominal pain, diarrhea and one episode of vomiting this morning.  She reports she ate at a restaurant on Tuesday evening and following this she started to develop abdominal cramping and nausea.  She reports 2-3 episodes of loose watery stool daily since that time.  Denies blood or large amounts of mucus in the stool.  She reports generalized abdominal cramping prior to having to having to use the bathroom.  She reports she does not have this abdominal cramping outside of prior to having to have a bowel movement.  She reports nausea throughout the duration however only one episode of vomiting which occurred this morning.  She reports his vomit was clear and as soon as she woke up.  Denies blood in the vomit.  She still endorses nausea in clinic now.  She does report some recent nasal congestion.  Denies cough, headache, sore throat, change or loss of taste and smell.  She denies any painful urination, frequency urination or urgency of urination.  She reports that he with 8 with her at the restaurant are not experiencing similar symptoms at this time.  No history of diverticulosis or diverticulitis.  Colonoscopy in 2014 without evidence of diverticulosis.     Past Medical History:  Diagnosis Date  . Anemia   . Bradycardia    Event monitor 8/13: NSR, sinus bradycardia, sinus tachycardia, no significant bradycardia arrhythmias  . Chest pain 03/22/2012   Normal Myoview, normal echo, chest CT negative for pulmonary embolism  . History of nuclear stress test 03/2012   Myoview 03/23/12: EF 50%, no ischemia or scar.  [name] HTN (hypertension)    2-D echocardiogram 03/22/12: EF 50%, mild LAE, no wall motion abnormalities.  . Tubal pregnancy     In her late 20's  . Vitamin D deficiency     Patient Active Problem List   Diagnosis Date Noted  . Obesity (BMI 30-39.9) 04/25/2017  . Vitamin D deficiency   . Snoring 04/19/2012  . History of tobacco abuse 03/22/2012  . Bradycardia 03/22/2012  . HTN (hypertension) 03/22/2012    Past Surgical History:  Procedure Laterality Date  . CESAREAN SECTION    . TUBAL LIGATION      OB History   No obstetric history on file.      Home Medications    Prior to Admission medications   Medication Sig Start Date End Date Taking? Authorizing Provider  amLODipine (NORVASC) 10 MG tablet take 1 tablet by mouth once daily 01/04/18  Yes Unk Pinto, MD  cetirizine (ZYRTEC ALLERGY) 10 MG tablet Take 1 tablet (10 mg total) by mouth daily. 11/15/19 12/15/19  Yumiko Alkins, Marguerita Beards, PA-C  fluticasone (FLONASE) 50 MCG/ACT nasal spray Place 1 spray into both nostrils daily. 11/15/19   Gizelle Whetsel, Marguerita Beards, PA-C  ondansetron (ZOFRAN) 4 MG tablet Take 1 tablet (4 mg total) by mouth every 8 (eight) hours as needed for nausea or vomiting. 11/15/19   Andersson Larrabee, Marguerita Beards, PA-C  phentermine (ADIPEX-P) 37.5 MG tablet take 1 tablet by mouth once daily BEFORE BREAKFAST 08/04/17   Liane Comber, NP    Family History Family History  Problem Relation Age of Onset  . Diabetes type II  Sister   . Hypertension Mother   . Hypertension Father   . Hypertension Sister     Social History Social History   Tobacco Use  . Smoking status: Former Smoker    Packs/day: 0.20    Years: 25.00    Pack years: 5.00    Types: Cigarettes    Quit date: 09/29/2016    Years since quitting: 3.1  . Smokeless tobacco: Never Used  Substance Use Topics  . Alcohol use: No  . Drug use: Yes    Types: Marijuana     Allergies   Lisinopril   Review of Systems Review of Systems  Constitutional: Positive for appetite change. Negative for chills and fever.  HENT: Positive for congestion, rhinorrhea and sneezing. Negative for ear pain, sinus  pressure, sinus pain and sore throat.   Eyes: Positive for itching. Negative for photophobia and visual disturbance.  Respiratory: Negative for cough and shortness of breath.   Cardiovascular: Negative for chest pain.  Gastrointestinal: Positive for abdominal pain, diarrhea, nausea and vomiting. Negative for blood in stool and rectal pain.  Genitourinary: Negative for dysuria, flank pain, frequency, hematuria and urgency.  Musculoskeletal: Negative for arthralgias, back pain and myalgias.  Skin: Negative for color change and rash.  Neurological: Negative for headaches.     Physical Exam Triage Vital Signs ED Triage Vitals [11/15/19 0827]  Enc Vitals Group     BP      Pulse      Resp      Temp      Temp src      SpO2      Weight      Height      Head Circumference      Peak Flow      Pain Score 6     Pain Loc      Pain Edu?      Excl. in [place]?    No data found.  Updated Vital Signs BP (!) 153/88   Pulse 78   Temp 97.9 F (36.6 C) (Oral)   Resp 16   SpO2 99%   Visual Acuity Right Eye Distance:   Left Eye Distance:   Bilateral Distance:    Right Eye Near:   Left Eye Near:    Bilateral Near:     Physical Exam Constitutional:      General: She is not in acute distress.    Appearance: She is well-developed. She is not ill-appearing.  HENT:     Head: Normocephalic and atraumatic.     Nose:     Right Turbinates: Swollen.     Left Turbinates: Swollen.     Right Sinus: No maxillary sinus tenderness or frontal sinus tenderness.     Left Sinus: No maxillary sinus tenderness or frontal sinus tenderness.     Comments: Turbinates have erythema.  Clear nasal discharge visible.    Mouth/Throat:     Mouth: Mucous membranes are moist.     Pharynx: Oropharynx is clear.  Eyes:     Extraocular Movements: Extraocular movements intact.     Pupils: Pupils are equal, round, and reactive to light.  Cardiovascular:     Rate and Rhythm: Normal rate and regular rhythm.     Heart  sounds: Normal heart sounds.  Pulmonary:     Effort: Pulmonary effort is normal. No respiratory distress.     Breath sounds: Normal breath sounds. No wheezing or rhonchi.  Abdominal:     General: Abdomen is flat. Bowel  sounds are normal.     Palpations: Abdomen is soft. There is no hepatomegaly or splenomegaly.     Tenderness: There is abdominal tenderness (Mild TTP mid left side of abdomen.  Reported as a 2-3/10.  Otherwise no tenderness in abdomen.). There is no right CVA tenderness, left CVA tenderness, guarding or rebound.     Hernia: No hernia is present.  Musculoskeletal:     Right lower leg: No edema.     Left lower leg: No edema.  Skin:    General: Skin is warm and dry.     Findings: No rash.  Neurological:     General: No focal deficit present.     Mental Status: She is alert.      UC Treatments / Results  Labs (all labs ordered are listed, but only abnormal results are displayed) Labs Reviewed  SARS CORONAVIRUS 2 (TAT 6-24 HRS)    EKG   Radiology No results found.  Procedures Procedures (including critical care time)  Medications Ordered in UC Medications - No data to display  Initial Impression / Assessment and Plan / UC Course  I have reviewed the triage vital signs and the nursing notes.  Pertinent labs & imaging results that were available during my care of the patient were reviewed by me and considered in my medical decision making (see chart for details).     #Nausea vomiting and diarrhea #Nasal congestion Patient is a 49 year old female presenting with cute onset of abdominal pain with associated nausea, vomiting and diarrhea.  She has very mild GI symptoms at this time with infrequent loose stools and only one episode vomiting.  No alarm signs such as severe abdominal cramping, blood in stool or vomit, fever or chills.  Differential would be foodborne vs viral illness at this point.  She does have some associated nasal congestion though this could  be secondary to allergies that she does have sneezing.  Given some upper respiratory and GI symptoms will send Covid PCR.  Patient did have initial vaccination several weeks ago however this does not provide full immunity.  Discussed symptomatic care at this time with patient but if she has worsening symptoms such as severe abdominal cramping, fever or blood in her stool or significant mucus in her stool she should consider returning to urgent care for reevaluation for consideration of invasive diarrhea treatment.  Patient verbalized understanding.  Will send out with Zofran and instructions to increase her liquid intake.   Final Clinical Impressions(s) / UC Diagnoses   Final diagnoses:  Nausea vomiting and diarrhea  Nasal congestion     Discharge Instructions     Take the Zofran up to every 8 hours for nausea I want you to start using the Flonase daily as directed 1 spray each nostril Take Zyrtec daily  If your abdominal pain becomes more severe, you develop fever, worsening nausea or vomiting or severely worsening diarrhea to include having blood or large amounts of mucus in your diarrhea please return.  If you are unable to keep any fluids down for 24 hours I would like for you to go to the emergency department.  If your Covid-19 test is positive, you will receive a phone call from [facility] regarding your results. Negative test results are not called. Both positive and negative results area always visible on MyChart. If you do not have a MyChart account, sign up instructions are in your discharge papers.   Persons who are directed to care for themselves at home  may discontinue isolation under the following conditions:  . At least 10 days have passed since symptom onset and . At least 24 hours have passed without running a fever (this means without the use of fever-reducing medications) and . Other symptoms have improved.  Persons infected with COVID-19 who never develop symptoms  may discontinue isolation and other precautions 10 days after the date of their first positive COVID-19 test.      ED Prescriptions    Medication Sig Dispense Auth. Provider   ondansetron (ZOFRAN) 4 MG tablet Take 1 tablet (4 mg total) by mouth every 8 (eight) hours as needed for nausea or vomiting. 6 tablet Laekyn Rayos, Marguerita Beards, PA-C   fluticasone (FLONASE) 50 MCG/ACT nasal spray Place 1 spray into both nostrils daily. 11.1 mL Annaleise Burger, Marguerita Beards, PA-C   cetirizine (ZYRTEC ALLERGY) 10 MG tablet Take 1 tablet (10 mg total) by mouth daily. 30 tablet Rashawn Rolon, Marguerita Beards, PA-C     PDMP not reviewed this encounter.   Purnell Shoemaker, PA-C 11/15/19 0916    Rai Sinagra, Marguerita Beards, PA-C 11/15/19 0917    Fredrica Capano, Marguerita Beards, PA-C 11/15/19 [phone]

## 2019-11-16 LAB — SARS CORONAVIRUS 2 (TAT 6-24 HRS): SARS Coronavirus 2: NEGATIVE

## 2019-12-10 ENCOUNTER — Other Ambulatory Visit: Payer: Self-pay

## 2019-12-10 ENCOUNTER — Encounter (HOSPITAL_COMMUNITY): Payer: Self-pay

## 2019-12-10 ENCOUNTER — Ambulatory Visit (HOSPITAL_COMMUNITY)
Admission: EM | Admit: 2019-12-10 | Discharge: 2019-12-10 | Disposition: A | Discharge status: Home / Self Care | Payer: 59 | Attending: Family Medicine | Admitting: Family Medicine

## 2019-12-10 DIAGNOSIS — I10 Essential (primary) hypertension: Secondary | ICD-10-CM

## 2019-12-10 DIAGNOSIS — M25512 Pain in left shoulder: Secondary | ICD-10-CM

## 2019-12-10 DIAGNOSIS — G8929 Other chronic pain: Secondary | ICD-10-CM | POA: Diagnosis not present

## 2019-12-10 MED ORDER — MELOXICAM 15 MG PO TABS: 15.0000 mg | ORAL_TABLET | Freq: Every day | ORAL | 0 refills | Status: DC

## 2019-12-10 NOTE — Discharge Instructions (Signed)
Your blood pressure was noted to be elevated during your visit today. If you are currently taking medication for high blood pressure, please ensure you are taking this as directed. If you do not have a history of high blood pressure and your blood pressure remains persistently elevated, you may need to begin taking a medication at some point. You may return here within the next few days to recheck if unable to see your primary care provider or if do not have a one.  BP (!) 166/93 (BP Location: Right Arm)   Pulse 66   Temp 98.5 F (36.9 C) (Oral)   Resp 16   SpO2 100%

## 2019-12-10 NOTE — ED Triage Notes (Signed)
Patient reports left sided shoulder pain. Reports this is a chronic issue, but exacerbated it by sleeping on that side last night.

## 2019-12-10 NOTE — ED Provider Notes (Signed)
[name]   [id number] 12/10/19 Arrival Time: [id number]  ASSESSMENT & PLAN:  1. Chronic left shoulder pain   2. Elevated blood pressure reading in office with diagnosis of hypertension     No indication for plain imaging at this time. Encourage ROM of L shoulder as she tolerates. Work note provided.  Meds ordered this encounter  Medications  . meloxicam (MOBIC) 15 MG tablet    Sig: Take 1 tablet (15 mg total) by mouth daily.    Dispense:  14 tablet    Refill:  0    Recommend: Follow-up Information    [facility].   Why: If worsening or failing to improve as anticipated. Contact information: [address] [place] [id number]            Discharge Instructions     Your blood pressure was noted to be elevated during your visit today. If you are currently taking medication for high blood pressure, please ensure you are taking this as directed. If you do not have a history of high blood pressure and your blood pressure remains persistently elevated, you may need to begin taking a medication at some point. You may return here within the next few days to recheck if unable to see your primary care provider or if do not have a one.  BP (!) 166/93 (BP Location: Right Arm)   Pulse 66   Temp 98.5 F (36.9 C) (Oral)   Resp 16   SpO2 100%        [place] Controlled Substances Registry consulted for this patient. I feel the risk/benefit ratio today is favorable for proceeding with this prescription for a controlled substance. Medication sedation precautions given.  Reviewed expectations re: course of current medical issues. Questions answered. Outlined signs and symptoms indicating need for more acute intervention. Patient verbalized understanding. After Visit Summary given.  SUBJECTIVE: History from: patient. Christy Waller is a 49 y.o. R-handed female who reports fairly persistent moderate pain of her  left shoulder; described as aching and throbbing; without radiation. Onset: gradual. First noted: approx 2 mo ago. Injury/trama: no. Does work at a [employer]; repetitive tasks; questions relation. Symptoms have waxed and waned since beginning. Aggravating factors: certain movements. Alleviating factors: rest. Associated symptoms: none reported. Extremity sensation changes or weakness: none. Self treatment: has not tried OTC therapies.  History of similar: no.  Past Surgical History:  Procedure Laterality Date  . CESAREAN SECTION    . TUBAL LIGATION      Increased blood pressure noted today. Reports that she is treated for HTN.  She reports taking medications as instructed, no chest pain on exertion, no dyspnea on exertion, no swelling of ankles, no orthostatic dizziness or lightheadedness, no orthopnea or paroxysmal nocturnal dyspnea and no palpitations.   OBJECTIVE:  Vitals:   12/10/19 1240  BP: (!) 166/93  Pulse: 66  Resp: 16  Temp: 98.5 F (36.9 C)  TempSrc: Oral  SpO2: 100%    General appearance: alert; no distress HEENT: [place]; AT Neck: supple with FROM Resp: unlabored respirations Extremities: . LUE: warm with well perfused appearance; poorly localized moderate tenderness over left superior/anterior shoulder; more pain with internal rotation; without gross deformities; swelling: none; bruising: none; shoulder ROM: limited by reported pain CV: brisk extremity capillary refill of LUE; 2+ radial pulse of LUE. Skin: warm and dry; no visible rashes Neurologic: gait normal; normal sensation and strength of LUE Psychological: alert and cooperative; normal  mood and affect    Allergies  Allergen Reactions  . Lisinopril     Angioedema     Past Medical History:  Diagnosis Date  . Anemia   . Bradycardia    Event monitor 8/13: NSR, sinus bradycardia, sinus tachycardia, no significant bradycardia arrhythmias  . Chest pain 03/22/2012   Normal Myoview, normal echo,  chest CT negative for pulmonary embolism  . History of nuclear stress test 03/2012   Myoview 03/23/12: EF 50%, no ischemia or scar.  [name] HTN (hypertension)    2-D echocardiogram 03/22/12: EF 50%, mild LAE, no wall motion abnormalities.  . Tubal pregnancy    In her late 20's  . Vitamin D deficiency    Social History   Socioeconomic History  . Marital status: Divorced    Spouse name: Not on file  . Number of children: 2  . Years of education: Not on file  . Highest education level: Not on file  Occupational History  . Occupation: packer/driver    Comment: [employer]  Tobacco Use  . Smoking status: Former Smoker    Packs/day: 0.20    Years: 25.00    Pack years: 5.00    Types: Cigarettes    Quit date: 09/29/2016    Years since quitting: 3.1  . Smokeless tobacco: Never Used  Substance and Sexual Activity  . Alcohol use: No  . Drug use: Yes    Types: Marijuana  . Sexual activity: Yes  Other Topics Concern  . Not on file  Social History Narrative  . Not on file   Social Determinants of Health   Financial Resource Strain:   . Difficulty of Paying Living Expenses:   Food Insecurity:   . Worried About Charity fundraiser in the Last Year:   . Arboriculturist in the Last Year:   Transportation Needs:   . Film/video editor (Medical):   [name] Lack of Transportation (Non-Medical):   Physical Activity:   . Days of Exercise per Week:   . Minutes of Exercise per Session:   Stress:   . Feeling of Stress :   Social Connections:   . Frequency of Communication with Friends and Family:   . Frequency of Social Gatherings with Friends and Family:   . Attends Religious Services:   . Active Member of Clubs or Organizations:   . Attends Archivist Meetings:   [name] Marital Status:    Family History  Problem Relation Age of Onset  . Diabetes type II Sister   . Hypertension Mother   . Hypertension Father   . Hypertension Sister    Past Surgical History:  Procedure  Laterality Date  . CESAREAN SECTION    . TUBAL LIGATION        Vanessa Kick, MD 12/10/19 1409

## 2020-04-16 DIAGNOSIS — C801 Malignant (primary) neoplasm, unspecified: Secondary | ICD-10-CM

## 2020-04-16 HISTORY — DX: Malignant (primary) neoplasm, unspecified: C80.1

## 2020-04-23 ENCOUNTER — Telehealth: Payer: Self-pay | Admitting: *Deleted

## 2020-04-23 NOTE — Telephone Encounter (Signed)
Called and spoke with the patient regarding her referral. Patient scheduled on 9/10 at 11:15 am with Dr Berline Lopes. Gave the patient the address and phone number of the clinic. Also gave the patient the policy for mask, visitors and parking.

## 2020-04-24 NOTE — Progress Notes (Signed)
GYNECOLOGIC ONCOLOGY NEW PATIENT CONSULTATION   Patient Name: Christy Waller  Patient Age: 49 y.o. 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  We reviewed the nature of endometrial cancer and its recommended surgical staging, including total hysterectomy, bilateral salpingo-oophorectomy, and lymph node assessment. The patient is a suitable candidate for staging via a minimally invasive approach to surgery.  We reviewed that robotic assistance would be used to complete the surgery.   We discussed that most endometrial cancer is detected early and that decisions regarding adjuvant therapy will be made based on her final pathology.   We reviewed the sentinel lymph node technique. Risks and benefits of sentinel lymph node biopsy was reviewed. We reviewed the technique and ICG dye. The patient DOES NOT have an iodine allergy or known liver dysfunction. We reviewed the false negative rate (0.4%), and that 3% of patients with metastatic disease will not have it detected by SLN biopsy in endometrial cancer. A low risk of allergic reaction to the dye, <0.2% for ICG, has been reported. We also discussed that in the case of failed mapping, which occurs 40% of the time, a bilateral or unilateral lymphadenectomy will be performed at the surgeon's discretion.   Potential benefits of sentinel nodes including a higher detection rate for metastasis due to ultrastaging and potential reduction in operative morbidity. However, there remains uncertainty as to the role for treatment of micrometastatic disease. Further, the benefit of operative morbidity associated with the SLN technique in endometrial cancer is not yet completely known. In other patient populations (e.g. the cervical cancer population) there has been observed reductions  in morbidity with SLN biopsy compared to pelvic lymphadenectomy. Lymphedema, nerve dysfunction and lymphocysts are all potential risks with the SLN technique as with complete lymphadenectomy. Additional risks to the patient include the risk of damage to an internal organ while operating in an altered view (e.g. the black and white image of the robotic fluorescence imaging mode).   The patient was consented for a robotic assisted hysterectomy, bilateral salpingo-oophorectomy, sentinel lymph node evaluation, possible lymph node dissection, possible laparotomy. The risks of surgery were discussed in detail and she understands these to include infection; wound separation; hernia; vaginal cuff separation, injury to adjacent organs such as bowel, bladder, blood vessels, ureters and nerves; bleeding which may require blood transfusion; anesthesia risk; thromboembolic events; possible death; unforeseen complications; possible need for re-exploration; medical complications such as heart attack, stroke, pleural effusion and pneumonia; and, if full lymphadenectomy is performed the risk of lymphedema and lymphocyst. The patient will receive DVT and antibiotic prophylaxis as indicated. She voiced a clear understanding. She had the opportunity to ask questions. Perioperative instructions were reviewed with her. Prescriptions for post-op medications were sent to her pharmacy of choice.  The patient continues to endorse some symptoms of lightheadedness and dizziness.  It looks like she has been seen on multiple occasions since 2013 for similar symptoms.  She underwent an extensive cardiology work-up in 2013.  She was last seen by cardiology in 2018.  EKG today is notable for sinus bradycardia.  Given no change to her symptoms, will send surgical clearance form to her primary care provider.  If PCP feels that repeat cardiac evaluation and clearance is necessary, then we will delay surgery.  Tentatively, we plan for surgery next  Thursday on the 16th.  The patient surgical  history is notable for 2 C-sections as well as surgery for an ectopic pregnancy.  We discussed that she may have significant adhesive disease and scar tissue related to her surgical history.  She understands that there is a risk that she will need a mini lap or laparotomy either for specimen removal or to complete surgery.  A copy of this note was sent to the patient's referring provider.   52 minutes of total time was spent for this patient encounter, including preparation, face-to-face counseling with the patient and coordination of care, and documentation of the encounter.   Jeral Pinch, MD  Division of Gynecologic Oncology  Department of Obstetrics and Gynecology  [facility]  ___________________________________________  Chief Complaint: No chief complaint on file.   History of Present Illness:  Christy Waller is a 49 y.o. y.o. female who is seen in consultation at the request of Dr. Delora Fuel for an evaluation of endometrial adenocarcinoma.  The patient reports regular monthly menses until the last year or 2.  Since that time, she notes that her cycles have become constant.  She has bleeding every day, which she describes as sometimes being spotting, sometimes light bleeding, sometimes heavy bleeding, and sometimes passage of clots.  She also has daily cramping mostly on her right side and right low pelvic area.  She takes equate for pain when it becomes unmanageable.  She initially had reached out to her primary care provider who thought she may be perimenopausal.  Subsequently, she was referred to an OB/GYN and underwent endometrial biopsy showing grade 2 endometrial adenocarcinoma.  Patient's medical history is notable for hypertension.  She has a history of obesity, weighing almost 250 pounds 2 years ago.  Last year she was able to decrease her weight to 188 pounds and currently she is 148.  She is watching  her diet closely and walks daily.  Over the last week or 2, she reports that her appetite has been up and down.  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  She has a large meal for dinner when she gets home.  She endorses continued occasional lightheadedness and dizziness.  She feels very tired and weak after she gets home from work.  She denies any urinary symptoms and reports regular bowel function.  Patient saw cardiology last in April 2018.  She has a history of hypertension and diabetes.  She was initially evaluated in 2013 when she presented with chest pain.  She underwent an echocardiogram in August 2013 that showed an ejection fraction of 50%.  Myoview showed an EF of 50%, no ischemia or scar tissue.  CT angiogram of her chest at that time was negative for pulmonary embolism.  She was noted to have sinus bradycardia with heart rates as low as the 40s.  She had an event monitor that showed no significant bradycardia arrhythmias.  She has been evaluated in the emergency department multiple times for near syncope as well as dizziness.  PAST MEDICAL HISTORY:  Past Medical History:  Diagnosis Date  . Anemia   . Bradycardia    Event monitor 8/13: NSR, sinus bradycardia, sinus tachycardia, no significant bradycardia arrhythmias  . Chest pain 03/22/2012   Normal Myoview, normal echo, chest CT negative for pulmonary embolism  . History of nuclear stress test 03/2012   Myoview 03/23/12: EF 50%, no ischemia or scar.  [name] HTN (hypertension)    2-D echocardiogram 03/22/12: EF 50%, mild LAE, no wall  motion abnormalities.  . Tubal pregnancy    In her late 20's  . Vitamin D deficiency      PAST SURGICAL HISTORY:  Past Surgical History:  Procedure Laterality Date  . CESAREAN SECTION     x2  . ECTOPIC PREGNANCY SURGERY    . TUBAL LIGATION      OB/GYN HISTORY:  OB History  Gravida Para Term Preterm AB Living  3 2       2   SAB TAB Ectopic Multiple Live Births                # Outcome Date GA Lbr Len/2nd Weight Sex Delivery Anes PTL Lv  3 Gravida           2 Para           1 Para             No LMP recorded. (Menstrual status: Perimenopausal).  Age at menarche: 88  Age at menopause: n/a Hx of HRT: n/a Hx of STDs: denies Last pap: 04/2020 History of abnormal pap smears: Reports a remote history of an abnormal Pap smear in the late 90s.  She underwent biopsy which she describes as "normal".  She denies any cervical procedures being necessary.  Her Pap smears since have been normal.    SCREENING STUDIES:  Last mammogram: 2014  Last colonoscopy: 2014  MEDICATIONS: Outpatient Encounter Medications as of 04/25/2020  Medication Sig  . amLODipine (NORVASC) 10 MG tablet Take 1 tablet by mouth daily.  [name] ibuprofen (ADVIL) 800 MG tablet Take 1 tablet (800 mg total) by mouth every 8 (eight) hours as needed for moderate pain. For AFTER surgery  . oxyCODONE (OXY IR/ROXICODONE) 5 MG immediate release tablet Take 1 tablet (5 mg total) by mouth every 4 (four) hours as needed for severe pain. For AFTER surgery, do not take and drive  . senna-docusate (SENOKOT-S) 8.6-50 MG tablet Take 2 tablets by mouth at bedtime. For AFTER surgery, do not take if having diarrhea  . [DISCONTINUED] amLODipine (NORVASC) 10 MG tablet take 1 tablet by mouth once daily  . [DISCONTINUED] cetirizine (ZYRTEC ALLERGY) 10 MG tablet Take 1 tablet (10 mg total) by mouth daily.  . [DISCONTINUED] fluticasone (FLONASE) 50 MCG/ACT nasal spray Place 1 spray into both nostrils daily.  . [DISCONTINUED] meloxicam (MOBIC) 15 MG tablet Take 1 tablet (15 mg total) by mouth daily.  . [DISCONTINUED] ondansetron (ZOFRAN) 4 MG tablet Take 1 tablet (4 mg total) by mouth every 8 (eight) hours as needed for nausea or vomiting.  . [DISCONTINUED] phentermine (ADIPEX-P) 37.5 MG tablet take 1 tablet by mouth once daily BEFORE BREAKFAST   No facility-administered encounter medications on file as of 04/25/2020.     ALLERGIES:  Allergies  Allergen Reactions  . Lisinopril Swelling    Angioedema  Angioedema     FAMILY HISTORY:  Family History  Problem Relation Age of Onset  . Diabetes type II Sister   . Hypertension Mother   . Hypertension Father   . Hypertension Sister   . Breast cancer Maternal Aunt   . Breast cancer Cousin        maternal cousin     SOCIAL HISTORY:    Social Connections:   . Frequency of Communication with Friends and Family: Not on file  . Frequency of Social Gatherings with Friends and Family: Not on file  . Attends Religious Services: Not on file  . Active Member of Clubs or Organizations: Not on file  .  Attends Archivist Meetings: Not on file  . Marital Status: Not on file    REVIEW OF SYSTEMS:  Endorses appetite changes, chills, fatigue, abdominal pain, hot flashes, menstrual problems, pelvic pain, vaginal bleeding, back pain, muscle pain/cramps, dizziness. Denies fevers, unexplained weight changes. Denies hearing loss, neck lumps or masses, mouth sores, ringing in ears or voice changes. Denies cough or wheezing.  Denies shortness of breath. Denies chest pain or palpitations. Denies leg swelling. Denies abdominal distention, blood in stools, constipation, diarrhea, nausea, vomiting, or early satiety. Denies pain with intercourse, dysuria, frequency, hematuria or incontinence.   Denies joint pain. Denies itching, rash, or wounds. Denies headaches, numbness or seizures. Denies swollen lymph nodes or glands, denies easy bruising or bleeding. Denies anxiety, depression, confusion, or decreased concentration.  Physical Exam:  Vital Signs for this encounter:  Blood pressure (!) 146/85, pulse (!) 58, temperature (!) 97.2 F (36.2 C), temperature source Tympanic, resp. rate 16, height 5\' 2"  (1.575 m), weight 148 lb (67.1 kg). Body mass index is 27.07 kg/m. General: Alert, oriented, no acute distress.  HEENT: Normocephalic, atraumatic. Sclera  anicteric.  Chest: Clear to auscultation bilaterally. No wheezes, rhonchi, or rales. Cardiovascular: Regular rate and rhythm, II/VI systolic murmur, no rubs or gallops.  Abdomen: Normoactive bowel sounds. Soft, nondistended, nontender to palpation. No masses or hepatosplenomegaly appreciated. No palpable fluid wave.  Well-healed Pfannenstiel incision. Extremities: Grossly normal range of motion. Warm, well perfused. No edema bilaterally.  Skin: No rashes or lesions.  Lymphatics: No cervical, supraclavicular, or inguinal adenopathy.  GU:  Normal external female genitalia. No lesions. No discharge or bleeding.             Bladder/urethra:  No lesions or masses, well supported bladder             Vagina: Well rugated, no discharge or bleeding.             Cervix: Normal appearing, no lesions.             Uterus: 10cm, moderately mobile, no parametrial involvement or nodularity.             Adnexa: No masses appreciated.  Rectal: No nodularity.  LABORATORY AND RADIOLOGIC DATA:  Outside medical records were reviewed to synthesize the above history, along with the history and physical obtained during the visit.   Lab Results  Component Value Date   WBC 8.2 04/26/2017   HGB 12.7 04/26/2017   HCT 39.6 04/26/2017   PLT 273 04/26/2017   GLUCOSE 84 04/26/2017   CHOL 148 10/06/2016   TRIG 83 10/06/2016   HDL 67 10/06/2016   LDLCALC 64 10/06/2016   ALT 7 10/06/2016   AST 13 10/06/2016   NA 138 04/26/2017   K 3.9 04/26/2017   CL 106 04/26/2017   CREATININE 0.88 04/26/2017   BUN 19 04/26/2017   CO2 25 04/26/2017   TSH 1.44 10/06/2016   HGBA1C 5.1 10/06/2016   MICROALBUR 3.1 10/06/2016   Pathology EMB from 9/2: Grade 2 endometrioid adenocarcinoma  Outside lab work performed at the beginning of September: WBC 8.9, hemoglobin 12.8, hematocrit 37.2, platelets 15.3 Gonorrhea and Chlamydia not detected RPR nonreactive TSH 1.34, free T4 0.85 HIV screen nonreactive

## 2020-04-24 NOTE — H&P (View-Only) (Signed)
GYNECOLOGIC ONCOLOGY NEW PATIENT CONSULTATION   Patient Name: Christy Waller  Patient Age: 49 y.o. 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  We reviewed the nature of endometrial cancer and its recommended surgical staging, including total hysterectomy, bilateral salpingo-oophorectomy, and lymph node assessment. The patient is a suitable candidate for staging via a minimally invasive approach to surgery.  We reviewed that robotic assistance would be used to complete the surgery.   We discussed that most endometrial cancer is detected early and that decisions regarding adjuvant therapy will be made based on her final pathology.   We reviewed the sentinel lymph node technique. Risks and benefits of sentinel lymph node biopsy was reviewed. We reviewed the technique and ICG dye. The patient DOES NOT have an iodine allergy or known liver dysfunction. We reviewed the false negative rate (0.4%), and that 3% of patients with metastatic disease will not have it detected by SLN biopsy in endometrial cancer. A low risk of allergic reaction to the dye, <0.2% for ICG, has been reported. We also discussed that in the case of failed mapping, which occurs 40% of the time, a bilateral or unilateral lymphadenectomy will be performed at the surgeon's discretion.   Potential benefits of sentinel nodes including a higher detection rate for metastasis due to ultrastaging and potential reduction in operative morbidity. However, there remains uncertainty as to the role for treatment of micrometastatic disease. Further, the benefit of operative morbidity associated with the SLN technique in endometrial cancer is not yet completely known. In other patient populations (e.g. the cervical cancer population) there has been observed reductions  in morbidity with SLN biopsy compared to pelvic lymphadenectomy. Lymphedema, nerve dysfunction and lymphocysts are all potential risks with the SLN technique as with complete lymphadenectomy. Additional risks to the patient include the risk of damage to an internal organ while operating in an altered view (e.g. the black and white image of the robotic fluorescence imaging mode).   The patient was consented for a robotic assisted hysterectomy, bilateral salpingo-oophorectomy, sentinel lymph node evaluation, possible lymph node dissection, possible laparotomy. The risks of surgery were discussed in detail and she understands these to include infection; wound separation; hernia; vaginal cuff separation, injury to adjacent organs such as bowel, bladder, blood vessels, ureters and nerves; bleeding which may require blood transfusion; anesthesia risk; thromboembolic events; possible death; unforeseen complications; possible need for re-exploration; medical complications such as heart attack, stroke, pleural effusion and pneumonia; and, if full lymphadenectomy is performed the risk of lymphedema and lymphocyst. The patient will receive DVT and antibiotic prophylaxis as indicated. She voiced a clear understanding. She had the opportunity to ask questions. Perioperative instructions were reviewed with her. Prescriptions for post-op medications were sent to her pharmacy of choice.  The patient continues to endorse some symptoms of lightheadedness and dizziness.  It looks like she has been seen on multiple occasions since 2013 for similar symptoms.  She underwent an extensive cardiology work-up in 2013.  She was last seen by cardiology in 2018.  EKG today is notable for sinus bradycardia.  Given no change to her symptoms, will send surgical clearance form to her primary care provider.  If PCP feels that repeat cardiac evaluation and clearance is necessary, then we will delay surgery.  Tentatively, we plan for surgery next  Thursday on the 16th.  The patient surgical  history is notable for 2 C-sections as well as surgery for an ectopic pregnancy.  We discussed that she may have significant adhesive disease and scar tissue related to her surgical history.  She understands that there is a risk that she will need a mini lap or laparotomy either for specimen removal or to complete surgery.  A copy of this note was sent to the patient's referring provider.   52 minutes of total time was spent for this patient encounter, including preparation, face-to-face counseling with the patient and coordination of care, and documentation of the encounter.   Jeral Pinch, MD  Division of Gynecologic Oncology  Department of Obstetrics and Gynecology  [facility]  ___________________________________________  Chief Complaint: No chief complaint on file.   History of Present Illness:  Christy Waller is a 49 y.o. y.o. female who is seen in consultation at the request of Dr. Delora Fuel for an evaluation of endometrial adenocarcinoma.  The patient reports regular monthly menses until the last year or 2.  Since that time, she notes that her cycles have become constant.  She has bleeding every day, which she describes as sometimes being spotting, sometimes light bleeding, sometimes heavy bleeding, and sometimes passage of clots.  She also has daily cramping mostly on her right side and right low pelvic area.  She takes equate for pain when it becomes unmanageable.  She initially had reached out to her primary care provider who thought she may be perimenopausal.  Subsequently, she was referred to an OB/GYN and underwent endometrial biopsy showing grade 2 endometrial adenocarcinoma.  Patient's medical history is notable for hypertension.  She has a history of obesity, weighing almost 250 pounds 2 years ago.  Last year she was able to decrease her weight to 188 pounds and currently she is 148.  She is watching  her diet closely and walks daily.  Over the last week or 2, she reports that her appetite has been up and down.  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  She has a large meal for dinner when she gets home.  She endorses continued occasional lightheadedness and dizziness.  She feels very tired and weak after she gets home from work.  She denies any urinary symptoms and reports regular bowel function.  Patient saw cardiology last in April 2018.  She has a history of hypertension and diabetes.  She was initially evaluated in 2013 when she presented with chest pain.  She underwent an echocardiogram in August 2013 that showed an ejection fraction of 50%.  Myoview showed an EF of 50%, no ischemia or scar tissue.  CT angiogram of her chest at that time was negative for pulmonary embolism.  She was noted to have sinus bradycardia with heart rates as low as the 40s.  She had an event monitor that showed no significant bradycardia arrhythmias.  She has been evaluated in the emergency department multiple times for near syncope as well as dizziness.  PAST MEDICAL HISTORY:  Past Medical History:  Diagnosis Date  . Anemia   . Bradycardia    Event monitor 8/13: NSR, sinus bradycardia, sinus tachycardia, no significant bradycardia arrhythmias  . Chest pain 03/22/2012   Normal Myoview, normal echo, chest CT negative for pulmonary embolism  . History of nuclear stress test 03/2012   Myoview 03/23/12: EF 50%, no ischemia or scar.  [name] HTN (hypertension)    2-D echocardiogram 03/22/12: EF 50%, mild LAE, no wall  motion abnormalities.  . Tubal pregnancy    In her late 20's  . Vitamin D deficiency      PAST SURGICAL HISTORY:  Past Surgical History:  Procedure Laterality Date  . CESAREAN SECTION     x2  . ECTOPIC PREGNANCY SURGERY    . TUBAL LIGATION      OB/GYN HISTORY:  OB History  Gravida Para Term Preterm AB Living  3 2       2   SAB TAB Ectopic Multiple Live Births                # Outcome Date GA Lbr Len/2nd Weight Sex Delivery Anes PTL Lv  3 Gravida           2 Para           1 Para             No LMP recorded. (Menstrual status: Perimenopausal).  Age at menarche: 7  Age at menopause: n/a Hx of HRT: n/a Hx of STDs: denies Last pap: 04/2020 History of abnormal pap smears: Reports a remote history of an abnormal Pap smear in the late 90s.  She underwent biopsy which she describes as "normal".  She denies any cervical procedures being necessary.  Her Pap smears since have been normal.    SCREENING STUDIES:  Last mammogram: 2014  Last colonoscopy: 2014  MEDICATIONS: Outpatient Encounter Medications as of 04/25/2020  Medication Sig  . amLODipine (NORVASC) 10 MG tablet Take 1 tablet by mouth daily.  [name] ibuprofen (ADVIL) 800 MG tablet Take 1 tablet (800 mg total) by mouth every 8 (eight) hours as needed for moderate pain. For AFTER surgery  . oxyCODONE (OXY IR/ROXICODONE) 5 MG immediate release tablet Take 1 tablet (5 mg total) by mouth every 4 (four) hours as needed for severe pain. For AFTER surgery, do not take and drive  . senna-docusate (SENOKOT-S) 8.6-50 MG tablet Take 2 tablets by mouth at bedtime. For AFTER surgery, do not take if having diarrhea  . [DISCONTINUED] amLODipine (NORVASC) 10 MG tablet take 1 tablet by mouth once daily  . [DISCONTINUED] cetirizine (ZYRTEC ALLERGY) 10 MG tablet Take 1 tablet (10 mg total) by mouth daily.  . [DISCONTINUED] fluticasone (FLONASE) 50 MCG/ACT nasal spray Place 1 spray into both nostrils daily.  . [DISCONTINUED] meloxicam (MOBIC) 15 MG tablet Take 1 tablet (15 mg total) by mouth daily.  . [DISCONTINUED] ondansetron (ZOFRAN) 4 MG tablet Take 1 tablet (4 mg total) by mouth every 8 (eight) hours as needed for nausea or vomiting.  . [DISCONTINUED] phentermine (ADIPEX-P) 37.5 MG tablet take 1 tablet by mouth once daily BEFORE BREAKFAST   No facility-administered encounter medications on file as of 04/25/2020.     ALLERGIES:  Allergies  Allergen Reactions  . Lisinopril Swelling    Angioedema  Angioedema     FAMILY HISTORY:  Family History  Problem Relation Age of Onset  . Diabetes type II Sister   . Hypertension Mother   . Hypertension Father   . Hypertension Sister   . Breast cancer Maternal Aunt   . Breast cancer Cousin        maternal cousin     SOCIAL HISTORY:    Social Connections:   . Frequency of Communication with Friends and Family: Not on file  . Frequency of Social Gatherings with Friends and Family: Not on file  . Attends Religious Services: Not on file  . Active Member of Clubs or Organizations: Not on file  .  Attends Archivist Meetings: Not on file  . Marital Status: Not on file    REVIEW OF SYSTEMS:  Endorses appetite changes, chills, fatigue, abdominal pain, hot flashes, menstrual problems, pelvic pain, vaginal bleeding, back pain, muscle pain/cramps, dizziness. Denies fevers, unexplained weight changes. Denies hearing loss, neck lumps or masses, mouth sores, ringing in ears or voice changes. Denies cough or wheezing.  Denies shortness of breath. Denies chest pain or palpitations. Denies leg swelling. Denies abdominal distention, blood in stools, constipation, diarrhea, nausea, vomiting, or early satiety. Denies pain with intercourse, dysuria, frequency, hematuria or incontinence.   Denies joint pain. Denies itching, rash, or wounds. Denies headaches, numbness or seizures. Denies swollen lymph nodes or glands, denies easy bruising or bleeding. Denies anxiety, depression, confusion, or decreased concentration.  Physical Exam:  Vital Signs for this encounter:  Blood pressure (!) 146/85, pulse (!) 58, temperature (!) 97.2 F (36.2 C), temperature source Tympanic, resp. rate 16, height 5\' 2"  (1.575 m), weight 148 lb (67.1 kg). Body mass index is 27.07 kg/m. General: Alert, oriented, no acute distress.  HEENT: Normocephalic, atraumatic. Sclera  anicteric.  Chest: Clear to auscultation bilaterally. No wheezes, rhonchi, or rales. Cardiovascular: Regular rate and rhythm, II/VI systolic murmur, no rubs or gallops.  Abdomen: Normoactive bowel sounds. Soft, nondistended, nontender to palpation. No masses or hepatosplenomegaly appreciated. No palpable fluid wave.  Well-healed Pfannenstiel incision. Extremities: Grossly normal range of motion. Warm, well perfused. No edema bilaterally.  Skin: No rashes or lesions.  Lymphatics: No cervical, supraclavicular, or inguinal adenopathy.  GU:  Normal external female genitalia. No lesions. No discharge or bleeding.             Bladder/urethra:  No lesions or masses, well supported bladder             Vagina: Well rugated, no discharge or bleeding.             Cervix: Normal appearing, no lesions.             Uterus: 10cm, moderately mobile, no parametrial involvement or nodularity.             Adnexa: No masses appreciated.  Rectal: No nodularity.  LABORATORY AND RADIOLOGIC DATA:  Outside medical records were reviewed to synthesize the above history, along with the history and physical obtained during the visit.   Lab Results  Component Value Date   WBC 8.2 04/26/2017   HGB 12.7 04/26/2017   HCT 39.6 04/26/2017   PLT 273 04/26/2017   GLUCOSE 84 04/26/2017   CHOL 148 10/06/2016   TRIG 83 10/06/2016   HDL 67 10/06/2016   LDLCALC 64 10/06/2016   ALT 7 10/06/2016   AST 13 10/06/2016   NA 138 04/26/2017   K 3.9 04/26/2017   CL 106 04/26/2017   CREATININE 0.88 04/26/2017   BUN 19 04/26/2017   CO2 25 04/26/2017   TSH 1.44 10/06/2016   HGBA1C 5.1 10/06/2016   MICROALBUR 3.1 10/06/2016   Pathology EMB from 9/2: Grade 2 endometrioid adenocarcinoma  Outside lab work performed at the beginning of September: WBC 8.9, hemoglobin 12.8, hematocrit 37.2, platelets 15.3 Gonorrhea and Chlamydia not detected RPR nonreactive TSH 1.34, free T4 0.85 HIV screen nonreactive

## 2020-04-25 ENCOUNTER — Other Ambulatory Visit: Payer: Self-pay

## 2020-04-25 ENCOUNTER — Encounter: Payer: Self-pay | Admitting: Gynecologic Oncology

## 2020-04-25 ENCOUNTER — Inpatient Hospital Stay: Payer: 59 | Attending: Gynecologic Oncology | Admitting: Gynecologic Oncology

## 2020-04-25 ENCOUNTER — Other Ambulatory Visit: Payer: Self-pay | Admitting: Gynecologic Oncology

## 2020-04-25 ENCOUNTER — Inpatient Hospital Stay: Payer: 59

## 2020-04-25 VITALS — BP 146/85 | HR 58 | Temp 97.2°F | Resp 16 | Ht 62.0 in | Wt 148.0 lb

## 2020-04-25 DIAGNOSIS — Z833 Family history of diabetes mellitus: Secondary | ICD-10-CM | POA: Insufficient documentation

## 2020-04-25 DIAGNOSIS — Z79899 Other long term (current) drug therapy: Secondary | ICD-10-CM | POA: Insufficient documentation

## 2020-04-25 DIAGNOSIS — Z803 Family history of malignant neoplasm of breast: Secondary | ICD-10-CM | POA: Diagnosis not present

## 2020-04-25 DIAGNOSIS — C541 Malignant neoplasm of endometrium: Secondary | ICD-10-CM

## 2020-04-25 DIAGNOSIS — Z23 Encounter for immunization: Secondary | ICD-10-CM

## 2020-04-25 DIAGNOSIS — E669 Obesity, unspecified: Secondary | ICD-10-CM | POA: Diagnosis not present

## 2020-04-25 DIAGNOSIS — E119 Type 2 diabetes mellitus without complications: Secondary | ICD-10-CM | POA: Diagnosis not present

## 2020-04-25 DIAGNOSIS — Z8249 Family history of ischemic heart disease and other diseases of the circulatory system: Secondary | ICD-10-CM | POA: Diagnosis not present

## 2020-04-25 DIAGNOSIS — I10 Essential (primary) hypertension: Secondary | ICD-10-CM | POA: Diagnosis not present

## 2020-04-25 DIAGNOSIS — R42 Dizziness and giddiness: Secondary | ICD-10-CM

## 2020-04-25 DIAGNOSIS — Z6827 Body mass index (BMI) 27.0-27.9, adult: Secondary | ICD-10-CM | POA: Diagnosis not present

## 2020-04-25 MED ORDER — IBUPROFEN 800 MG PO TABS: 800.0000 mg | ORAL_TABLET | Freq: Three times a day (TID) | ORAL | 0 refills | Status: DC | PRN

## 2020-04-25 MED ORDER — OXYCODONE HCL 5 MG PO TABS: 5.0000 mg | ORAL_TABLET | ORAL | 0 refills | Status: DC | PRN

## 2020-04-25 MED ORDER — SENNOSIDES-DOCUSATE SODIUM 8.6-50 MG PO TABS: 2.0000 | ORAL_TABLET | Freq: Every day | ORAL | 0 refills | Status: DC

## 2020-04-25 NOTE — Progress Notes (Signed)
   Covid-19 Vaccination Clinic  Name:  Christy Waller    MRN: [id number] DOB: 02/17/1971  04/25/2020  [name] was observed post Covid-19 immunization for 15 minutes without incident. She was provided with Vaccine Information Sheet and instruction to access the V-Safe system.   [name] was instructed to call 911 with any severe reactions post vaccine: [name] Difficulty breathing  . Swelling of face and throat  . A fast heartbeat  . A bad rash all over body  . Dizziness and weakness

## 2020-04-25 NOTE — Patient Instructions (Signed)
We will need to obtain medical clearance from your primary care doctor prior to surgery saying it is safe for you to proceed with surgery.  Preparing for your Surgery  Plan for surgery on May 01, 2020 with Dr. Jeral Pinch at [facility] will be scheduled for a robotic assisted total laparoscopic hysterectomy (removal of uterus and cervix), bilateral salpingo-oophorectomy (removal of both fallopian tubes and ovaries), sentinel lymph node biopsy, possible lymph node dissection, possible laparotomy (larger incision on your abdomen if needed).   Pre-operative Testing -You will receive a phone call from presurgical testing at [facility] to arrange for a pre-operative appointment over the phone, lab appointment, and COVID test. The COVID test normally happens 3 days prior to the surgery and they ask that you self quarantine after the test up until surgery to decrease chance of exposure.  -Bring your insurance card, copy of an advanced directive if applicable, medication list  -At that visit, you will be asked to sign a consent for a possible blood transfusion in case a transfusion becomes necessary during surgery.  The need for a blood transfusion is rare but having consent is a necessary part of your care.     -You should not be taking blood thinners or aspirin at least ten days prior to surgery unless instructed by your surgeon.  -Do not take supplements such as fish oil (omega 3), red yeast rice, turmeric before your surgery.   Day Before Surgery at [place] will be asked to take in a light diet the day before surgery. You will be advised you can have clear liquids up until 3 hours before your surgery.    Eat a light diet the day before surgery.  Examples including soups, broths, toast, yogurt, mashed potatoes.  AVOID GAS PRODUCING FOODS. Things to avoid include carbonated beverages (fizzy beverages), raw fruits and raw vegetables, or beans.   If your bowels are  filled with gas, your surgeon will have difficulty visualizing your pelvic organs which increases your surgical risks.  Your role in recovery Your role is to become active as soon as directed by your doctor, while still giving yourself time to heal.  Rest when you feel tired. You will be asked to do the following in order to speed your recovery:  - Cough and breathe deeply. This helps to clear and expand your lungs and can prevent pneumonia after surgery.  - [place]. Do mild physical activity. Walking or moving your legs help your circulation and body functions return to normal. Do not try to get up or walk alone the first time after surgery.   -If you develop swelling on one leg or the other, pain in the back of your leg, redness/warmth in one of your legs, please call the office or go to the Emergency Room to have a doppler to rule out a blood clot. For shortness of breath, chest pain-seek care in the Emergency Room as soon as possible. - Actively manage your pain. Managing your pain lets you move in comfort. We will ask you to rate your pain on a scale of zero to 10. It is your responsibility to tell your doctor or nurse where and how much you hurt so your pain can be treated.  Special Considerations -If you are diabetic, you may be placed on insulin after surgery to have closer control over your blood sugars to promote healing and recovery.  This does not mean that you  will be discharged on insulin.  If applicable, your oral antidiabetics will be resumed when you are tolerating a solid diet.  -Your final pathology results from surgery should be available around one week after surgery and the results will be relayed to you when available.  -Dr. Lahoma Crocker is the surgeon that assists your GYN Oncologist with surgery.  If you end up staying the night, the next day after your surgery you will either see Dr. Denman George, Dr. Berline Lopes, or Dr. Lahoma Crocker.  -FMLA forms can  be faxed to [phone] and please allow 5-7 business days for completion.  Pain Management After Surgery -You have been prescribed your pain medication and bowel regimen medications before surgery so that you can have these available when you are discharged from the hospital. The pain medication is for use ONLY AFTER surgery and a new prescription will not be given.   -Make sure that you have Tylenol and Ibuprofen at home to use on a regular basis after surgery for pain control. We recommend alternating the medications every hour to six hours since they work differently and are processed in the body differently for pain relief.  -Review the attached handout on narcotic use and their risks and side effects.   Bowel Regimen -You have been prescribed Sennakot-S to take nightly to prevent constipation especially if you are taking the narcotic pain medication intermittently.  It is important to prevent constipation and drink adequate amounts of liquids. You can stop taking this medication when you are not taking pain medication and you are back on your normal bowel routine.  Risks of Surgery Risks of surgery are low but include bleeding, infection, damage to surrounding structures, re-operation, blood clots, and very rarely death.   Blood Transfusion Information (For the consent to be signed before surgery)  We will be checking your blood type before surgery so in case of emergencies, we will know what type of blood you would need.                                            WHAT IS A BLOOD TRANSFUSION?  A transfusion is the replacement of blood or some of its parts. Blood is made up of multiple cells which provide different functions.  Red blood cells carry oxygen and are used for blood loss replacement.  White blood cells fight against infection.  Platelets control bleeding.  Plasma helps clot blood.  Other blood products are available for specialized needs, such as hemophilia or other  clotting disorders. BEFORE THE TRANSFUSION  Who gives blood for transfusions?   You may be able to donate blood to be used at a later date on yourself (autologous donation).  Relatives can be asked to donate blood. This is generally not any safer than if you have received blood from a stranger. The same precautions are taken to ensure safety when a relative's blood is donated.  Healthy volunteers who are fully evaluated to make sure their blood is safe. This is blood bank blood. Transfusion therapy is the safest it has ever been in the practice of medicine. Before blood is taken from a donor, a complete history is taken to make sure that person has no history of diseases nor engages in risky social behavior (examples are intravenous drug use or sexual activity with multiple partners). The donor's travel history is screened to minimize risk  of transmitting infections, such as malaria. The donated blood is tested for signs of infectious diseases, such as HIV and hepatitis. The blood is then tested to be sure it is compatible with you in order to minimize the chance of a transfusion reaction. If you or a relative donates blood, this is often done in anticipation of surgery and is not appropriate for emergency situations. It takes many days to process the donated blood. RISKS AND COMPLICATIONS Although transfusion therapy is very safe and saves many lives, the main dangers of transfusion include:   Getting an infectious disease.  Developing a transfusion reaction. This is an allergic reaction to something in the blood you were given. Every precaution is taken to prevent this. The decision to have a blood transfusion has been considered carefully by your caregiver before blood is given. Blood is not given unless the benefits outweigh the risks.  AFTER SURGERY INSTRUCTIONS  Return to work: 4-6 weeks if applicable  Activity: 1. Be up and out of the bed during the day.  Take a nap if needed.  You may  walk up steps but be careful and use the hand rail.  Stair climbing will tire you more than you think, you may need to stop part way and rest.   2. No lifting or straining for 6 weeks over 10 pounds. No pushing, pulling, straining for 6 weeks.  3. No driving for 1 week(s).  Do not drive if you are taking narcotic pain medicine and make sure that your reaction time has returned.   4. You can shower as soon as the next day after surgery. Shower daily.  Use your regular soap and water (not directly on the incision) and pat your incision(s) dry afterwards; don't rub.  No tub baths or submerging your body in water until cleared by your surgeon. If you have the soap that was given to you by pre-surgical testing that was used before surgery, you do not need to use it afterwards because this can irritate your incisions.   5. No sexual activity and nothing in the vagina for 8 weeks.  6. You may experience a small amount of clear drainage from your incisions, which is normal.  If the drainage persists, increases, or changes color please call the office.  7. Do not use creams, lotions, or ointments such as neosporin on your incisions after surgery until advised by your surgeon because they can cause removal of the dermabond glue on your incisions.    8. You may experience vaginal spotting after surgery or around the 6-8 week mark from surgery when the stitches at the top of the vagina begin to dissolve.  The spotting is normal but if you experience heavy bleeding, call our office.  9. Take Tylenol or ibuprofen first for pain and only use narcotic pain medication for severe pain not relieved by the Tylenol or Ibuprofen.  Monitor your Tylenol intake to a max of 4,000 mg in a 24 hour period. You can alternate these medications after surgery.  Diet: 1. Low sodium Heart Healthy Diet is recommended but you are cleared to resume your normal (before surgery) diet after your procedure.  2. It is safe to use a  laxative, such as Miralax or Colace, if you have difficulty moving your bowels. You have been prescribed Sennakot at bedtime every evening to keep bowel movements regular and to prevent constipation.    Wound Care: 1. Keep clean and dry.  Shower daily.  Reasons to call  the Doctor:  Fever - Oral temperature greater than 100.4 degrees Fahrenheit  Foul-smelling vaginal discharge  Difficulty urinating  Nausea and vomiting  Increased pain at the site of the incision that is unrelieved with pain medicine.  Difficulty breathing with or without chest pain  New calf pain especially if only on one side  Sudden, continuing increased vaginal bleeding with or without clots.   Contacts: For questions or concerns you should contact:  Dr. Jeral Pinch at [phone]  Joylene John, NP at [phone]  After Hours: call [phone] and have the GYN Oncologist paged/contacted

## 2020-04-28 ENCOUNTER — Encounter (HOSPITAL_COMMUNITY): Payer: Self-pay

## 2020-04-28 ENCOUNTER — Other Ambulatory Visit (HOSPITAL_COMMUNITY): Payer: 59

## 2020-04-28 ENCOUNTER — Other Ambulatory Visit: Payer: Self-pay

## 2020-04-28 ENCOUNTER — Telehealth: Payer: Self-pay | Admitting: *Deleted

## 2020-04-28 ENCOUNTER — Encounter (HOSPITAL_COMMUNITY)
Admission: RE | Admit: 2020-04-28 | Discharge: 2020-04-28 | Disposition: A | Discharge status: Home / Self Care | Payer: 59 | Source: Ambulatory Visit | Attending: Gynecologic Oncology | Admitting: Gynecologic Oncology

## 2020-04-28 ENCOUNTER — Other Ambulatory Visit (HOSPITAL_COMMUNITY)
Admission: RE | Admit: 2020-04-28 | Discharge: 2020-04-28 | Disposition: A | Discharge status: Home / Self Care | Payer: 59 | Source: Ambulatory Visit | Attending: Gynecologic Oncology | Admitting: Gynecologic Oncology

## 2020-04-28 DIAGNOSIS — I10 Essential (primary) hypertension: Secondary | ICD-10-CM | POA: Diagnosis not present

## 2020-04-28 DIAGNOSIS — Z01812 Encounter for preprocedural laboratory examination: Secondary | ICD-10-CM | POA: Insufficient documentation

## 2020-04-28 DIAGNOSIS — Z79899 Other long term (current) drug therapy: Secondary | ICD-10-CM | POA: Diagnosis not present

## 2020-04-28 DIAGNOSIS — Z87891 Personal history of nicotine dependence: Secondary | ICD-10-CM | POA: Insufficient documentation

## 2020-04-28 DIAGNOSIS — C541 Malignant neoplasm of endometrium: Secondary | ICD-10-CM | POA: Insufficient documentation

## 2020-04-28 DIAGNOSIS — Z20822 Contact with and (suspected) exposure to covid-19: Secondary | ICD-10-CM | POA: Insufficient documentation

## 2020-04-28 DIAGNOSIS — Z7901 Long term (current) use of anticoagulants: Secondary | ICD-10-CM | POA: Diagnosis not present

## 2020-04-28 LAB — COMPREHENSIVE METABOLIC PANEL
ALT: 13 U/L (ref 0–44)
AST: 20 U/L (ref 15–41)
Albumin: 4.3 g/dL (ref 3.5–5.0)
Alkaline Phosphatase: 81 U/L (ref 38–126)
Anion gap: 11 (ref 5–15)
BUN: 13 mg/dL (ref 6–20)
CO2: 26 mmol/L (ref 22–32)
Calcium: 10.2 mg/dL (ref 8.9–10.3)
Chloride: 104 mmol/L (ref 98–111)
Creatinine, Ser: 0.64 mg/dL (ref 0.44–1.00)
GFR calc Af Amer: >60 mL/min (ref >60)
GFR calc non Af Amer: >60 mL/min (ref >60)
Glucose, Bld: 93 mg/dL (ref 70–99)
Potassium: 3.9 mmol/L (ref 3.5–5.1)
Sodium: 141 mmol/L (ref 135–145)
Total Bilirubin: 0.4 mg/dL (ref 0.3–1.2)
Total Protein: 7.8 g/dL (ref 6.5–8.1)

## 2020-04-28 LAB — URINALYSIS, ROUTINE W REFLEX MICROSCOPIC
Bilirubin Urine: NEGATIVE
Glucose, UA: NEGATIVE mg/dL
Ketones, ur: 20 mg/dL — AB
Nitrite: NEGATIVE
Protein, ur: 30 mg/dL — AB
RBC / HPF: >50 RBC/hpf — ABNORMAL HIGH (ref 0–5)
Specific Gravity, Urine: 1.012 (ref 1.005–1.030)
pH: 6 (ref 5.0–8.0)

## 2020-04-28 LAB — HEMOGLOBIN A1C
Hgb A1c MFr Bld: 5.1 % (ref 4.8–5.6)
Mean Plasma Glucose: 99.67 mg/dL

## 2020-04-28 LAB — CBC
HCT: 39.7 % (ref 36.0–46.0)
Hemoglobin: 13.2 g/dL (ref 12.0–15.0)
MCH: 28.1 pg (ref 26.0–34.0)
MCHC: 33.2 g/dL (ref 30.0–36.0)
MCV: 84.6 fL (ref 80.0–100.0)
Platelets: 288 10*3/uL (ref 150–400)
RBC: 4.69 MIL/uL (ref 3.87–5.11)
RDW: 14.9 % (ref 11.5–15.5)
WBC: 9.8 10*3/uL (ref 4.0–10.5)
nRBC: 0 % (ref 0.0–0.2)

## 2020-04-28 LAB — SARS CORONAVIRUS 2 (TAT 6-24 HRS): SARS Coronavirus 2: NEGATIVE

## 2020-04-28 NOTE — Progress Notes (Signed)
Temp was 99.5 at preop visit today on 04/28/20.

## 2020-04-28 NOTE — Progress Notes (Addendum)
Anesthesia Review:  PCP: [facility] - in note pt will need another appt to be cleared for surgery by PCP  OB/GYN ONC stated they have LVMM for pt in regards to this for clearance  Cardiologist : Chest x-ray : EKG : 04/25/20  Echo : Stress test: Cardiac Cath :  Activity level: can do a flight of stairs without difficulty  Sleep Study/ CPAP :no  Fasting Blood Sugar :      / Checks Blood Sugar -- times a day:   Blood Thinner/ Instructions /Last Dose: ASA / Instructions/ Last Dose :  U/A done 04/28/20 routed via epic to Dr Berline Lopes and Zoila Shutter. Per Lenna Sciara they will be doing a culture  Patient ahd covid 19 booster on 04/25/20 per pt .  Temp 99.5 on 04/28/20 at preop appt.  Patient denies any covid symptoms. Routed temp results via epic to DR Jeral Pinch and Zoila Shutter.  Roanna Banning made aware of temp 99.5 at preop appt .  Patient denies any covid symptoms.  Patient had booster of Covid vaccine on 04/25/20 per pt.

## 2020-04-28 NOTE — Telephone Encounter (Signed)
Called [facility] at [facility] to check on the medical clearance form. Per the nurse on the phone patient was last seen in that office on 7/27 and would need another appt scheduled before they can fill out the form. Called and left the patient a message explaining the above information

## 2020-04-28 NOTE — Progress Notes (Signed)
DUE TO COVID-19 ONLY ONE VISITOR IS ALLOWED TO COME WITH YOU AND STAY IN THE WAITING ROOM ONLY DURING PRE OP AND PROCEDURE DAY OF SURGERY. THE 1 VISITOR  MAY VISIT WITH YOU AFTER SURGERY IN YOUR PRIVATE ROOM DURING VISITING HOURS ONLY!  YOU NEED TO HAVE A COVID 19 TEST ON__9/13/2021 _____ @_______ , THIS TEST MUST BE DONE BEFORE SURGERY,  COVID TESTING SITE [address] [place] [address], IT IS ON THE RIGHT GOING OUT [address] APPROXIMATELY  2 MINUTES PAST [employer] ON THE RIGHT. ONCE YOUR COVID TEST IS COMPLETED,  PLEASE BEGIN THE QUARANTINE INSTRUCTIONS AS OUTLINED IN YOUR HANDOUT.                Christy Waller  04/28/2020   Your procedure is scheduled on: 05/01/2020    Report to [facility] Main  Entrance   Report to admitting at    [place] AM     Call this number if you have problems the morning of surgery [phone]          Eat a light diet the day before surgery.  Avoid gas producing foods.  No sold foods after midnite.  May have clear liquids from 12 midnite until 0815am morning of surgery then nothing by mouth.    CLEAR LIQUID DIET   Foods Allowed                                                                      Coffee and tea, regular and decaf                           Plain Jell-O any favor except red or purple                                         Fruit ices (not with fruit pulp)                                      Iced Popsicles                                     Carbonated beverages, regular and diet                                    Cranberry, grape and apple juices Sports drinks like Gatorade Lightly seasoned clear broth or consume(fat free) Sugar, honey syrup  _____________________________________________________________________      BRUSH YOUR TEETH MORNING OF SURGERY AND RINSE YOUR MOUTH OUT, NO CHEWING GUM CANDY OR MINTS.     Take these medicines the morning of surgery with A SIP OF WATER:  Amlodipine                                  You may not have  any metal on your body including hair pins and              piercings  Do not wear jewelry, make-up, lotions, powders or perfumes, deodorant             Do not wear nail polish on your fingernails.  Do not shave  48 hours prior to surgery.              Men may shave face and neck.   Do not bring valuables to the hospital. [place].  Contacts, dentures or bridgework may not be worn into surgery.  Leave suitcase in the car. After surgery it may be brought to your room.                 Please read over the following fact sheets you were given: _____________________________________________________________________  [facility] - Preparing for Surgery Before surgery, you can play an important role.  Because skin is not sterile, your skin needs to be as free of germs as possible.  You can reduce the number of germs on your skin by washing with CHG (chlorahexidine gluconate) soap before surgery.  CHG is an antiseptic cleaner which kills germs and bonds with the skin to continue killing germs even after washing. Please DO NOT use if you have an allergy to CHG or antibacterial soaps.  If your skin becomes reddened/irritated stop using the CHG and inform your nurse when you arrive at Short Stay. Do not shave (including legs and underarms) for at least 48 hours prior to the first CHG shower.  You may shave your face/neck. Please follow these instructions carefully:  1.  Shower with CHG Soap the night before surgery and the  morning of Surgery.  2.  If you choose to wash your hair, wash your hair first as usual with your  normal  shampoo.  3.  After you shampoo, rinse your hair and body thoroughly to remove the  shampoo.                           4.  Use CHG as you would any other liquid soap.  You can apply chg directly  to the skin and wash                       Gently with a scrungie or clean washcloth.  5.   Apply the CHG Soap to your body ONLY FROM THE NECK DOWN.   Do not use on face/ open                           Wound or open sores. Avoid contact with eyes, ears mouth and genitals (private parts).                       Wash face,  Genitals (private parts) with your normal soap.             6.  Wash thoroughly, paying special attention to the area where your surgery  will be performed.  7.  Thoroughly rinse your body with warm water from the neck down.  8.  DO NOT shower/wash with your normal soap after using and rinsing off  the CHG Soap.  9.  Pat yourself dry with a clean towel.            10.  Wear clean pajamas.            11.  Place clean sheets on your bed the night of your first shower and do not  sleep with pets. Day of Surgery : Do not apply any lotions/deodorants the morning of surgery.  Please wear clean clothes to the hospital/surgery center.  FAILURE TO FOLLOW THESE INSTRUCTIONS MAY RESULT IN THE CANCELLATION OF YOUR SURGERY PATIENT SIGNATURE_________________________________  NURSE SIGNATURE__________________________________  ________________________________________________________________________

## 2020-04-28 NOTE — Progress Notes (Signed)
U/A done 04/28/20 faxed via epic to Dr Berline Lopes and Zoila Shutter.

## 2020-04-30 ENCOUNTER — Telehealth: Payer: Self-pay

## 2020-04-30 LAB — URINE CULTURE

## 2020-04-30 NOTE — Progress Notes (Signed)
Anesthesia Chart Review   Case: [id number] Date/Time: 05/01/20 1100   Procedures:      XI ROBOTIC ASSISTED TOTAL HYSTERECTOMY WITH BILATERAL SALPINGO OOPHORECTOMY (N/A )     SENTINEL NODE BIOPSY (N/A )     POSSIBLE LYMPH NODE DISSECTION (N/A )     POSSIBLE LAPAROTOMY (N/A )   Anesthesia type: General   Pre-op diagnosis: ENDOMETRIAL CANCER   Location: [facility] ROOM 02 / [facility]   Surgeons: [name], MD      DISCUSSION:49 y.o. former smoker (5 pack years, quit 09/29/16) with h/o HTN, endometrial cancer scheduled for above procedure 05/01/2020 with Dr. Jeral Pinch.   Chest pain evaluated by cardiology in the past in 2013.  Cardiac markers were negative. 2-D echocardiogram 03/22/12: EF 50%, mild LAE, no wall motion abnormalities. Myoview 03/23/12: EF 50%, no ischemia or scar. Chest CT angiogram 03/22/12: Negative for pulmonary embolism. Patient was noted to have sinus bradycardia with heart rates down into the 40s. A 21 day event monitor demonstrated sinus rhythm, sinus bradycardia, sinus tachycardia and occasional PVCs. No significant bradyarrhythmias noted. Of note, the patient was able to get her heart rate up to 152 on the treadmill for her Myoview ruling out chronotropic incompetence.  Last seen by cardiology 11/15/2016.  Stable at this visit.  PRN follow up recommended.  Reports to PAT nurse she can climb a flight of stairs without difficulty.  EKG unchanged.    Anticipate pt can proceed with planned procedure barring acute status change.   VS: BP (!) 156/93   Pulse (!) 58   Temp 37.5 C (Oral)   Ht 5\' 2"  (1.575 m)   Wt 65.3 kg   SpO2 100%   BMI 26.34 kg/m   PROVIDERS: [employer], [facility] [place]   LABS: UA forwarded to surgeon  (all labs ordered are listed, but only abnormal results are displayed)  Labs Reviewed  URINALYSIS, ROUTINE W REFLEX MICROSCOPIC - Abnormal; Notable for the following components:      Result Value   Hgb urine dipstick LARGE (*)     Ketones, ur 20 (*)    Protein, ur 30 (*)    Leukocytes,Ua LARGE (*)    RBC / HPF >50 (*)    Bacteria, UA RARE (*)    All other components within normal limits  CBC  COMPREHENSIVE METABOLIC PANEL  HEMOGLOBIN A1C  TYPE AND SCREEN     IMAGES:   EKG: 04/25/2020 Rate 54 bpm  Sinus bradycardia  Left anterior fascicular block  No significant change since last tracing   CV: Echo 03/22/2012 Study Conclusions   - Left ventricle: Posterior basal hypokinesis The cavity  size was normal. Wall thickness was normal. Systolic  function was normal. The estimated ejection fraction was  50%. Wall motion was normal; there were no regional wall  motion abnormalities.  - Left atrium: The atrium was mildly dilated.  - Atrial septum: No defect or patent foramen ovale was  identified.   03/23/2012 IMPRESSION:  No evidence of ischemia or infarction.   Ejection fraction 50%.  Past Medical History:  Diagnosis Date  . Anemia   . Bradycardia    Event monitor 8/13: NSR, sinus bradycardia, sinus tachycardia, no significant bradycardia arrhythmias  . Chest pain 03/22/2012   Normal Myoview, normal echo, chest CT negative for pulmonary embolism  . History of nuclear stress test 03/2012   Myoview 03/23/12: EF 50%, no ischemia or scar.  [name] HTN (hypertension)    2-D echocardiogram  03/22/12: EF 50%, mild LAE, no wall motion abnormalities.  . Tubal pregnancy    In her late 20's  . Vitamin D deficiency     Past Surgical History:  Procedure Laterality Date  . CESAREAN SECTION     x2  . ECTOPIC PREGNANCY SURGERY    . TUBAL LIGATION      MEDICATIONS: . amLODipine (NORVASC) 10 MG tablet  . ibuprofen (ADVIL) 800 MG tablet  . oxyCODONE (OXY IR/ROXICODONE) 5 MG immediate release tablet  . senna-docusate (SENOKOT-S) 8.6-50 MG tablet   No current facility-administered medications for this encounter.     Konrad Felix, PA-C [facility] Pre-Surgical Testing [phone]

## 2020-04-30 NOTE — Telephone Encounter (Signed)
Christy Waller stated that she understands her written pre-op instructions. No questions or concerns at this time.  Christy Waller stated that she went to her PCP at [facility] yesterday at 51 and filled out paper work and was told that was all she needed to do for the medical clearance. Told her that anesthesia is fine with her medical record and medical clearance is not needed per Joylene John, NP.

## 2020-05-01 ENCOUNTER — Ambulatory Visit (HOSPITAL_COMMUNITY): Payer: 59 | Admitting: Physician Assistant

## 2020-05-01 ENCOUNTER — Encounter (HOSPITAL_COMMUNITY)
Admission: RE | Disposition: A | Discharge status: Home / Self Care | Payer: Self-pay | Source: Home / Self Care | Attending: Gynecologic Oncology

## 2020-05-01 ENCOUNTER — Ambulatory Visit (HOSPITAL_COMMUNITY)
Admission: RE | Admit: 2020-05-01 | Discharge: 2020-05-01 | Disposition: A | Discharge status: Home / Self Care | Payer: 59 | Attending: Gynecologic Oncology | Admitting: Gynecologic Oncology

## 2020-05-01 ENCOUNTER — Ambulatory Visit (HOSPITAL_COMMUNITY): Payer: 59 | Admitting: Certified Registered"

## 2020-05-01 ENCOUNTER — Encounter (HOSPITAL_COMMUNITY): Payer: Self-pay | Admitting: Gynecologic Oncology

## 2020-05-01 ENCOUNTER — Other Ambulatory Visit: Payer: Self-pay

## 2020-05-01 ENCOUNTER — Telehealth (HOSPITAL_COMMUNITY): Payer: Self-pay | Admitting: *Deleted

## 2020-05-01 DIAGNOSIS — Z87891 Personal history of nicotine dependence: Secondary | ICD-10-CM | POA: Diagnosis not present

## 2020-05-01 DIAGNOSIS — E119 Type 2 diabetes mellitus without complications: Secondary | ICD-10-CM | POA: Diagnosis not present

## 2020-05-01 DIAGNOSIS — C541 Malignant neoplasm of endometrium: Secondary | ICD-10-CM | POA: Diagnosis not present

## 2020-05-01 DIAGNOSIS — Z79899 Other long term (current) drug therapy: Secondary | ICD-10-CM | POA: Diagnosis not present

## 2020-05-01 DIAGNOSIS — Z888 Allergy status to other drugs, medicaments and biological substances status: Secondary | ICD-10-CM | POA: Diagnosis not present

## 2020-05-01 DIAGNOSIS — I10 Essential (primary) hypertension: Secondary | ICD-10-CM | POA: Insufficient documentation

## 2020-05-01 HISTORY — PX: ROBOTIC ASSISTED TOTAL HYSTERECTOMY WITH BILATERAL SALPINGO OOPHERECTOMY: SHX6086

## 2020-05-01 HISTORY — PX: SENTINEL NODE BIOPSY: SHX6608

## 2020-05-01 LAB — PREGNANCY, URINE: Preg Test, Ur: NEGATIVE

## 2020-05-01 LAB — TYPE AND SCREEN
ABO/RH(D): A POS
Antibody Screen: NEGATIVE

## 2020-05-01 LAB — ABO/RH: ABO/RH(D): A POS

## 2020-05-01 SURGERY — HYSTERECTOMY, TOTAL, ROBOT-ASSISTED, LAPAROSCOPIC, WITH BILATERAL SALPINGO-OOPHORECTOMY
Anesthesia: General

## 2020-05-01 MED ORDER — ROCURONIUM BROMIDE 10 MG/ML (PF) SYRINGE
PREFILLED_SYRINGE | INTRAVENOUS | Status: DC | PRN
  Administered 2020-05-01: 50 mg via INTRAVENOUS
  Administered 2020-05-01: 20 mg via INTRAVENOUS
  Administered 2020-05-01: 10 mg via INTRAVENOUS

## 2020-05-01 MED ORDER — SUGAMMADEX SODIUM 200 MG/2ML IV SOLN
INTRAVENOUS | Status: DC | PRN
  Administered 2020-05-01: 150 mg via INTRAVENOUS

## 2020-05-01 MED ORDER — HEPARIN SODIUM (PORCINE) 5000 UNIT/ML IJ SOLN
5000.0000 [IU] | INTRAMUSCULAR | Status: AC
  Administered 2020-05-01: 5000 [IU] via SUBCUTANEOUS
  Filled 2020-05-01: qty 1

## 2020-05-01 MED ORDER — FENTANYL CITRATE (PF) 100 MCG/2ML IJ SOLN
INTRAMUSCULAR | Status: AC
  Filled 2020-05-01: qty 2

## 2020-05-01 MED ORDER — OXYCODONE HCL 5 MG PO TABS: 5.0000 mg | ORAL_TABLET | ORAL | Status: DC | PRN

## 2020-05-01 MED ORDER — GABAPENTIN 300 MG PO CAPS
300.0000 mg | ORAL_CAPSULE | ORAL | Status: AC
  Administered 2020-05-01: 300 mg via ORAL
  Filled 2020-05-01: qty 1

## 2020-05-01 MED ORDER — STERILE WATER FOR INJECTION IJ SOLN
INTRAMUSCULAR | Status: DC | PRN
  Administered 2020-05-01: 5 mL

## 2020-05-01 MED ORDER — DEXAMETHASONE SODIUM PHOSPHATE 10 MG/ML IJ SOLN
INTRAMUSCULAR | Status: DC | PRN
  Administered 2020-05-01: 4 mg via INTRAVENOUS

## 2020-05-01 MED ORDER — LIDOCAINE 2% (20 MG/ML) 5 ML SYRINGE
INTRAMUSCULAR | Status: AC
  Filled 2020-05-01: qty 5

## 2020-05-01 MED ORDER — ONDANSETRON HCL 4 MG/2ML IJ SOLN
INTRAMUSCULAR | Status: DC | PRN
  Administered 2020-05-01: 4 mg via INTRAVENOUS

## 2020-05-01 MED ORDER — LIDOCAINE 2% (20 MG/ML) 5 ML SYRINGE
INTRAMUSCULAR | Status: DC | PRN
  Administered 2020-05-01: 40 mg via INTRAVENOUS

## 2020-05-01 MED ORDER — MORPHINE SULFATE (PF) 4 MG/ML IV SOLN: 2.0000 mg | INTRAVENOUS | Status: DC | PRN

## 2020-05-01 MED ORDER — STERILE WATER FOR IRRIGATION IR SOLN
Status: DC | PRN
  Administered 2020-05-01: 1000 mL

## 2020-05-01 MED ORDER — PROPOFOL 10 MG/ML IV BOLUS
INTRAVENOUS | Status: AC
  Filled 2020-05-01: qty 20

## 2020-05-01 MED ORDER — SODIUM CHLORIDE 0.9% FLUSH: 3.0000 mL | INTRAVENOUS | Status: DC | PRN

## 2020-05-01 MED ORDER — PHENYLEPHRINE HCL (PRESSORS) 10 MG/ML IV SOLN
INTRAVENOUS | Status: AC
  Filled 2020-05-01: qty 1

## 2020-05-01 MED ORDER — CEFAZOLIN SODIUM-DEXTROSE 2-4 GM/100ML-% IV SOLN
2.0000 g | INTRAVENOUS | Status: AC
  Administered 2020-05-01: 2 g via INTRAVENOUS
  Filled 2020-05-01: qty 100

## 2020-05-01 MED ORDER — LIDOCAINE 20MG/ML (2%) 15 ML SYRINGE OPTIME
INTRAMUSCULAR | Status: DC | PRN
  Administered 2020-05-01: 1.5 mg/kg/h via INTRAVENOUS

## 2020-05-01 MED ORDER — LACTATED RINGERS IV SOLN: INTRAVENOUS | Status: DC

## 2020-05-01 MED ORDER — MIDAZOLAM HCL 2 MG/2ML IJ SOLN
INTRAMUSCULAR | Status: AC
  Filled 2020-05-01: qty 2

## 2020-05-01 MED ORDER — ACETAMINOPHEN 650 MG RE SUPP
650.0000 mg | RECTAL | Status: DC | PRN
  Filled 2020-05-01: qty 1

## 2020-05-01 MED ORDER — ACETAMINOPHEN 500 MG PO TABS
1000.0000 mg | ORAL_TABLET | ORAL | Status: AC
  Administered 2020-05-01: 1000 mg via ORAL
  Filled 2020-05-01: qty 2

## 2020-05-01 MED ORDER — KETOROLAC TROMETHAMINE 15 MG/ML IJ SOLN: 15.0000 mg | Freq: Four times a day (QID) | INTRAMUSCULAR | Status: DC

## 2020-05-01 MED ORDER — CELECOXIB 200 MG PO CAPS
400.0000 mg | ORAL_CAPSULE | ORAL | Status: AC
  Administered 2020-05-01: 400 mg via ORAL
  Filled 2020-05-01: qty 2

## 2020-05-01 MED ORDER — DEXAMETHASONE SODIUM PHOSPHATE 10 MG/ML IJ SOLN
INTRAMUSCULAR | Status: AC
  Filled 2020-05-01: qty 1

## 2020-05-01 MED ORDER — PROMETHAZINE HCL 25 MG/ML IJ SOLN: 6.2500 mg | INTRAMUSCULAR | Status: DC | PRN

## 2020-05-01 MED ORDER — SODIUM CHLORIDE 0.9% FLUSH: 3.0000 mL | Freq: Two times a day (BID) | INTRAVENOUS | Status: DC

## 2020-05-01 MED ORDER — ACETAMINOPHEN 325 MG PO TABS: 650.0000 mg | ORAL_TABLET | ORAL | Status: DC | PRN

## 2020-05-01 MED ORDER — HYDROMORPHONE HCL 1 MG/ML IJ SOLN
INTRAMUSCULAR | Status: AC
  Administered 2020-05-01: 0.5 mg via INTRAVENOUS
  Filled 2020-05-01: qty 1

## 2020-05-01 MED ORDER — STERILE WATER FOR INJECTION IJ SOLN
INTRAMUSCULAR | Status: DC | PRN
  Administered 2020-05-01: 1 mL via INTRAMUSCULAR

## 2020-05-01 MED ORDER — BUPIVACAINE HCL 0.25 % IJ SOLN
INTRAMUSCULAR | Status: AC
  Filled 2020-05-01: qty 1

## 2020-05-01 MED ORDER — PROPOFOL 10 MG/ML IV BOLUS
INTRAVENOUS | Status: DC | PRN
  Administered 2020-05-01: 50 mg via INTRAVENOUS
  Administered 2020-05-01: 150 mg via INTRAVENOUS

## 2020-05-01 MED ORDER — ORAL CARE MOUTH RINSE: 15.0000 mL | Freq: Once | OROMUCOSAL | Status: AC

## 2020-05-01 MED ORDER — EPHEDRINE SULFATE-NACL 50-0.9 MG/10ML-% IV SOSY
PREFILLED_SYRINGE | INTRAVENOUS | Status: DC | PRN
  Administered 2020-05-01: 5 mg via INTRAVENOUS

## 2020-05-01 MED ORDER — BUPIVACAINE HCL 0.25 % IJ SOLN
INTRAMUSCULAR | Status: DC | PRN
  Administered 2020-05-01: 28 mL
  Administered 2020-05-01: 22 mL

## 2020-05-01 MED ORDER — CHLORHEXIDINE GLUCONATE 0.12 % MT SOLN
15.0000 mL | Freq: Once | OROMUCOSAL | Status: AC
  Administered 2020-05-01: 15 mL via OROMUCOSAL

## 2020-05-01 MED ORDER — DEXAMETHASONE SODIUM PHOSPHATE 4 MG/ML IJ SOLN: 4.0000 mg | INTRAMUSCULAR | Status: DC

## 2020-05-01 MED ORDER — SCOPOLAMINE 1 MG/3DAYS TD PT72
1.0000 | MEDICATED_PATCH | TRANSDERMAL | Status: DC
  Administered 2020-05-01: 1.5 mg via TRANSDERMAL
  Filled 2020-05-01: qty 1

## 2020-05-01 MED ORDER — ROCURONIUM BROMIDE 10 MG/ML (PF) SYRINGE
PREFILLED_SYRINGE | INTRAVENOUS | Status: AC
  Filled 2020-05-01: qty 10

## 2020-05-01 MED ORDER — MIDAZOLAM HCL 2 MG/2ML IJ SOLN
INTRAMUSCULAR | Status: DC | PRN
  Administered 2020-05-01: 2 mg via INTRAVENOUS

## 2020-05-01 MED ORDER — FENTANYL CITRATE (PF) 250 MCG/5ML IJ SOLN
INTRAMUSCULAR | Status: DC | PRN
Start: 2020-05-01 — End: 2020-05-01
  Administered 2020-05-01 (×2): 50 ug via INTRAVENOUS
  Administered 2020-05-01: 100 ug via INTRAVENOUS

## 2020-05-01 MED ORDER — LACTATED RINGERS IR SOLN
Status: DC | PRN
  Administered 2020-05-01: 1000 mL

## 2020-05-01 MED ORDER — SODIUM CHLORIDE 0.9 % IV SOLN: 250.0000 mL | INTRAVENOUS | Status: DC | PRN

## 2020-05-01 MED ORDER — ONDANSETRON HCL 4 MG/2ML IJ SOLN
INTRAMUSCULAR | Status: AC
  Filled 2020-05-01: qty 2

## 2020-05-01 MED ORDER — HYDROMORPHONE HCL 1 MG/ML IJ SOLN
0.2500 mg | INTRAMUSCULAR | Status: DC | PRN
  Administered 2020-05-01: 0.5 mg via INTRAVENOUS

## 2020-05-01 MED ORDER — EPHEDRINE 5 MG/ML INJ
INTRAVENOUS | Status: AC
  Filled 2020-05-01: qty 10

## 2020-05-01 SURGICAL SUPPLY — 70 items
ADH SKN CLS APL DERMABOND .7 (GAUZE/BANDAGES/DRESSINGS) ×1
AGENT HMST KT MTR STRL THRMB (HEMOSTASIS)
APL ESCP 34 STRL LF DISP (HEMOSTASIS)
APPLICATOR SURGIFLO ENDO (HEMOSTASIS) IMPLANT
BACTOSHIELD CHG 4% 4OZ (MISCELLANEOUS) ×1
BAG LAPAROSCOPIC 12 15 PORT 16 (BASKET) ×1 IMPLANT
BAG RETRIEVAL 12/15 (BASKET) ×2
BAG SPEC RTRVL LRG 6X4 10 (ENDOMECHANICALS)
BLADE SURG SZ10 CARB STEEL (BLADE) IMPLANT
COVER BACK TABLE 60X90IN (DRAPES) ×2 IMPLANT
COVER TIP SHEARS 8 DVNC (MISCELLANEOUS) ×1 IMPLANT
COVER TIP SHEARS 8MM DA VINCI (MISCELLANEOUS) ×2
COVER WAND RF STERILE (DRAPES) IMPLANT
DECANTER SPIKE VIAL GLASS SM (MISCELLANEOUS) IMPLANT
DERMABOND ADVANCED (GAUZE/BANDAGES/DRESSINGS) ×1
DERMABOND ADVANCED .7 DNX12 (GAUZE/BANDAGES/DRESSINGS) ×1 IMPLANT
DRAPE ARM DVNC X/XI (DISPOSABLE) ×4 IMPLANT
DRAPE COLUMN DVNC XI (DISPOSABLE) ×1 IMPLANT
DRAPE DA VINCI XI ARM (DISPOSABLE) ×8
DRAPE DA VINCI XI COLUMN (DISPOSABLE) ×2
DRAPE SHEET LG 3/4 BI-LAMINATE (DRAPES) ×2 IMPLANT
DRAPE SURG IRRIG POUCH 19X23 (DRAPES) ×2 IMPLANT
DRSG OPSITE POSTOP 4X6 (GAUZE/BANDAGES/DRESSINGS) IMPLANT
DRSG OPSITE POSTOP 4X8 (GAUZE/BANDAGES/DRESSINGS) IMPLANT
ELECT PENCIL ROCKER SW 15FT (MISCELLANEOUS) ×2 IMPLANT
ELECT REM PT RETURN 15FT ADLT (MISCELLANEOUS) ×2 IMPLANT
GLOVE BIO SURGEON STRL SZ 6 (GLOVE) ×8 IMPLANT
GLOVE BIO SURGEON STRL SZ 6.5 (GLOVE) ×4 IMPLANT
GOWN STRL REUS W/ TWL LRG LVL3 (GOWN DISPOSABLE) ×4 IMPLANT
GOWN STRL REUS W/TWL LRG LVL3 (GOWN DISPOSABLE) ×8
HOLDER FOLEY CATH W/STRAP (MISCELLANEOUS) ×2 IMPLANT
IRRIG SUCT STRYKERFLOW 2 WTIP (MISCELLANEOUS) ×2
IRRIGATION SUCT STRKRFLW 2 WTP (MISCELLANEOUS) ×1 IMPLANT
KIT PROCEDURE DA VINCI SI (MISCELLANEOUS)
KIT PROCEDURE DVNC SI (MISCELLANEOUS) IMPLANT
KIT TURNOVER KIT A (KITS) IMPLANT
MANIPULATOR UTERINE 4.5 ZUMI (MISCELLANEOUS) ×2 IMPLANT
NEEDLE HYPO 21X1.5 SAFETY (NEEDLE) ×2 IMPLANT
NEEDLE SPNL 18GX3.5 QUINCKE PK (NEEDLE) IMPLANT
OBTURATOR OPTICAL STANDARD 8MM (TROCAR) ×2
OBTURATOR OPTICAL STND 8 DVNC (TROCAR) ×1
OBTURATOR OPTICALSTD 8 DVNC (TROCAR) ×1 IMPLANT
PACK ROBOT GYN CUSTOM WL (TRAY / TRAY PROCEDURE) ×2 IMPLANT
PAD POSITIONING PINK XL (MISCELLANEOUS) ×2 IMPLANT
PENCIL SMOKE EVACUATOR (MISCELLANEOUS) IMPLANT
PORT ACCESS TROCAR AIRSEAL 12 (TROCAR) ×1 IMPLANT
PORT ACCESS TROCAR AIRSEAL 5M (TROCAR) ×1
POUCH SPECIMEN RETRIEVAL 10MM (ENDOMECHANICALS) IMPLANT
SCISSORS LAP 5X35 DISP (ENDOMECHANICALS) ×2 IMPLANT
SCRUB CHG 4% DYNA-HEX 4OZ (MISCELLANEOUS) ×1 IMPLANT
SEAL CANN UNIV 5-8 DVNC XI (MISCELLANEOUS) ×4 IMPLANT
SEAL XI 5MM-8MM UNIVERSAL (MISCELLANEOUS) ×8
SET TRI-LUMEN FLTR TB AIRSEAL (TUBING) ×2 IMPLANT
SPONGE LAP 18X18 RF (DISPOSABLE) IMPLANT
SURGIFLO W/THROMBIN 8M KIT (HEMOSTASIS) IMPLANT
SUT MNCRL AB 4-0 PS2 18 (SUTURE) IMPLANT
SUT PDS AB 1 TP1 96 (SUTURE) ×4 IMPLANT
SUT VIC AB 0 CT1 27 (SUTURE)
SUT VIC AB 0 CT1 27XBRD ANTBC (SUTURE) IMPLANT
SUT VIC AB 2-0 CT1 27 (SUTURE)
SUT VIC AB 2-0 CT1 TAPERPNT 27 (SUTURE) IMPLANT
SUT VICRYL 4-0 PS2 18IN ABS (SUTURE) ×4 IMPLANT
SYR 10ML LL (SYRINGE) IMPLANT
TOWEL OR NON WOVEN STRL DISP B (DISPOSABLE) ×2 IMPLANT
TRAP SPECIMEN MUCUS 40CC (MISCELLANEOUS) IMPLANT
TRAY FOLEY MTR SLVR 16FR STAT (SET/KITS/TRAYS/PACK) ×2 IMPLANT
TROCAR XCEL NON-BLD 5MMX100MML (ENDOMECHANICALS) IMPLANT
UNDERPAD 30X36 HEAVY ABSORB (UNDERPADS AND DIAPERS) ×2 IMPLANT
WATER STERILE IRR 1000ML POUR (IV SOLUTION) ×2 IMPLANT
YANKAUER SUCT BULB TIP 10FT TU (MISCELLANEOUS) ×2 IMPLANT

## 2020-05-01 NOTE — Interval H&P Note (Signed)
History and Physical Interval Note:  No changes.    05/01/2020 9:47 AM  Christy Waller  has presented today for surgery, with the diagnosis of ENDOMETRIAL CANCER.  The various methods of treatment have been discussed with the patient and family. After consideration of risks, benefits and other options for treatment, the patient has consented to  Procedure(s): XI ROBOTIC ASSISTED TOTAL HYSTERECTOMY WITH BILATERAL SALPINGO OOPHORECTOMY (N/A) SENTINEL NODE BIOPSY (N/A) POSSIBLE LYMPH NODE DISSECTION (N/A) POSSIBLE LAPAROTOMY (N/A) as a surgical intervention.  The patient's history has been reviewed, patient examined, no change in status, stable for surgery.  I have reviewed the patient's chart and labs.  Questions were answered to the patient's satisfaction.     Lahoma Crocker

## 2020-05-01 NOTE — Anesthesia Procedure Notes (Signed)
Procedure Name: Intubation Date/Time: 05/01/2020 10:09 AM Performed by: Eben Burow, CRNA Pre-anesthesia Checklist: Patient identified, Emergency Drugs available, Suction available, Patient being monitored and Timeout performed Patient Re-evaluated:Patient Re-evaluated prior to induction Oxygen Delivery Method: Circle system utilized Preoxygenation: Pre-oxygenation with 100% oxygen Induction Type: IV induction Ventilation: Mask ventilation without difficulty Laryngoscope Size: Mac and 4 Grade View: Grade I Tube type: Oral Tube size: 7.0 mm Number of attempts: 1 Airway Equipment and Method: Stylet Placement Confirmation: ETT inserted through vocal cords under direct vision,  positive ETCO2 and breath sounds checked- equal and bilateral Secured at: 21 cm Tube secured with: Tape Dental Injury: Teeth and Oropharynx as per pre-operative assessment

## 2020-05-01 NOTE — Op Note (Addendum)
OPERATIVE NOTE  Pre-operative Diagnosis: endometrial cancer grade 2  Post-operative Diagnosis: same  Operation: Robotic-assisted laparoscopic total hysterectomy with bilateral salpingoophorectomy, SLN biopsy, mini-lap for specimen removal   Surgeon: Jeral Pinch MD  Assistant Surgeon: Lahoma Crocker MD (an MD assistant was necessary for tissue manipulation, management of robotic instrumentation, retraction and positioning due to the complexity of the case and hospital policies).   Anesthesia: GET  Urine Output: 500cc  Operative Findings: On EUA, 10cm mobile uterus. On intra-abdominal entry, normal upper abdominal survey. Normal small and large bowel. Omentum adherent to the anterior abdominal wall. Sigmoid epiploica adherent to the posterior uterus. Uterus 10-12cm, bulbous fundus. Adnexa adherent to posterior uterus, otherwise normal appearing. Mapping successful to right external and right obturator SLN, left obturator SLN, right presacral. All lymph nodes prominent.  No intra-abdominal or pelvic evidence of disease otherwise.  Some adhesions between the bladder and lower uterine segment/cervix, consistent with patient's prior C-section history.  Estimated Blood Loss:  100 cc      Total IV Fluids: see I&O flowsheet         Specimens: uterus, cervix, bilateral tubes and ovaries, external iliac SLN on the right, right obturator SLN, left obturator SLN, right presacral         Complications:  None apparent; patient tolerated the procedure well.         Disposition: PACU - hemodynamically stable.  Procedure Details  The patient was seen in the Holding Room. The risks, benefits, complications, treatment options, and expected outcomes were discussed with the patient.  The patient concurred with the proposed plan, giving informed consent.  The site of surgery properly noted/marked. The patient was identified as Christy Waller and the procedure verified as a Robotic-assisted  hysterectomy with bilateral salpingo oophorectomy with SLN biopsy.   After induction of anesthesia, the patient was draped and prepped in the usual sterile manner. Patient was placed in supine position after anesthesia and draped and prepped in the usual sterile manner as follows: Her arms were tucked to her side with all appropriate precautions.  The shoulders were stabilized with padded shoulder blocks applied to the acromium processes.  The patient was placed in the semi-lithotomy position in [place].  The perineum and vagina were prepped with CholoraPrep. The patient was draped after the CholoraPrep had been allowed to dry for 3 minutes.  A Time Out was held and the above information confirmed.  The urethra was prepped with Betadine. Foley catheter was placed.  A sterile speculum was placed in the vagina.  The cervix was grasped with a single-tooth tenaculum. 2mg  total of ICG was injected into the cervical stroma at 2 and 9 o'clock with 1cc injected at a 1cm and 62mm depth (concentration 0.5mg /ml) in all locations. The cervix was dilated with Kennon Rounds dilators.  The ZUMI uterine manipulator with a medium colpotomizer ring was placed without difficulty.  A pneum occluder balloon was placed over the manipulator.  OG tube placement was confirmed and to suction.   Next, a 10 mm skin incision was made 1 cm below the subcostal margin in the midclavicular line.  The 5 mm Optiview port and scope was used for direct entry.  Opening pressure was under 10 mm CO2.  The abdomen was insufflated and the findings were noted as above.   At this point and all points during the procedure, the patient's intra-abdominal pressure did not exceed 15 mmHg. Next, an 8 mm skin incision was made  the umbilicus and a right  and left port were placed about 8 cm lateral to the robot port on the right and left side.  A fourth arm was placed on the right.  The 5 mm assist trocar was exchanged for a 10-12 mm port. All ports were placed  under direct visualization.  The patient was placed in steep Trendelenburg.  Sharp dissection with electrocautery for hemostasis was used to lyse filmy adhesions of the omentum to the anterior abdominal wall at the umbilicus.  The robot was docked in the normal manner.  The right and left peritoneum were opened parallel to the IP ligament to open the retroperitoneal spaces bilaterally. The round ligaments were transected. The SLN mapping was performed in bilateral pelvic basins. After identifying the ureters, the para rectal and paravesical spaces were opened up entirely with careful dissection below the level of the ureters bilaterally and to the depth of the uterine artery origin in order to skeletonize the uterine "web" and ensure visualization of all parametrial channels. The para-aortic basins were carefully exposed and evaluated for isolated para-aortic SLN's. Lymphatic channels were identified travelling to the following visualized sentinel lymph node's: right obturator, right external iliac, left obturator, right presacral. These SLN's were separated from their surrounding lymphatic tissue, removed and sent for permanent pathology.  Sharp dissection was used to lyse adhesions of the sigmoid epiploica to the posterior uterus.  The hysterectomy was started.  The ureter was again noted to be on the medial leaf of the broad ligament.  The peritoneum above the ureter was incised and stretched and the infundibulopelvic ligament was skeletonized, cauterized and cut.  The posterior peritoneum was taken down to the level of the KOH ring.  The anterior peritoneum was also taken down.  The bladder flap was created to the level of the KOH ring.  The uterine artery on the right side was skeletonized, cauterized and cut in the normal manner.  A similar procedure was performed on the left.  The colpotomy was made. The uterine specimen was too large to deliver through the vagina. The specimen was placed in a 52mm  Endocatch bag for removal later.  Pedicles were inspected and excellent hemostasis was achieved.    The colpotomy at the vaginal cuff was closed with Vicryl on a CT1 needle in a running manner.  Irrigation was used and excellent hemostasis was achieved.  At this point in the procedure was completed.  Robotic instruments were removed under direct visulaization.  The robot was undocked.   With the abdomen still insufflated, the supraumbilical incision was increased to a length of 4-5cm and carried down to the peritoneum. The uterine specimen was delivered in the bag through the incision. The fascia was closed with two running looped #0 PDS tied in the midline. The incision was irrigated and local anesthesia was injected. The subcutaneous tissue was closed with 2-0 Vicryl in a running manner.   The fascia at the 10-12 mm port was closed with 0 Vicryl on a UR-5 needle.  The subcuticular tissue at all incisions was closed with 4-0 Vicryl and the skin was closed with 4-0 Monocryl in a subcuticular manner.  Dermabond was applied.  A honeycomb dressing was applied to the mini-lap site.  The vagina was swabbed with  minimal bleeding noted.   All sponge, lap and needle counts were correct x  3. Foley catheter was removed.  The patient was transferred to the recovery room in stable condition.  Jeral Pinch, MD

## 2020-05-01 NOTE — Discharge Instructions (Signed)
Return to work: 4 weeks (2 weeks with physical restrictions).  Activity: 1. Be up and out of the bed during the day.  Take a nap if needed.  You may walk up steps but be careful and use the hand rail.  Stair climbing will tire you more than you think, you may need to stop part way and rest.   2. No lifting or straining for 4 weeks.  3. No driving for 1 weeks.  Do Not drive if you are taking narcotic pain medicine.  4. Shower daily.  Use soap and water on your incision and pat dry; don't rub.   5. No sexual activity and nothing in the vagina for 8 weeks.  Medications:  - Take ibuprofen and tylenol first line for pain control. Take these regularly (every 6 hours) to decrease the build up of pain.  - If necessary, for severe pain not relieved by ibuprofen, contact Dr. Nadara Mode office and you will be prescribed percocet.  - While taking percocet you should take sennakot every night to reduce the likelihood of constipation. If this causes diarrhea, stop its use.  Diet: 1. Low sodium Heart Healthy Diet is recommended.  2. It is safe to use a laxative if you have difficulty moving your bowels.   Wound Care: 1. Keep clean and dry.  Shower daily.  Reasons to call the Doctor:   Fever - Oral temperature greater than 100.4 degrees Fahrenheit  Foul-smelling vaginal discharge  Difficulty urinating  Nausea and vomiting  Increased pain at the site of the incision that is unrelieved with pain medicine.  Difficulty breathing with or without chest pain  New calf pain especially if only on one side  Sudden, continuing increased vaginal bleeding with or without clots.   Follow-up: 1. See Wyvonnia Lora in 4 weeks.  Contacts: For questions or concerns you should contact:  Dr. Wyvonnia Lora at [phone] After hours and on week-ends call [phone] and ask to speak to the physician on call for Gynecologic Oncology  After Your Surgery  The information in this section  will tell you what to expect after your surgery, both during your stay and after you leave. You will learn how to safely recover from your surgery. Write down any questions you have and be sure to ask your doctor or nurse.  What to Expect When you wake up after your surgery, you will be in the [facility] (PACU) or your recovery room. A nurse will be monitoring your body temperature, blood pressure, pulse, and oxygen levels. You may have a urinary catheter in your bladder to help monitor the amount of urine you are making. It should come out before you go home. You will also have compression boots on your lower legs to help your circulation. Your pain medication will be given through an IV line or in tablet form. If you are having pain, tell your nurse. Your nurse will tell you how to recover from your surgery. Below are examples of ways you can help yourself recover safely. . You will be encouraged to walk with the help of your nurse or physical therapist. We will give you medication to relieve pain. Walking helps reduce the risk for blood clots and pneumonia. It also helps to stimulate your bowels so they begin working again. . Use your incentive spirometer. This will help your lungs expand, which prevents pneumonia.   Commonly Asked Questions  Will I have pain after surgery? Yes, you will have  some pain after your surgery, especially in the first few days. Your doctor and nurse will ask you about your pain often. You will be given medication to manage your pain as needed. If your pain is not relieved, please tell your doctor or nurse. It is important to control your pain so you can cough, breathe deeply, use your incentive spirometer, and get out of bed and walk.  Will I be able to eat? Yes, you will be able to eat a regular diet or eat as tolerated. You should start with foods that are soft and easy to digest such as apple sauce and chicken noodle soup. Eat small meals frequently,  and then advance to regular foods. If you experience bloating, gas, or cramps, limit high-fiber foods, including whole grain breads and cereal, nuts, seeds, salads, fresh fruit, broccoli, cabbage, and cauliflower. Will I have pain when I am home? The length of time each person has pain or discomfort varies. You may still have some pain when you go home and will probably be taking pain medication. Follow the guidelines below. . Take your medications as directed and as needed. . Call your doctor if the medication prescribed for you doesn't relieve your pain. . Don't drive or drink alcohol while you're taking prescription pain medication. . As your incision heals, you will have less pain and need less pain medication. A mild pain reliever such as acetaminophen (Tylenol) or ibuprofen (Advil) will relieve aches and discomfort. However, large quantities of acetaminophen may be harmful to your liver. Don't take more acetaminophen than the amount directed on the bottle or as instructed by your doctor or nurse. . Pain medication should help you as you resume your normal activities. Take enough medication to do your exercises comfortably. Pain medication is most effective 30 to 45 minutes after taking it. [name] Keep track of when you take your pain medication. Taking it when your pain first begins is more effective than waiting for the pain to get worse. Pain medication may cause constipation (having fewer bowel movements than what is normal for you).  How can I prevent constipation? . Go to the bathroom at the same time every day. Your body will get used to going at that time. . If you feel the urge to go, don't put it off. Try to use the bathroom 5 to 15 minutes after meals. . After breakfast is a good time to move your bowels. The reflexes in your colon are strongest at this time. . Exercise, if you can. Walking is an excellent form of exercise. . Drink 8 (8-ounce) glasses (2 liters) of liquids daily, if you  can. Drink water, juices, soups, ice cream shakes, and other drinks that don't have caffeine. Drinks with caffeine, such as coffee and soda, pull fluid out of the body. . Slowly increase the fiber in your diet to 25 to 35 grams per day. Fruits, vegetables, whole grains, and cereals contain fiber. If you have an ostomy or have had recent bowel surgery, check with your doctor or nurse before making any changes in your diet. . Both over-the-counter and prescription medications are available to treat constipation. Start with 1 of the following over-the-counter medications first: o Docusate sodium (Colace) 100 mg. Take ___1__ capsules _2____ times a day. This is a stool softener that causes few side effects. Don't take it with mineral oil. o Polyethylene glycol (MiraLAX) 17 grams daily. o Senna (Senokot) 2 tablets at bedtime. This is a stimulant laxative, which can cause  cramping. . If you haven't had a bowel movement in 2 days, call your doctor or nurse.  Can I shower? Yes, you should shower 24 hours after your surgery. Be sure to shower every day. Taking a warm shower is relaxing and can help decrease muscle aches. Use soap when you shower and gently wash your incision. Pat the areas dry with a towel after showering, and leave your incision uncovered (unless there is drainage). Call your doctor if you see any redness or drainage from your incision. Don't take tub baths until you discuss it with your doctor at the first appointment after your surgery. How do I care for my incisions? You will have several small incisions on your abdomen. The incisions are closed with Steri-Strips or Dermabond. You may also have square white dressings on your incisions ([place]). You can remove these in the shower 24 hours after your surgery. You should clean your incisions with soap and water. If you go home with Steri-Strips on your incision, they will loosen and may fall off by themselves. If they haven't fallen off  within 10 days, you can remove them. If you go home with Dermabond over your sutures (stitches), it will also loosen and peel off.  What are the most common symptoms after a hysterectomy? It's common for you to have some vaginal spotting or light bleeding. You should monitor this with a pad or a panty liner. If you have having heavy bleeding (bleeding through a pad or liner every 1 to 2 hours), call your doctor right away. It's also common to have some discomfort after surgery from the air that was pumped into your abdomen during surgery. To help with this, walk, drink plenty of liquids and make sure to take the stool softeners you received.  When is it safe for me to drive? You may resume driving 2 weeks after surgery, as long as you are not taking pain medication that may make you drowsy.  When can I resume sexual activity? Do not place anything in your vagina or have vaginal intercourse for 8 weeks after your surgery. Some people will need to wait longer than 8 weeks, so speak with your doctor before resuming sexual intercourse.  Will I be able to travel? Yes, you can travel. If you are traveling by plane within a few weeks after your surgery, make sure you get up and walk every hour. Be sure to stretch your legs, drink plenty of liquids, and keep your feet elevated when possible.  Will I need any supplies? Most people do not need any supplies after the surgery. In the rare case that you do need supplies, such as tubes or drains, your nurse will order them for you.  When can I return to work? The time it takes to return to work depends on the type of work you do, the type of surgery you had, and how fast your body heals. Most people can return to work about 2 to 4 weeks after the surgery.  What exercises can I do? Exercise will help you gain strength and feel better. Walking and stair climbing are excellent forms of exercise. Gradually increase the distance you walk. Climb stairs slowly,  resting or stopping as needed. Ask your doctor or nurse before starting more strenuous exercises.  When can I lift heavy objects? Most people should not lift anything heavier than 10 pounds (4.5 kilograms) for at least 4 weeks after surgery. Speak with your doctor about when you can do heavy  lifting.  How can I cope with my feelings? After surgery for a serious illness, you may have new and upsetting feelings. Many people say they felt weepy, sad, worried, nervous, irritable, and angry at one time or another. You may find that you can't control some of these feelings. If this happens, it's a good idea to seek emotional support. The first step in coping is to talk about how you feel. Family and friends can help. Your nurse, doctor, and social worker can reassure, support, and guide you. It's always a good idea to let these professionals know how you, your family, and your friends are feeling emotionally. Many resources are available to patients and their families. Whether you're in the hospital or at home, the nurses, doctors, and social workers are here to help you and your family and friends handle the emotional aspects of your illness.  When is my first appointment after surgery? Your first appointment after surgery will be 2 to 4 weeks after surgery. Your nurse will give you instructions on how to make this appointment, including the phone number to call.  What if I have other questions? If you have any questions or concerns, please talk with your doctor or nurse. You can reach them Monday through Friday from 9:00 am to 5:00 pm. After 5:00 pm, during the weekend, and on holidays, call [phone] and ask for the doctor on call for your doctor.  . Have a temperature of 101 F (38.3 C) or higher . Have pain that does not get better with pain medication . Have redness, drainage, or swelling from your incisions

## 2020-05-01 NOTE — Anesthesia Procedure Notes (Deleted)
Performed by: Eben Burow, CRNA

## 2020-05-01 NOTE — Anesthesia Preprocedure Evaluation (Addendum)
Anesthesia Evaluation  Patient identified by MRN, date of birth, ID band  Reviewed: Allergy & Precautions, NPO status , Patient's Chart, lab work & pertinent test results  Airway Mallampati: II  TM Distance: >3 FB Neck ROM: Full    Dental no notable dental hx.    Pulmonary Patient abstained from smoking., former smoker,    Pulmonary exam normal breath sounds clear to auscultation       Cardiovascular Exercise Tolerance: Good hypertension, Normal cardiovascular exam Rhythm:Regular Rate:Normal  Sinus bradycardia Left anterior fasicular block Otherwise normal ECG No significant change since last tracing Confirmed by Skeet Latch [phone]) on 04/25/2020 2:10:56 PM   Neuro/Psych negative neurological ROS  negative psych ROS   GI/Hepatic negative GI ROS, Neg liver ROS,   Endo/Other  negative endocrine ROS  Renal/GU negative Renal ROS     Musculoskeletal negative musculoskeletal ROS (+)   Abdominal Normal abdominal exam  (+)   Peds  Hematology negative hematology ROS (+)   Anesthesia Other Findings Endometrial cancer  Reproductive/Obstetrics negative OB ROS                            Anesthesia Physical Anesthesia Plan  ASA: III  Anesthesia Plan: General   Post-op Pain Management:    Induction: Intravenous  PONV Risk Score and Plan: 3 and Midazolam, Scopolamine patch - Pre-op, Dexamethasone and Ondansetron  Airway Management Planned: Oral ETT  Additional Equipment:   Intra-op Plan:   Post-operative Plan:   Informed Consent: I have reviewed the patients History and Physical, chart, labs and discussed the procedure including the risks, benefits and alternatives for the proposed anesthesia with the patient or authorized representative who has indicated his/her understanding and acceptance.     Dental advisory given  Plan Discussed with:   Anesthesia Plan Comments:         Anesthesia Quick Evaluation

## 2020-05-01 NOTE — Anesthesia Postprocedure Evaluation (Signed)
Anesthesia Post Note  Patient: Christy Waller  Procedure(s) Performed: XI ROBOTIC ASSISTED TOTAL HYSTERECTOMY WITH BILATERAL SALPINGO OOPHORECTOMY AND MINI LAPAROTOMY (N/A ) SENTINEL NODE BIOPSY (N/A )     Patient location during evaluation: PACU Anesthesia Type: General Level of consciousness: awake and alert Pain management: pain level controlled Vital Signs Assessment: post-procedure vital signs reviewed and stable Respiratory status: spontaneous breathing, nonlabored ventilation, respiratory function stable and patient connected to nasal cannula oxygen Cardiovascular status: blood pressure returned to baseline and stable Postop Assessment: no apparent nausea or vomiting Anesthetic complications: no   No complications documented.  Last Vitals:  Vitals:   05/01/20 1425 05/01/20 1445  BP: (!) 148/93 (!) 146/98  Pulse: 62 63  Resp: 12 13  Temp: 36.5 C 36.6 C  SpO2: 98% 100%    Last Pain:  Vitals:   05/01/20 1445  TempSrc:   PainSc: 0-No pain                 Merlinda Frederick

## 2020-05-01 NOTE — Transfer of Care (Signed)
Immediate Anesthesia Transfer of Care Note  Patient: Christy Waller  Procedure(s) Performed: XI ROBOTIC ASSISTED TOTAL HYSTERECTOMY WITH BILATERAL SALPINGO OOPHORECTOMY AND MINI LAPAROTOMY (N/A ) SENTINEL NODE BIOPSY (N/A )  Patient Location: PACU  Anesthesia Type:General  Level of Consciousness: awake, alert  and patient cooperative  Airway & Oxygen Therapy: Patient Spontanous Breathing and Patient connected to face mask oxygen  Post-op Assessment: Report given to RN and Post -op Vital signs reviewed and stable  Post vital signs: Reviewed and stable  Last Vitals:  Vitals Value Taken Time  BP 154/131 05/01/20 1257  Temp    Pulse    Resp 19 05/01/20 1258  SpO2    Vitals shown include unvalidated device data.  Last Pain:  Vitals:   05/01/20 0920  TempSrc: Oral  PainSc:       Patients Stated Pain Goal: 5 (88/28/00 3491)  Complications: No complications documented.

## 2020-05-02 ENCOUNTER — Telehealth: Payer: Self-pay

## 2020-05-02 ENCOUNTER — Encounter (HOSPITAL_COMMUNITY): Payer: Self-pay | Admitting: Gynecologic Oncology

## 2020-05-02 NOTE — Telephone Encounter (Signed)
Christy Waller states that she is doing well. She is eating, drinking, and urinating well. Afebrile. She has not passed gas. She will increase the senokot-s tabs to 2 tabs bid.  If no BM by Sunday 05-04-20, instructed her to add 1 capful of Miralax bid Incisions are D&I.  Told her that the honeycomb dressing can be removed 5 days post op which will be Tuesday 05-06-20. Pain controlled with Ibuprofen alt.with tylenol. No oxycodone needed. Pt aware of her post op appointments as well as the office number [phone] if she has any questions or concerns.

## 2020-05-08 ENCOUNTER — Inpatient Hospital Stay (HOSPITAL_BASED_OUTPATIENT_CLINIC_OR_DEPARTMENT_OTHER): Payer: 59 | Admitting: Gynecologic Oncology

## 2020-05-08 ENCOUNTER — Encounter: Payer: Self-pay | Admitting: Gynecologic Oncology

## 2020-05-08 DIAGNOSIS — C541 Malignant neoplasm of endometrium: Secondary | ICD-10-CM

## 2020-05-08 DIAGNOSIS — Z90722 Acquired absence of ovaries, bilateral: Secondary | ICD-10-CM

## 2020-05-08 DIAGNOSIS — Z90710 Acquired absence of both cervix and uterus: Secondary | ICD-10-CM

## 2020-05-08 NOTE — Progress Notes (Signed)
Gynecologic Oncology Telehealth Consult Note: Gyn-Onc  I connected with Christy Waller on 05/08/20 at  3:15 PM EDT by telephone and verified that I am speaking with the correct person using two identifiers.  I discussed the limitations, risks, security and privacy concerns of performing an evaluation and management service by telemedicine and the availability of in-person appointments. I also discussed with the patient that there may be a patient responsible charge related to this service. The patient expressed understanding and agreed to proceed.  Other persons participating in the visit and their role in the encounter: none.  Patient's location: home Provider's location: hospital  Reason for Visit: f/u after surgery, pathology discussion  Treatment History: Oncology History  Endometrial cancer [facility])   Initial Diagnosis   Endometrial cancer ([place])   04/17/2020 Initial Biopsy   EMB: Grade 2 endometrioid adenocarcinoma    05/01/2020 Surgery   Robotic-assisted laparoscopic total hysterectomy with bilateral salpingoophorectomy, SLN biopsy, mini-lap for specimen removal  On EUA, 10cm mobile uterus. On intra-abdominal entry, normal upper abdominal survey. Normal small and large bowel. Omentum adherent to the anterior abdominal wall. Sigmoid epiploica adherent to the posterior uterus. Uterus 10-12cm, bulbous fundus. Adnexa adherent to posterior uterus, otherwise normal appearing. Mapping successful to right external and right obturator SLN, left obturator SLN, right presacral. All lymph nodes prominent.  No intra-abdominal or pelvic evidence of disease otherwise.  Some adhesions between the bladder and lower uterine segment/cervix, consistent with patient's prior C-section history.   05/01/2020 Pathology Results   A. UTERUS, CERVIX, BILATERAL TUBES AND OVARIES, RESECTION:   Uterus:  -  Endometrioid carcinoma, FIGO grade 2  -  See oncology table and comment below   Cervix:  -  No carcinoma  identified   Bilateral Ovaries:  -  No carcinoma identified   B. LYMPH NODE, SENTINEL, RIGHT OBTURATOR, EXCISION:  -  No carcinoma identified in one lymph node (0/1)  -  See comment   C. LYMPH NODE, SENTINEL, RIGHT EXTERNAL ILIAC, EXCISION:  -  No carcinoma identified in one lymph node (0/1)  -  See comment   D. LYMPH NODE, SENTINEL, RIGHT PRE-SACRAL, EXCISION:  -  No carcinoma identified in one lymph node (0/1)  -  See comment   E. LYMPH NODE, SENTINEL, LEFT OBTURATOR, EXCISION:  -  No carcinoma identified in one lymph node (0/1)  -  See comment URGICAL PATHOLOGY    05/01/2020 Cancer Staging   Staging form: Corpus Uteri - Carcinoma and Carcinosarcoma, AJCC 8th Edition - Clinical stage from 05/01/2020: FIGO Stage IB (cT1b, cN0(sn), cM0) - Signed by [name], MD on 05/08/2020     Interval History: The patient reports healing well from surgery. She denies vaginal bleeding, nausea or emesis. Tolerating PO. Having normal bowel movements and denies urinary symptoms. Walking more each day. Has some discomfort related to her incisions.   Past Medical/Surgical History: Past Medical History:  Diagnosis Date  . Anemia   . Bradycardia    Event monitor 8/13: NSR, sinus bradycardia, sinus tachycardia, no significant bradycardia arrhythmias  . Chest pain 03/22/2012   Normal Myoview, normal echo, chest CT negative for pulmonary embolism  . History of nuclear stress test 03/2012   Myoview 03/23/12: EF 50%, no ischemia or scar.  [name] HTN (hypertension)    2-D echocardiogram 03/22/12: EF 50%, mild LAE, no wall motion abnormalities.  . Tubal pregnancy    In her late 20's  . Vitamin D deficiency     Past Surgical History:  Procedure Laterality Date  . CESAREAN SECTION     x2  . ECTOPIC PREGNANCY SURGERY    . ROBOTIC ASSISTED TOTAL HYSTERECTOMY WITH BILATERAL SALPINGO OOPHERECTOMY N/A 05/01/2020   Procedure: XI ROBOTIC ASSISTED TOTAL HYSTERECTOMY WITH BILATERAL SALPINGO OOPHORECTOMY AND  MINI LAPAROTOMY;  Surgeon: [name], MD;  Location: [facility];  Service: Gynecology;  Laterality: N/A;  . SENTINEL NODE BIOPSY N/A 05/01/2020   Procedure: SENTINEL NODE BIOPSY;  Surgeon: [name], MD;  Location: [facility];  Service: Gynecology;  Laterality: N/A;  . TUBAL LIGATION      Family History  Problem Relation Age of Onset  . Diabetes type II Sister   . Hypertension Mother   . Hypertension Father   . Hypertension Sister   . Breast cancer Maternal Aunt   . Breast cancer Cousin        maternal cousin    Social History   Socioeconomic History  . Marital status: Married    Spouse name: Not on file  . Number of children: 2  . Years of education: Not on file  . Highest education level: Not on file  Occupational History  . Occupation: packer/driver    Comment: [employer]  Tobacco Use  . Smoking status: Former Smoker    Packs/day: 0.20    Years: 25.00    Pack years: 5.00    Types: Cigarettes    Quit date: 09/29/2016    Years since quitting: 3.6  . Smokeless tobacco: Never Used  Vaping Use  . Vaping Use: Never used  Substance and Sexual Activity  . Alcohol use: Yes    Comment: occasional  . Drug use: Yes    Types: Marijuana    Comment: occ   . Sexual activity: Yes  Other Topics Concern  . Not on file  Social History Narrative  . Not on file   Social Determinants of Health   Financial Resource Strain:   . Difficulty of Paying Living Expenses: Not on file  Food Insecurity:   . Worried About Charity fundraiser in the Last Year: Not on file  . Ran Out of Food in the Last Year: Not on file  Transportation Needs:   . Lack of Transportation (Medical): Not on file  . Lack of Transportation (Non-Medical): Not on file  Physical Activity:   . Days of Exercise per Week: Not on file  . Minutes of Exercise per Session: Not on file  Stress:   . Feeling of Stress : Not on file  Social Connections:   . Frequency of Communication with Friends  and Family: Not on file  . Frequency of Social Gatherings with Friends and Family: Not on file  . Attends Religious Services: Not on file  . Active Member of Clubs or Organizations: Not on file  . Attends Archivist Meetings: Not on file  . Marital Status: Not on file    Current Medications:  Current Outpatient Medications:  .  acetaminophen (TYLENOL) 500 MG tablet, Take 1,000 mg by mouth every 6 (six) hours as needed for mild pain., Disp: , Rfl:  .  amLODipine (NORVASC) 10 MG tablet, Take 10 mg by mouth daily. , Disp: , Rfl:  .  ibuprofen (ADVIL) 800 MG tablet, Take 1 tablet (800 mg total) by mouth every 8 (eight) hours as needed for moderate pain. For AFTER surgery, Disp: 30 tablet, Rfl: 0 .  Multiple Vitamin (MULTIVITAMIN WITH MINERALS) TABS tablet, Take 1 tablet by mouth  daily., Disp: , Rfl:  .  oxyCODONE (OXY IR/ROXICODONE) 5 MG immediate release tablet, Take 1 tablet (5 mg total) by mouth every 4 (four) hours as needed for severe pain. For AFTER surgery, do not take and drive, Disp: 10 tablet, Rfl: 0 .  senna-docusate (SENOKOT-S) 8.6-50 MG tablet, Take 2 tablets by mouth at bedtime. For AFTER surgery, do not take if having diarrhea, Disp: 30 tablet, Rfl: 0 .  VITAMIN D PO, Take 1 tablet by mouth daily., Disp: , Rfl:   Review of Symptoms: Pertinent positives as above in Interval History, other ROS negative.  Physical Exam: There were no vitals taken for this visit. Deferred given limitations of phone visit  Laboratory & Radiologic Studies: None new  Assessment & Plan: Christy Waller is a 49 y.o. woman with Stage IB grade 2 endometrial cancer who presents for discussion of pathology results.  Meeting early post-op milestones. Reviewed expectations and limitations. Discussed in detail pathology report. Patient happy with news that tumor confined to the uterus. I am somewhat concerned given depth of myometrial invasion and LVSI seen. I will wait to clarify with  pathology if LVSI was focal or extensive. If extensive, would likely recommend pelvic RT over vaginal brachytherapy alone. All of patient's questions answered.   I discussed the assessment and treatment plan with the patient. The patient was provided with an opportunity to ask questions and all were answered. The patient agreed with the plan and demonstrated an understanding of the instructions.   The patient was advised to call back or see an in-person evaluation if the symptoms worsen or if the condition fails to improve as anticipated.   18 minutes of total time was spent for this patient encounter, including preparation, face-to-face counseling with the patient and coordination of care, and documentation of the encounter.   Jeral Pinch, MD  Division of Gynecologic Oncology  Department of Obstetrics and Gynecology  [facility]

## 2020-05-12 ENCOUNTER — Telehealth: Payer: Self-pay | Admitting: Oncology

## 2020-05-12 ENCOUNTER — Other Ambulatory Visit: Payer: Self-pay | Admitting: Oncology

## 2020-05-12 LAB — SURGICAL PATHOLOGY

## 2020-05-12 NOTE — Telephone Encounter (Signed)
Called Offie and advised her that Dr. Berline Lopes will call her with treatment recommendations once she received results from pathology.  She verbalized understanding and agreement.

## 2020-05-12 NOTE — Progress Notes (Signed)
Gynecologic Oncology Multi-Disciplinary Disposition Conference Note  Date of the Conference: 05/12/2020  Patient Name: Christy Waller  Referring Provider: Dr. Delora Fuel Primary GYN Oncologist: Dr. Berline Lopes  Stage/Disposition:  Stage 1B, grade 2 endometrioid carcinoma. Pathology to determine if LVSI was focal or extensive.  If extensive, consideration for the [facility] GY020 research study or radiation therapy.   This Multidisciplinary conference took place involving physicians from [place], [place], Radiation Oncology, Pathology, Radiology along with the Gynecologic Oncology Nurse Practitioner and RN.  Comprehensive assessment of the patient's malignancy, staging, need for surgery, chemotherapy, radiation therapy, and need for further testing were reviewed. Supportive measures, both inpatient and following discharge were also discussed. The recommended plan of care is documented. Greater than 35 minutes were spent correlating and coordinating this patient's care.

## 2020-05-14 ENCOUNTER — Telehealth: Payer: Self-pay | Admitting: Gynecologic Oncology

## 2020-05-14 ENCOUNTER — Telehealth: Payer: Self-pay | Admitting: Oncology

## 2020-05-14 ENCOUNTER — Encounter: Payer: Self-pay | Admitting: Oncology

## 2020-05-14 DIAGNOSIS — C541 Malignant neoplasm of endometrium: Secondary | ICD-10-CM

## 2020-05-14 NOTE — Telephone Encounter (Signed)
Called patient to discuss pathology review, which shows extensive LVSI.  Given this risk factor as well as her other intermediate-high risk factors, we discussed her case at our tumor board and agree that she would benefit from full pelvic radiation.  Additionally, I talked to her about our open study for clinical stage I with high risk features and stage II endometrial cancer randomized to radiation alone versus radiation with pembrolizumab.  Patient would be interested in speaking with the study coordinator.  Jeral Pinch MD Gynecologic Oncology

## 2020-05-14 NOTE — Telephone Encounter (Signed)
Called referral to research to see if patient qualifies for the [place] GY020 study.  They will meet with Kennyth Lose on 05/19/20 when she is here for her radiation appointments.

## 2020-05-16 NOTE — Progress Notes (Signed)
Patient here for a new consult with Dr. Sondra Come.  Biopsies revealed: Uterus:  - Endometrioid carcinoma, FIGO grade 2    Sew 9/16 note from Dr. Berline Lopes  Past/Anticipated interventions by Gyn/Onc surgery, if any:  Hysterectomy 05/01/2020   Past/Anticipated interventions by medical oncology, if any: no chemo  Weight changes, if any: 40 lbs lost since April by choice.   Bowel/Bladder complaints, if any: no  Nausea/Vomiting, if any: no   Pain issues, if any:  Left pelvic pain '4"   SAFETY ISSUES:   Prior radiation? no   Pacemaker/ICD? no   Possible current pregnancy?  Hysterectomy   Is the patient on methotrexate? no   Current Complaints / other details:  Accompanied by her husband  BP (!) 164/98 (BP Location: Right Arm, Patient Position: Sitting, Cuff Size: Normal)   Pulse (!) 58   Temp 98.7 F (37.1 C) (Oral)   Resp 16   Ht 5\' 2"  (1.575 m)   Wt 148 lb (67.1 kg)   SpO2 100%   BMI 27.07 kg/m    Wt Readings from Last 3 Encounters:  05/19/20 148 lb (67.1 kg)  05/01/20 144 lb (65.3 kg)  04/28/20 144 lb (65.3 kg)

## 2020-05-18 NOTE — Progress Notes (Signed)
Radiation Oncology         [phone] ________________________________  Initial Outpatient Consultation  Name: Christy Waller MRN: [id number]  Date: 05/19/2020  DOB: February 28, 1971  [id number], [place] @ [facility] Doffing, Corinna Lines, MD   REFERRING PHYSICIAN: [name], MD  DIAGNOSIS: The encounter diagnosis was Endometrial cancer [facility]).  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 female who is seen as a courtesy of Dr. Berline Lopes for an opinion concerning radiation therapy as part of management for her recently diagnosed endometrial cancer. Today, she is accompanied by her husband. The patient presented to Dr. Delora Fuel. OB-GYN, on 04/15/2020 for evaluation of 1-2 year history of abnormal vaginal bleeding. Endometrial biopsy was performed at that time and showed grade 2 endometrial adenocarcinoma.  Given the above findings, the patient was referred to Dr. Berline Lopes and was seen in consultation on 04/25/2020. It was recommended that she proceed with surgical staging.   The patient underwent a robotic-assisted laparoscopic total hysterectomy with bilateral salpingo-oophorectomy, sentinel lymph node biopsy, and mini-lap for specimen removal on 05/01/2020 under the care of Dr. Berline Lopes. Pathology from the procedure revealed FIGO grade 2 endometrioid carcinoma of the uterus. There was also noted to be lymphovascular space involvement of more than three vessels within the myometrium, which by definition was considered extensive involvement.  The tumor was deeply invasive into the myometrium (25 of 29 mm).  no carcinoma was identified in the cervix, bilateral ovaries, a single right obturator sentinel lymph node, a single right external iliac sentinel lymph node, a single right pre-sacral sentinel lymph node, and a single left obturator sentinel lymph node.   The patient's case was discussed at the  [place] on 05/09/2020, during which time it was recommended that she be considered for  pelvic radiation given the findings of extensive lymphovascular space invasion. She is also considering participating in the [facility] GY020 research study.  She does qualify for the study based on MMR / MSI testing (Mismatch Repair Protein ([employer])  SUMMARY INTERPRETATION: ABNORMAL ) . To be a part of the study however, with the patient's grade 2 tumor she would not be a candidate for external beam radiation therapy directed at the pelvis area, but only vaginal brachytherapy.  PREVIOUS RADIATION THERAPY: No  PAST MEDICAL HISTORY:  Past Medical History:  Diagnosis Date  . Anemia   . Bradycardia    Event monitor 8/13: NSR, sinus bradycardia, sinus tachycardia, no significant bradycardia arrhythmias  . Chest pain 03/22/2012   Normal Myoview, normal echo, chest CT negative for pulmonary embolism  . History of nuclear stress test 03/2012   Myoview 03/23/12: EF 50%, no ischemia or scar.  [name] HTN (hypertension)    2-D echocardiogram 03/22/12: EF 50%, mild LAE, no wall motion abnormalities.  . Tubal pregnancy    In her late 20's  . Vitamin D deficiency     PAST SURGICAL HISTORY: Past Surgical History:  Procedure Laterality Date  . CESAREAN SECTION     x2  . ECTOPIC PREGNANCY SURGERY    . ROBOTIC ASSISTED TOTAL HYSTERECTOMY WITH BILATERAL SALPINGO OOPHERECTOMY N/A 05/01/2020   Procedure: XI ROBOTIC ASSISTED TOTAL HYSTERECTOMY WITH BILATERAL SALPINGO OOPHORECTOMY AND MINI LAPAROTOMY;  Surgeon: [name], MD;  Location: [facility];  Service: Gynecology;  Laterality: N/A;  . SENTINEL NODE BIOPSY N/A 05/01/2020   Procedure: SENTINEL NODE BIOPSY;  Surgeon: [name], MD;  Location:  [facility];  Service: Gynecology;  Laterality: N/A;  . TUBAL LIGATION      FAMILY HISTORY:  Family History  Problem Relation Age of Onset  . Diabetes type II Sister   . Hypertension Mother     . Hypertension Father   . Hypertension Sister   . Breast cancer Maternal Aunt   . Breast cancer Cousin        maternal cousin    SOCIAL HISTORY:  Social History   Tobacco Use  . Smoking status: Former Smoker    Packs/day: 0.20    Years: 25.00    Pack years: 5.00    Types: Cigarettes    Quit date: 09/29/2016    Years since quitting: 3.6  . Smokeless tobacco: Never Used  Vaping Use  . Vaping Use: Never used  Substance Use Topics  . Alcohol use: Yes    Comment: occasional  . Drug use: Yes    Types: Marijuana    Comment: occ     ALLERGIES:  Allergies  Allergen Reactions  . Lisinopril Swelling    Angioedema    MEDICATIONS:  Current Outpatient Medications  Medication Sig Dispense Refill  . acetaminophen (TYLENOL) 500 MG tablet Take 1,000 mg by mouth every 6 (six) hours as needed for mild pain.    [name] amLODipine (NORVASC) 10 MG tablet Take 10 mg by mouth daily.     [name] ibuprofen (ADVIL) 800 MG tablet Take 1 tablet (800 mg total) by mouth every 8 (eight) hours as needed for moderate pain. For AFTER surgery 30 tablet 0  . Multiple Vitamin (MULTIVITAMIN WITH MINERALS) TABS tablet Take 1 tablet by mouth daily.    [name] oxyCODONE (OXY IR/ROXICODONE) 5 MG immediate release tablet Take 1 tablet (5 mg total) by mouth every 4 (four) hours as needed for severe pain. For AFTER surgery, do not take and drive 10 tablet 0  . senna-docusate (SENOKOT-S) 8.6-50 MG tablet Take 2 tablets by mouth at bedtime. For AFTER surgery, do not take if having diarrhea 30 tablet 0  . VITAMIN D PO Take 1 tablet by mouth daily.     No current facility-administered medications for this encounter.    REVIEW OF SYSTEMS:  A 10+ POINT REVIEW OF SYSTEMS WAS OBTAINED including neurology, dermatology, psychiatry, cardiac, respiratory, lymph, extremities, GI, GU, musculoskeletal, constitutional, reproductive, HEENT.  She did experience pelvic pain prior to her surgery but no pain at this time.  She denies any urination  difficulties or bowel complaints.  She denies any numbness or swelling along the lower extremities.   PHYSICAL EXAM:  height is 5' 2"  (1.575 m) and weight is 148 lb (67.1 kg). Her oral temperature is 98.7 F (37.1 C). Her blood pressure is 164/98 (abnormal) and her pulse is 58 (abnormal). Her respiration is 16 and oxygen saturation is 100%.   General: Alert and oriented, in no acute distress HEENT: Head is normocephalic. Extraocular movements are intact. Oropharynx is clear. Neck: Neck is supple, no palpable cervical or supraclavicular lymphadenopathy. Heart: Regular in rate and rhythm with no murmurs, rubs, or gallops. Chest: Clear to auscultation bilaterally, with no rhonchi, wheezes, or rales. Abdomen: Soft, nontender, nondistended, with no rigidity or guarding.  Laparoscopy scars well-healing.  Patient also has a small scar in the peri-umbilical area from her mini lap for the procedure. Extremities: No cyanosis or edema. Lymphatics: see Neck Exam Skin: No concerning lesions. Musculoskeletal: symmetric strength and muscle tone throughout. Neurologic: Cranial nerves II through XII are grossly intact. No  obvious focalities. Speech is fluent. Coordination is intact. Psychiatric: Judgment and insight are intact. Affect is appropriate. Pelvic exam deferred in light of recent surgery.  ECOG = 1  0 - Asymptomatic (Fully active, able to carry on all predisease activities without restriction)  1 - Symptomatic but completely ambulatory (Restricted in physically strenuous activity but ambulatory and able to carry out work of a light or sedentary nature. For example, light housework, office work)  2 - Symptomatic, <50% in bed during the day (Ambulatory and capable of all self care but unable to carry out any work activities. Up and about more than 50% of waking hours)  3 - Symptomatic, >50% in bed, but not bedbound (Capable of only limited self-care, confined to bed or chair 50% or more of waking  hours)  4 - Bedbound (Completely disabled. Cannot carry on any self-care. Totally confined to bed or chair)  5 - Death   Eustace Pen MM, Creech RH, Tormey DC, et al. [phone]). "Toxicity and response criteria of the [facility]". [place] Oncol. 5 (6): 649-55  LABORATORY DATA:  Lab Results  Component Value Date   WBC 9.8 04/28/2020   HGB 13.2 04/28/2020   HCT 39.7 04/28/2020   MCV 84.6 04/28/2020   PLT 288 04/28/2020   NEUTROABS 5,469 04/26/2017   Lab Results  Component Value Date   NA 141 04/28/2020   K 3.9 04/28/2020   CL 104 04/28/2020   CO2 26 04/28/2020   GLUCOSE 93 04/28/2020   CREATININE 0.64 04/28/2020   CALCIUM 10.2 04/28/2020      RADIOGRAPHY: No results found.    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  We discussed potential consideration for whole pelvic radiation therapy given the findings of extensive lymphovascular space invasion.  We also discussed potential participation in the [facility] study with the understanding that she would receive vaginal brachytherapy alone , if enrolled in the study.  The patient seems to be interested in the study and will meet with the research nurse later today for further discussion.  Today, I talked to the patient and husband about the findings and work-up thus far.  We discussed the natural history of endometrial cancer and general treatment, highlighting the role of radiotherapy in the management.  We discussed the available radiation techniques, and focused on the details of logistics and delivery.  We reviewed the anticipated acute and late sequelae associated with radiation in this setting.  The patient was encouraged to ask questions that I answered to the best of my ability.    PLAN: Final treatment plans are pending at this time.  She agrees to adjuvant radiation therapy but unsure at this point whether she will proceed with the [place] study.  Total time spent in this encounter was 65 minutes which included reviewing the patient's most recent consultations, follow-ups, endometrial biopsy, hysterectomy with biopsies, pathology reports, physical examination, and documentation and discussion of NRG study.   ------------------------------------------------  Blair Promise, PhD, MD  This document serves as a record of services personally performed by Gery Pray, MD. It was created on his behalf by Clerance Lav, a trained medical scribe. The creation of this record is based on the scribe's personal observations and the provider's statements to them. This document has been  checked and approved by the attending provider.

## 2020-05-19 ENCOUNTER — Encounter: Payer: Self-pay | Admitting: Radiation Oncology

## 2020-05-19 ENCOUNTER — Encounter (HOSPITAL_COMMUNITY): Payer: Self-pay | Admitting: Gynecologic Oncology

## 2020-05-19 ENCOUNTER — Ambulatory Visit
Admission: RE | Admit: 2020-05-19 | Discharge: 2020-05-19 | Disposition: A | Discharge status: Home / Self Care | Payer: 59 | Source: Ambulatory Visit | Attending: Radiation Oncology | Admitting: Radiation Oncology

## 2020-05-19 ENCOUNTER — Encounter: Payer: Self-pay | Admitting: *Deleted

## 2020-05-19 ENCOUNTER — Other Ambulatory Visit: Payer: Self-pay

## 2020-05-19 DIAGNOSIS — D649 Anemia, unspecified: Secondary | ICD-10-CM | POA: Diagnosis not present

## 2020-05-19 DIAGNOSIS — C541 Malignant neoplasm of endometrium: Secondary | ICD-10-CM | POA: Diagnosis present

## 2020-05-19 DIAGNOSIS — Z90710 Acquired absence of both cervix and uterus: Secondary | ICD-10-CM | POA: Diagnosis not present

## 2020-05-19 DIAGNOSIS — Z90722 Acquired absence of ovaries, bilateral: Secondary | ICD-10-CM | POA: Insufficient documentation

## 2020-05-19 DIAGNOSIS — R000 Tachycardia, unspecified: Secondary | ICD-10-CM | POA: Insufficient documentation

## 2020-05-19 DIAGNOSIS — I10 Essential (primary) hypertension: Secondary | ICD-10-CM | POA: Insufficient documentation

## 2020-05-19 DIAGNOSIS — E559 Vitamin D deficiency, unspecified: Secondary | ICD-10-CM | POA: Insufficient documentation

## 2020-05-19 DIAGNOSIS — Z803 Family history of malignant neoplasm of breast: Secondary | ICD-10-CM | POA: Insufficient documentation

## 2020-05-19 DIAGNOSIS — Z87891 Personal history of nicotine dependence: Secondary | ICD-10-CM | POA: Insufficient documentation

## 2020-05-19 DIAGNOSIS — Z79899 Other long term (current) drug therapy: Secondary | ICD-10-CM | POA: Insufficient documentation

## 2020-05-19 DIAGNOSIS — E119 Type 2 diabetes mellitus without complications: Secondary | ICD-10-CM | POA: Diagnosis not present

## 2020-05-19 NOTE — Research (Signed)
NRG-GY020 Clinical Trial;  Met with patient and her husband in private exam room for 15 minutes to present the NRG-GY020 study information.  Informed patient participation is completely voluntary. Provided copy of current consent and hippa forms for patient review.  Briefly outlined the purpose, design and possible risks and benefits of this study. Informed patient of the randomization process and both arms of the study. Informed patient all study assessments, including office visits and required CT scans are billed to patients insurance and she would be responsible for any co-pays or portions not covered by insurance.  The study pays for the pembrolizumab if she is randomized to that arm of the study and all other costs billed to her insurance.  Informed patient that she would be having clinic visits every 6 weeks if she in pembrolizumab arm for the first year and every 3 months if she is in the observation arm. This is more visits than she would be having off study.  She would also be referred to medical oncologist, Dr. Alvy Bimler, to manage the study visits and possible treatment with pembrolizumab.  Patient denied any questions at this time and agreed to research nurse calling her in one week to follow up.  Gave patient a clinical research informational brochure along with my business card. Encouraged her to call or email if any questions before I call next week. She verbalized understanding. Thanked patient for her time and willingness to consider this study.  Foye Spurling, BSN, RN Clinical Research Nurse 05/19/2020 10:07 AM

## 2020-05-26 ENCOUNTER — Telehealth: Payer: Self-pay | Admitting: *Deleted

## 2020-05-26 NOTE — Telephone Encounter (Signed)
Attempted to call patient to see if she has any questions about the clinical trial [id number]. Cell phone number is unavailable and not connecting at this time. Will try again tomorrow and if unable to reach by cell phone, will try work number.  Foye Spurling, BSN, RN Clinical Research Nurse 05/26/2020 3:06 PM

## 2020-05-27 ENCOUNTER — Telehealth: Payer: Self-pay | Admitting: *Deleted

## 2020-05-27 NOTE — Telephone Encounter (Signed)
[id number]; Unable to reach patient yesterday or today to follow up on the [place] study to see if patient has any questions. Research nurse will plan to follow up in clinic later this week after patient sees Dr. Berline Lopes.  Patient was given research nurse number and contact information last week to call if she had any questions prior to next contact.  Foye Spurling, BSN, RN Clinical Research Nurse 05/27/2020 3:47 PM

## 2020-05-29 ENCOUNTER — Encounter: Payer: Self-pay | Admitting: Gynecologic Oncology

## 2020-05-29 ENCOUNTER — Inpatient Hospital Stay: Payer: 59 | Attending: Gynecologic Oncology | Admitting: Gynecologic Oncology

## 2020-05-29 ENCOUNTER — Encounter: Payer: Self-pay | Admitting: Oncology

## 2020-05-29 ENCOUNTER — Encounter: Payer: Self-pay | Admitting: *Deleted

## 2020-05-29 ENCOUNTER — Other Ambulatory Visit: Payer: Self-pay

## 2020-05-29 VITALS — BP 149/88 | HR 56 | Temp 98.0°F | Resp 18 | Wt 150.8 lb

## 2020-05-29 DIAGNOSIS — C541 Malignant neoplasm of endometrium: Secondary | ICD-10-CM

## 2020-05-29 DIAGNOSIS — Z79899 Other long term (current) drug therapy: Secondary | ICD-10-CM | POA: Insufficient documentation

## 2020-05-29 DIAGNOSIS — Z90710 Acquired absence of both cervix and uterus: Secondary | ICD-10-CM

## 2020-05-29 DIAGNOSIS — Z90722 Acquired absence of ovaries, bilateral: Secondary | ICD-10-CM

## 2020-05-29 NOTE — Progress Notes (Signed)
Gynecologic Oncology Return Clinic Visit  05/29/2020  Reason for Visit: Postoperative follow-up, treatment discussion and planning  Treatment History: Oncology History Overview Note  MMR IHC - loss of MSH2 and 6 expression MSI - H   Endometrial cancer ([place])   Initial Diagnosis   Endometrial cancer ([place])   04/17/2020 Initial Biopsy   EMB: Grade 2 endometrioid adenocarcinoma    05/01/2020 Surgery   Robotic-assisted laparoscopic total hysterectomy with bilateral salpingoophorectomy, SLN biopsy, mini-lap for specimen removal  On EUA, 10cm mobile uterus. On intra-abdominal entry, normal upper abdominal survey. Normal small and large bowel. Omentum adherent to the anterior abdominal wall. Sigmoid epiploica adherent to the posterior uterus. Uterus 10-12cm, bulbous fundus. Adnexa adherent to posterior uterus, otherwise normal appearing. Mapping successful to right external and right obturator SLN, left obturator SLN, right presacral. All lymph nodes prominent.  No intra-abdominal or pelvic evidence of disease otherwise.  Some adhesions between the bladder and lower uterine segment/cervix, consistent with patient's prior C-section history.   05/01/2020 Pathology Results   A. UTERUS, CERVIX, BILATERAL TUBES AND OVARIES, RESECTION:   Uterus:  -  Endometrioid carcinoma, FIGO grade 2  -  See oncology table and comment below   Cervix:  -  No carcinoma identified   Bilateral Ovaries:  -  No carcinoma identified   B. LYMPH NODE, SENTINEL, RIGHT OBTURATOR, EXCISION:  -  No carcinoma identified in one lymph node (0/1)  -  See comment   C. LYMPH NODE, SENTINEL, RIGHT EXTERNAL ILIAC, EXCISION:  -  No carcinoma identified in one lymph node (0/1)  -  See comment   D. LYMPH NODE, SENTINEL, RIGHT PRE-SACRAL, EXCISION:  -  No carcinoma identified in one lymph node (0/1)  -  See comment   E. LYMPH NODE, SENTINEL, LEFT OBTURATOR, EXCISION:  -  No carcinoma identified in one lymph node (0/1)  -   See comment URGICAL PATHOLOGY    05/01/2020 Cancer Staging   Staging form: Corpus Uteri - Carcinoma and Carcinosarcoma, AJCC 8th Edition - Clinical stage from 05/01/2020: FIGO Stage IB (cT1b, cN0(sn), cM0) - Signed by [name], MD on 05/08/2020     Interval History: Patient reports she has been doing well since her phone call.  She started with some constipation immediately after surgery but that has improved now and the abdominal pain she was experiencing is minimal.  She denies any difficulty with urination.  She reports having a good appetite without nausea or emesis.  She denies any fevers or chills.  She denies vaginal bleeding or discharge.  Past Medical/Surgical History: Past Medical History:  Diagnosis Date  . Anemia   . Bradycardia    Event monitor 8/13: NSR, sinus bradycardia, sinus tachycardia, no significant bradycardia arrhythmias  . Chest pain 03/22/2012   Normal Myoview, normal echo, chest CT negative for pulmonary embolism  . History of nuclear stress test 03/2012   Myoview 03/23/12: EF 50%, no ischemia or scar.  [name] HTN (hypertension)    2-D echocardiogram 03/22/12: EF 50%, mild LAE, no wall motion abnormalities.  . Tubal pregnancy    In her late 20's  . Vitamin D deficiency     Past Surgical History:  Procedure Laterality Date  . CESAREAN SECTION     x2  . ECTOPIC PREGNANCY SURGERY    . ROBOTIC ASSISTED TOTAL HYSTERECTOMY WITH BILATERAL SALPINGO OOPHERECTOMY N/A 05/01/2020   Procedure: XI ROBOTIC ASSISTED TOTAL HYSTERECTOMY WITH BILATERAL SALPINGO OOPHORECTOMY AND MINI LAPAROTOMY;  Surgeon: [name], MD;  Location: [facility];  Service: Gynecology;  Laterality: N/A;  . SENTINEL NODE BIOPSY N/A 05/01/2020   Procedure: SENTINEL NODE BIOPSY;  Surgeon: [name], MD;  Location: [facility];  Service: Gynecology;  Laterality: N/A;  . TUBAL LIGATION      Family History  Problem Relation Age of Onset  . Diabetes type II Sister   . Hypertension Mother    . Hypertension Father   . Hypertension Sister   . Breast cancer Maternal Aunt   . Breast cancer Cousin        maternal cousin    Social History   Socioeconomic History  . Marital status: Married    Spouse name: Not on file  . Number of children: 2  . Years of education: Not on file  . Highest education level: Not on file  Occupational History  . Occupation: packer/driver    Comment: [employer] packing  Tobacco Use  . Smoking status: Former Smoker    Packs/day: 0.20    Years: 25.00    Pack years: 5.00    Types: Cigarettes    Quit date: 09/29/2016    Years since quitting: 3.6  . Smokeless tobacco: Never Used  Vaping Use  . Vaping Use: Never used  Substance and Sexual Activity  . Alcohol use: Yes    Comment: occasional  . Drug use: Yes    Types: Marijuana    Comment: occ   . Sexual activity: Yes  Other Topics Concern  . Not on file  Social History Narrative  . Not on file   Social Determinants of Health   Financial Resource Strain:   . Difficulty of Paying Living Expenses: Not on file  Food Insecurity:   . Worried About Charity fundraiser in the Last Year: Not on file  . Ran Out of Food in the Last Year: Not on file  Transportation Needs:   . Lack of Transportation (Medical): Not on file  . Lack of Transportation (Non-Medical): Not on file  Physical Activity:   . Days of Exercise per Week: Not on file  . Minutes of Exercise per Session: Not on file  Stress:   . Feeling of Stress : Not on file  Social Connections:   . Frequency of Communication with Friends and Family: Not on file  . Frequency of Social Gatherings with Friends and Family: Not on file  . Attends Religious Services: Not on file  . Active Member of Clubs or Organizations: Not on file  . Attends Archivist Meetings: Not on file  . Marital Status: Not on file    Current Medications:  Current Outpatient Medications:  .  acetaminophen (TYLENOL) 500 MG tablet, Take 1,000 mg by  mouth every 6 (six) hours as needed for mild pain., Disp: , Rfl:  .  amLODipine (NORVASC) 10 MG tablet, Take 10 mg by mouth daily. , Disp: , Rfl:  .  ibuprofen (ADVIL) 800 MG tablet, Take 1 tablet (800 mg total) by mouth every 8 (eight) hours as needed for moderate pain. For AFTER surgery, Disp: 30 tablet, Rfl: 0 .  Multiple Vitamin (MULTIVITAMIN WITH MINERALS) TABS tablet, Take 1 tablet by mouth daily., Disp: , Rfl:  .  senna-docusate (SENOKOT-S) 8.6-50 MG tablet, Take 2 tablets by mouth at bedtime. For AFTER surgery, do not take if having diarrhea, Disp: 30 tablet, Rfl: 0 .  VITAMIN D PO, Take 1 tablet by mouth daily., Disp: , Rfl:   Review of Systems: +  Hot flashes, pelvic pain. Denies appetite changes, fevers, chills, fatigue, unexplained weight changes. Denies hearing loss, neck lumps or masses, mouth sores, ringing in ears or voice changes. Denies cough or wheezing.  Denies shortness of breath. Denies chest pain or palpitations. Denies leg swelling. Denies abdominal distention, pain, blood in stools, constipation, diarrhea, nausea, vomiting, or early satiety. Denies pain with intercourse, dysuria, frequency, hematuria or incontinence. Denies  vaginal bleeding or vaginal discharge.   Denies joint pain, back pain or muscle pain/cramps. Denies itching, rash, or wounds. Denies dizziness, headaches, numbness or seizures. Denies swollen lymph nodes or glands, denies easy bruising or bleeding. Denies anxiety, depression, confusion, or decreased concentration.  Physical Exam: BP (!) 149/88 (BP Location: Left Arm, Patient Position: Sitting)   Pulse (!) 56   Temp 98 F (36.7 C) (Tympanic)   Resp 18   Wt 150 lb 12.8 oz (68.4 kg)   SpO2 100%   BMI 27.58 kg/m  General: Alert, oriented, no acute distress. HEENT: Normocephalic, atraumatic, sclera anicteric. Chest: Unlabored breathing on room air. Abdomen: soft, nontender.  Normoactive bowel sounds.  No masses or hepatosplenomegaly  appreciated.  Well-healing incisions, Dermabond removed today. Extremities: Grossly normal range of motion.  Warm, well perfused.  No edema bilaterally. Skin: No rashes or lesions noted. GU: Normal appearing external genitalia without erythema, excoriation, or lesions.  Speculum exam reveals well-healing vaginal cuff, no bleeding or discharge, sutures visible.  Bimanual exam reveals cuff intact, no fluctuance or tenderness.    Laboratory & Radiologic Studies: None new  Assessment & Plan: Christy Waller is a 49 y.o. woman with Stage IB grade 2 endometrial cancer who presents for postoperative follow-up and discussion of treatment planning.  The patient is doing very well from a postoperative standpoint and is meeting all milestones.  We discussed continued activity restrictions until she is 6 weeks out from surgery and nothing in the vagina until 8 weeks.  She has seen radiation oncology and we have discussed her participation in [place].  The patient is quite interested in participation.  She understands that she will get vaginal brachytherapy in the study and be randomized either to the addition of pembrolizumab or placebo.  Given MSI high results from her tumor testing, we will plan to refer to genetics for genetic testing.  Patient aware.  In terms of surveillance, given her intermediate risk disease, we discussed plan for surveillance visits every 3 months.  If in the study, this will be either with radiation oncology only or alternating between radiation and medical oncology per study protocol.  25 minutes of total time was spent for this patient encounter, including preparation, face-to-face counseling with the patient and coordination of care, and documentation of the encounter.  Jeral Pinch, MD  Division of Gynecologic Oncology  Department of Obstetrics and Gynecology  [facility]

## 2020-05-29 NOTE — Research (Signed)
NRG [id number]- A PHASE III RANDOMIZED TRIAL OF RADIATION +/- [id number]: Met with patient alone in private exam room for 40 minutes to review and consent for the above clinical trial.  Patient states she read the consent form at home and has discussed with Dr. Berline Lopes today.  Patient would like to enroll on this study.  Provided patient with another copy of the ICF for [facility] 01/03/20 and reviewed with patient in it's entirety .  Reminded patient that participation is completely voluntary and she may withdraw consent at any time. The purpose of the study along with possible benefits and risks were reviewed. Discussed potential side effects of pembrolizumab along with radiation.  Planned radiation is vaginal brachytherapy per Dr. Berline Lopes.  Study required assessments beyond standard of care were outlined. Patient understands she will have more clinic visits than usual on study than if she were not on study.  Patient also understands that CT of chest, abdomen and pelvis is being ordered for study purposes.  Patient understands that the study does not pay for any of these assessments and all are billed to her insurance. The study does pay for the pembrolizumab if patient randomized to that arm of the study.  But it does not pay for the costs associated with administering the pembrolizumab.  Patient verbalized understanding. Discussed the randomization process and 50% chance that patient will be assigned to the treatment arm with radiation and pembrolizumab and 50% that she will be assigned to the observation arm with radiation alone which is current standard of care. Patient denied any questions and agreed to enroll on this study. She circled "yes" to participate in the optional sample collection and to be contacted in the future for other studies. Patient then signed and dated the ICF for the main study.  Patient did not want to wait for copy of her signed ICF today and states she will get on her next visit.   Informed patient  that CT and Lab will be ordered and must be completed prior to registering patient to the study.  Will informe Dr. Sondra Come when CT is scheduled to plan for start of radiation. Informed patient research nurse will keep her notified of any updates. Gave patient another business card to call if any questions prior to our next contact. Thanked patient for her participation in this study. She verbalized understanding.  Foye Spurling, BSN, RN Clinical Research Nurse 05/29/2020 2:58 PM

## 2020-05-29 NOTE — Progress Notes (Signed)
[place] Payment with lab, CT scan and instructions on 06/10/20 and appointment with Dr. Alvy Bimler on 06/11/20.  She verbalized understanding and agreement of appointments and instructions.

## 2020-05-29 NOTE — Patient Instructions (Addendum)
You are healing great from surgery!  I have placed a referral for you to see our genetic counselors to discuss genetic testing.  They should call you in the next week.

## 2020-06-02 ENCOUNTER — Telehealth: Payer: Self-pay | Admitting: *Deleted

## 2020-06-02 NOTE — Telephone Encounter (Signed)
Called and scheduled the patient for a genetics appt on 11/1

## 2020-06-09 ENCOUNTER — Telehealth: Payer: Self-pay | Admitting: *Deleted

## 2020-06-09 NOTE — Telephone Encounter (Signed)
Called patient to confirm her appointments for lab and CT scan tomorrow. Informed patient that there is a study Hippa form to review and sign before her appointments.  I forgot to get patient to sign the Hippa after her consent form.  Patient agreed to come in early before lab appointment to review and sign the study hippa form. Thanked patient for her time and will see her tomorrow.  Foye Spurling, BSN, RN Clinical Research Nurse 06/09/2020 11:40 AM

## 2020-06-10 ENCOUNTER — Other Ambulatory Visit: Payer: Self-pay

## 2020-06-10 ENCOUNTER — Ambulatory Visit (HOSPITAL_COMMUNITY)
Admission: RE | Admit: 2020-06-10 | Discharge: 2020-06-10 | Disposition: A | Discharge status: Home / Self Care | Payer: 59 | Source: Ambulatory Visit | Attending: Gynecologic Oncology | Admitting: Gynecologic Oncology

## 2020-06-10 ENCOUNTER — Inpatient Hospital Stay: Payer: 59

## 2020-06-10 DIAGNOSIS — C541 Malignant neoplasm of endometrium: Secondary | ICD-10-CM

## 2020-06-10 DIAGNOSIS — Z006 Encounter for examination for normal comparison and control in clinical research program: Secondary | ICD-10-CM | POA: Insufficient documentation

## 2020-06-10 DIAGNOSIS — Z79899 Other long term (current) drug therapy: Secondary | ICD-10-CM

## 2020-06-10 LAB — CBC WITH DIFFERENTIAL (CANCER CENTER ONLY)
Abs Immature Granulocytes: 0.01 10*3/uL (ref 0.00–0.07)
Basophils Absolute: 0 10*3/uL (ref 0.0–0.1)
Basophils Relative: 1 %
Eosinophils Absolute: 0.2 10*3/uL (ref 0.0–0.5)
Eosinophils Relative: 2 %
HCT: 37.8 % (ref 36.0–46.0)
Hemoglobin: 12.7 g/dL (ref 12.0–15.0)
Immature Granulocytes: 0 %
Lymphocytes Relative: 33 %
Lymphs Abs: 2.1 10*3/uL (ref 0.7–4.0)
MCH: 28.3 pg (ref 26.0–34.0)
MCHC: 33.6 g/dL (ref 30.0–36.0)
MCV: 84.4 fL (ref 80.0–100.0)
Monocytes Absolute: 0.5 10*3/uL (ref 0.1–1.0)
Monocytes Relative: 7 %
Neutro Abs: 3.6 10*3/uL (ref 1.7–7.7)
Neutrophils Relative %: 57 %
Platelet Count: 232 10*3/uL (ref 150–400)
RBC: 4.48 MIL/uL (ref 3.87–5.11)
RDW: 15.4 % (ref 11.5–15.5)
WBC Count: 6.4 10*3/uL (ref 4.0–10.5)
nRBC: 0 % (ref 0.0–0.2)

## 2020-06-10 LAB — CMP (CANCER CENTER ONLY)
ALT: 12 U/L (ref 0–44)
AST: 17 U/L (ref 15–41)
Albumin: 4.2 g/dL (ref 3.5–5.0)
Alkaline Phosphatase: 96 U/L (ref 38–126)
Anion gap: 7 (ref 5–15)
BUN: 14 mg/dL (ref 6–20)
CO2: 29 mmol/L (ref 22–32)
Calcium: 10.2 mg/dL (ref 8.9–10.3)
Chloride: 104 mmol/L (ref 98–111)
Creatinine: 0.77 mg/dL (ref 0.44–1.00)
GFR, Estimated: >60 mL/min (ref >60)
Glucose, Bld: 85 mg/dL (ref 70–99)
Potassium: 4 mmol/L (ref 3.5–5.1)
Sodium: 140 mmol/L (ref 135–145)
Total Bilirubin: 0.5 mg/dL (ref 0.3–1.2)
Total Protein: 7.8 g/dL (ref 6.5–8.1)

## 2020-06-10 LAB — TSH: TSH: 1.14 u[IU]/mL (ref 0.308–3.960)

## 2020-06-10 MED ORDER — IOHEXOL 300 MG/ML  SOLN
100.0000 mL | Freq: Once | INTRAMUSCULAR | Status: AC | PRN
  Administered 2020-06-10: 100 mL via INTRAVENOUS

## 2020-06-11 ENCOUNTER — Inpatient Hospital Stay: Payer: 59 | Admitting: Hematology and Oncology

## 2020-06-11 ENCOUNTER — Telehealth: Payer: Self-pay | Admitting: *Deleted

## 2020-06-11 NOTE — Telephone Encounter (Addendum)
Christy Waller;  06/10/20- Patient was in clinic to complete screening assessments for the study including labs and CT scan.   HIPPA- Met with patient prior to her appointments to review the hippa form for the study. Patient signed the ICF on 05/29/20 but the hippa was missed. Spent 10 minutes with patient to review the hippa form for NRG-Waller dated 09/22/2018 with patient in it's entirety.  Patient denied any questions and signed and dated the form.  Concomitant Medications- Reviewed patient's medications and she denies taking any live vaccines or systemic glucocorticoids in the past 30 days. Instructed patient these are prohibited on study and instructed to please contact research nurse before taking any new medications. She verbalized understanding.  Medical History- Reviewed patient's medical history and she denies any autoimmune diseases or any other diseases or surgeries not currently listed in the EMR. Patient denies any cardiac symptoms including chest pains since 2013. She reports cardiac work up did not show any abnormalities and she has not had any symptoms since that time. Patient has no known liver disease. Patient states she feels fully recovered from surgery done on 05/01/20. ECOG- Patient feels back to her baseline functioning.  Nurse assessed ECOG as "0" based on no symptoms and able to carry on all activities without any restrictions.  Labs- Patient then proceeded to have the screening labs for study including CBC, CMET and TSH drawn.   Imaging- Research nurse then walked patient to radiology for her CT scan.   Informed patient that research nurse will call her the following morning after she has been randomized to the study to let her know which arm of the study she was assigned. Patient verbalized understanding.  06/11/20- Eligibility checklist reviewed by Dr. Sondra Come along with results of labs and CT scan done yesterday. Dr. Sondra Come agrees patient meets all eligibility criteria and none of the  exclusion criteria to be randomized on this study. Eligibility review also completed by second research RN, Otilio Miu, who agrees patient is eligible to enroll on this study.  Patient was randomized to the study via OPEN.  The registration form required the planned radiation to be entered as "IMRT" although the planned radiation is Brachytherapy.  There was no other options and answer could not be left blank to continue registration. Emailed study Environmental consultant, Stefano Gaul, who advised to to fill in the field as IMRT and later enter the planned therapy of brachytherapy under the Eligibility section. Patient was randomized to ARM 1, RT Alone.  Dr. Alvy Bimler and Dr. Sondra Come notified.  Called patient and notified she was assigned to study arm 1 with RT alone without immunotherapy. Informed patient she will not need to see Dr. Alvy Bimler today as scheduled and that appointment will be canceled.  She will follow with Dr. Sondra Come for radiation treatment. Informed patient Dr. Sondra Come will schedule her to start radiation within the next 2 weeks.  I will call her with any updates. Meanwhile patient is scheduled to see genetic counselor on 06/16/20.  Instructed patient to keep that appointment as scheduled. It is not related to the study.  Patient verbalized understanding and denied any questions at this time.  Encouraged patient to call research if any questions prior to next contact. She verbalized understanding.  Foye Spurling, BSN, RN Clinical Research Nurse 06/11/2020 3:46 PM

## 2020-06-13 ENCOUNTER — Telehealth: Payer: Self-pay | Admitting: *Deleted

## 2020-06-13 NOTE — Telephone Encounter (Signed)
[place] [id number]; Called patient to see if she has time on Monday 11/01 either before or after her Genetic Counseling appointment to complete the study questionnaires.  Reminded patient that the study has an app to download on her phone to complete questionnaires from her phone. Patient verbalized understanding and agreed to meet with research nurse on Monday after her genetics appointment.  Foye Spurling, BSN, RN Clinical Research Nurse 06/13/2020 11:01 AM

## 2020-06-16 ENCOUNTER — Other Ambulatory Visit: Payer: Self-pay | Admitting: *Deleted

## 2020-06-16 ENCOUNTER — Encounter: Payer: Self-pay | Admitting: *Deleted

## 2020-06-16 ENCOUNTER — Other Ambulatory Visit: Payer: Self-pay | Admitting: Genetic Counselor

## 2020-06-16 ENCOUNTER — Inpatient Hospital Stay: Payer: 59

## 2020-06-16 ENCOUNTER — Inpatient Hospital Stay: Payer: 59 | Attending: Gynecologic Oncology | Admitting: Genetic Counselor

## 2020-06-16 ENCOUNTER — Other Ambulatory Visit: Payer: Self-pay

## 2020-06-16 DIAGNOSIS — C541 Malignant neoplasm of endometrium: Secondary | ICD-10-CM | POA: Insufficient documentation

## 2020-06-16 DIAGNOSIS — Z803 Family history of malignant neoplasm of breast: Secondary | ICD-10-CM

## 2020-06-16 LAB — GENETIC SCREENING ORDER

## 2020-06-16 NOTE — Research (Signed)
[id number]; Met with patient after her genetic counseling appointment/lab for aprox 30 minutes.  Met in private conference room and reviewed current medications and medical history.  Reminded patient of prohibited medications on study including live vaccines and steroids. Reminded patient to notify research nurse before starting any new prescriptions while on study, if possible (if not urgent or emergency use). Patient verbalized understanding. Reviewed medical history with patient and she reports iron deficiency anemia, vitamin D deficiency and hypertension all diagnosed same year in 2013.  Patient states HTN and Vitamin D deficiency ongoing and managed with medication. Patient states anemia has not returned since 2013.  Gave patient copies of her signed ICF and Hippa forms for her records.  Instructed patient on downloading the Patient Cloud app on her IPhone to complete baseline questionnaires.  Patient was unable to download the app during our visit because she did not remember her Apple ID.  We logged onto the patient cloud registration online via desk top computer and patient was able to register to patient cloud online. Patient created a password and PIN number.  Patient states she will ask her husband if he knows the apple ID and try again to download the app to her phone from home.  Informed patient that the questionnaires need to be completed prior to her first radiation treatment and research nurse will follow up with patient tomorrow.  Informed patient that radiation start date has not been scheduled yet.  Research nurse will update patient as soon as it is scheduled. Gave patient my business card again with phone and e-mail to contact for any questions or concerns prior to our next contact. Thanked patient very much for her time today.  Foye Spurling, BSN, RN Clinical Research Nurse 06/16/2020

## 2020-06-16 NOTE — Research (Signed)
DCP-001 Use of a Clinical Trial Screening Tool to Address Cancer Health Disparities in the [place]: Informed patient of the DCP-001 data collection study. Informed patient that participation is voluntary and involves a one time consent and collection of demographic variables, with the majority of data collected from their medical record. Informed patient of the purpose of the study and that it would take about 15 to 20 minutes of her time today to review consent/ hippa forms and answer questions.  Patient opted to not participate in this study stating she would rather not share more information than she already has for the main study she is currently enrolled. Thanked patient for her time to listen about this study today.  Foye Spurling, BSN, RN Clinical Research Nurse 06/16/2020

## 2020-06-17 ENCOUNTER — Encounter: Payer: Self-pay | Admitting: Genetic Counselor

## 2020-06-17 DIAGNOSIS — Z803 Family history of malignant neoplasm of breast: Secondary | ICD-10-CM

## 2020-06-17 HISTORY — DX: Family history of malignant neoplasm of breast: Z80.3

## 2020-06-17 NOTE — Progress Notes (Signed)
REFERRING PROVIDER: [name], MD [address],  [place] [id number]  PRIMARY PROVIDER:  [facility] [place]  PRIMARY REASON FOR VISIT:  1. Endometrial cancer (Christy Waller)   2. Family history of breast cancer    HISTORY OF PRESENT ILLNESS:   [name], a 49 y.o. female, was seen for a Christy Waller cancer genetics consultation at the request of Christy Waller due to a personal history of endometrial cancer.  [name] presents to clinic today to discuss the possibility of a hereditary predisposition to cancer, to discuss genetic testing, and to further clarify her future cancer risks, as well as potential cancer risks for family members.   In September 2021, at the age of 55, [name] was diagnosed with endometrial cancer with loss of MSH2 and MSH6 by immunohistochemistry and microsatellite instability-high.  She had a robotic-assisted laproscopic total hysterectomy with bilateral salpingo-ophorectomy.   CANCER HISTORY:  Oncology History Overview Note  MMR IHC - loss of MSH2 and 6 expression MSI - High Genetics appt pending   Endometrial cancer (Christy Waller)   Initial Diagnosis   Endometrial cancer (Christy Waller)   04/17/2020 Initial Biopsy   EMB: Grade 2 endometrioid adenocarcinoma    05/01/2020 Surgery   Robotic-assisted laparoscopic total hysterectomy with bilateral salpingoophorectomy, SLN biopsy, mini-lap for specimen removal  On EUA, 10cm mobile uterus. On intra-abdominal entry, normal upper abdominal survey. Normal small and large bowel. Omentum adherent to the anterior abdominal wall. Sigmoid epiploica adherent to the posterior uterus. Uterus 10-12cm, bulbous fundus. Adnexa adherent to posterior uterus, otherwise normal appearing. Mapping successful to right external and right obturator SLN, left obturator SLN, right presacral. All lymph nodes prominent.  No intra-abdominal or pelvic evidence of disease otherwise.  Some adhesions between the bladder and  lower uterine segment/cervix, consistent with patient's prior C-section history.   05/01/2020 Pathology Results   A. UTERUS, CERVIX, BILATERAL TUBES AND OVARIES, RESECTION:   Uterus:  -  Endometrioid carcinoma, FIGO grade 2  -  See oncology table and comment below   Cervix:  -  No carcinoma identified   Bilateral Ovaries:  -  No carcinoma identified   B. LYMPH NODE, SENTINEL, RIGHT OBTURATOR, EXCISION:  -  No carcinoma identified in one lymph node (0/1)  -  See comment   C. LYMPH NODE, SENTINEL, RIGHT EXTERNAL ILIAC, EXCISION:  -  No carcinoma identified in one lymph node (0/1)  -  See comment   D. LYMPH NODE, SENTINEL, RIGHT PRE-SACRAL, EXCISION:  -  No carcinoma identified in one lymph node (0/1)  -  See comment   E. LYMPH NODE, SENTINEL, LEFT OBTURATOR, EXCISION:  -  No carcinoma identified in one lymph node (0/1)  -  See comment URGICAL PATHOLOGY    05/01/2020 Cancer Staging   Staging form: Corpus Uteri - Carcinoma and Carcinosarcoma, AJCC 8th Edition - Clinical stage from 05/01/2020: FIGO Stage IB (cT1b, cN0(sn), cM0) - Signed by Christy Mosses, MD on 05/08/2020   06/11/2020 Imaging   1. No findings of recurrent malignancy. 2. There are a few scattered small subcutaneous nodules primarily along the upper abdominal back, probably small foci of subcutaneous inflammation or small subcutaneous lymph nodes. 3. Peripheral enhancement in the dome of segment 7 of the liver, possibly a flash filling hemangioma but technically nonspecific. This could be further characterized with hepatic protocol MRI with and without contrast if clinically warranted. 4. 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 There is also some sclerosis in the left T10 and T11 pedicle, mildly increased from 2013, and probably from costovertebral arthropathy. 5. Dextroconvex thoracic scoliosis. 6. Mild  cardiomegaly. 7. Facet arthropathy in the lumbar spine especially on the left at L4-5.    RISK FACTORS:  Menarche was at age 87.  First live birth at age 34.  OCP use for approximately 1 years.  Ovaries intact: no.  Hysterectomy: yes.  Menopausal status: perimenopausal.  HRT use: 0 years. Colonoscopy: yes; most recent in 2014; one hyperplastic polyp detected; return in 10 years. Mammogram within the last year: 5 years ago. Number of breast biopsies: 0. Any excessive radiation exposure in the past: no  Past Medical History:  Diagnosis Date  . Anemia 2013   Iron Deficiency Anemia  . Bradycardia 03/2012   Event monitor 8/13: NSR, sinus bradycardia, sinus tachycardia, no significant bradycardia arrhythmias  . Chest pain 03/22/2012   Normal Myoview, normal echo, chest CT negative for pulmonary embolism  . Family history of breast cancer 06/17/2020  . History of nuclear stress test 03/2012   Myoview 03/23/12: EF 50%, no ischemia or scar.  [name] HTN (hypertension) 03/2012   2-D echocardiogram 03/22/12: EF 50%, mild LAE, no wall motion abnormalities.  . Tubal pregnancy aprox 1998   In her late 67s  . Vitamin D deficiency 2013    Past Surgical History:  Procedure Laterality Date  . CESAREAN SECTION     x2  . ECTOPIC PREGNANCY SURGERY    . ROBOTIC ASSISTED TOTAL HYSTERECTOMY WITH BILATERAL SALPINGO OOPHERECTOMY N/A 05/01/2020   Procedure: XI ROBOTIC ASSISTED TOTAL HYSTERECTOMY WITH BILATERAL SALPINGO OOPHORECTOMY AND MINI LAPAROTOMY;  Surgeon: Christy Mosses, MD;  Location: [facility];  Service: Gynecology;  Laterality: N/A;  . SENTINEL NODE BIOPSY N/A 05/01/2020   Procedure: SENTINEL NODE BIOPSY;  Surgeon: Christy Mosses, MD;  Location: [facility];  Service: Gynecology;  Laterality: N/A;  . TUBAL LIGATION      Social History   Socioeconomic History  . Marital status: Married    Spouse name: Not on file  . Number of children: 2  . Years of education: Not on file  . Highest education  level: Not on file  Occupational History  . Occupation: packer/driver    Comment: [employer]  Tobacco Use  . Smoking status: Former Smoker    Packs/day: 0.20    Years: 25.00    Pack years: 5.00    Types: Cigarettes    Quit date: 09/29/2016    Years since quitting: 3.7  . Smokeless tobacco: Never Used  Vaping Use  . Vaping Use: Never used  Substance and Sexual Activity  . Alcohol use: Yes    Comment: occasional  . Drug use: Yes    Types: Marijuana    Comment: occ   . Sexual activity: Yes  Other Topics Concern  . Not on file  Social History Narrative  . Not on file   Social Determinants of Health   Financial Resource Strain:   . Difficulty of Paying Living Expenses: Not on file  Food Insecurity:   . Worried About Charity fundraiser in the Last Year: Not on file  . Ran Out of Food in the Last Year: Not on file  Transportation Needs:   . Lack of Transportation (Medical): Not on file  . Lack of Transportation (Non-Medical): Not on file  Physical Activity:   . Days of Exercise per Week: Not  on file  . Minutes of Exercise per Session: Not on file  Stress:   . Feeling of Stress : Not on file  Social Connections:   . Frequency of Communication with Friends and Family: Not on file  . Frequency of Social Gatherings with Friends and Family: Not on file  . Attends Religious Services: Not on file  . Active Member of Clubs or Organizations: Not on file  . Attends Archivist Meetings: Not on file  . Marital Status: Not on file     FAMILY HISTORY:  We obtained a detailed, 4-generation family history.  Significant diagnoses are listed below: Family History  Problem Relation Age of Onset  . Breast cancer Maternal Aunt        dx 77s  . Breast cancer Cousin        maternal cousin; dx 28s  . Breast cancer Cousin        dx early 25s    [name] has one son and one daughter, both without a cancer history.  [name] brother, age 20, and  sister, age 72, have both had normal colonoscopies.  [name] mother is 61 years old and had a hysterectomy in her 72s for a reason unrelated to cancer.  [name] has a maternal aunt with breast cancer, diagnosed in her 82s, and a maternal cousin with breast cancer diagnosed in her 54s.  No other maternal family history of cancer was reported.  [name] father is 62 and does not have a cancer history.  [name] maternal cousin, age 69, was diagnosed with breast cancer in her early 78s.  No other paternal family history of cancer was reported.   [name] is unaware of previous family history of genetic testing for hereditary cancer risks. Patient's maternal ancestors are of African American descent, and paternal ancestors are of African American descent. There is no reported Ashkenazi Jewish ancestry. There is no known consanguinity.  GENETIC COUNSELING ASSESSMENT: [name] is a 49 y.o. female with a personal and family history of cancer which is somewhat suggestive of a hereditary cancer syndrome and predisposition to cancer. We, therefore, discussed and recommended the following at today's visit.   DISCUSSION: We discussed that 5 - 10% of cancer is hereditary, with most cases of hereditary endometrial cancer associated with Lynch syndrome.  We reviewed her abnormal IHC and MSI-H results and discussed that these results could be a sign of Lynch syndrome.  We briefly reviewed the endometrial, colon, and other cancer risks associated with Lynch syndrome as well as management strategies for those with Lynch syndrome. We discussed that there are other genes that can be associated with hereditary cancer syndromes.  Type of cancer and and level of cancer risk are gene-specific.  We discussed that testing is beneficial for several reasons including understanding if she has Lynch syndrome or another hereditary cancer syndrome, knowing how to follow individuals after completing  their treatment, and  understanding if other family members could be at risk for cancer and allowing them to undergo genetic testing.   We reviewed the characteristics, features and inheritance patterns of hereditary cancer syndromes. We also discussed genetic testing, including the appropriate family members to test, the process of testing, insurance coverage and turn-around-time for results. We discussed the implications of a negative, positive, carrier and/or variant of uncertain significant result. We recommended [name] pursue genetic testing for a panel that includes genes associated with endometrial and breast cancer.   [name]  was offered a common hereditary cancer panel and an expanded pan-cancer panel. [name] was informed of the benefits and limitations of each panel, including that expanded pan-cancer panels contain several preliminary evidence genes that do not have clear management guidelines at this point in time.  We also discussed that as the number of genes included on a panel increases, the chances of variants of uncertain significance increases.  After considering the benefits and limitations of each gene panel, [name] elected to have an expanded Radio broadcast assistant through [place]. [name] was also offered paired germline and somatic testing, of which we discussed the benefits and limitations. She declined paired germline/somatic testing and wished to proceed with germline only testing.   The CancerNext-Expanded gene panel offered by [facility] and includes sequencing and rearrangement analysis for the following 77 genes: AIP, ALK, APC*, ATM*, AXIN2, BAP1, BARD1, BLM, BMPR1A, BRCA1*, BRCA2*, BRIP1*, CDC73, CDH1*, CDK4, CDKN1B, CDKN2A, CHEK2*, CTNNA1, DICER1, FANCC, FH, FLCN, GALNT12, KIF1B, LZTR1, MAX, MEN1, MET, MLH1*, MSH2*, MSH3, MSH6*, MUTYH*, NBN, NF1*, NF2, NTHL1, PALB2*, PHOX2B, PMS2*, POT1, PRKAR1A, PTCH1, PTEN*, RAD51C*, RAD51D*, RB1, RECQL,  RET, SDHA, SDHAF2, SDHB, SDHC, SDHD, SMAD4, SMARCA4, SMARCB1, SMARCE1, STK11, SUFU, TMEM127, TP53*, TSC1, TSC2, VHL and XRCC2 (sequencing and deletion/duplication); EGFR, EGLN1, HOXB13, KIT, MITF, PDGFRA, POLD1, and POLE (sequencing only); EPCAM and GREM1 (deletion/duplication only). DNA and RNA analyses performed for * genes.  Based on Ms. Williamson's personal history of endometrial cancer with abnormal IHC and MSI-H, she meets medical criteria for genetic testing. Despite that she meets criteria, she may still have an out of pocket cost. We discussed that if her out of pocket cost for testing is over $100, the laboratory will call and confirm whether she wants to proceed with testing.  If the out of pocket cost of testing is less than $100 she will be billed by the genetic testing laboratory.   PLAN: After considering the risks, benefits, and limitations, [name] provided informed consent to pursue genetic testing and the blood sample was sent to [facility] for analysis of the CancerNext-Expanded + RNAInsight. Results should be available within approximately 3 weeks' time, at which point they will be disclosed by telephone to [name], as will any additional recommendations warranted by these results. [name] will receive a summary of her genetic counseling visit and a copy of her results once available. This information will also be available in Epic.   Based on Ms. Williamson's family history, we recommended her paternal cousin, who was diagnosed with breast cancer in her early 15s, have genetic counseling and testing. [name] will let us know if we can be of any assistance in coordinating genetic counseling and/or testing for this family member.   Lastly, we encouraged [name] to remain in contact with cancer genetics annually so that we can continuously update the family history and inform her of any changes in cancer genetics and testing that may be of  benefit for this family.   [name] questions were answered to her satisfaction today. Our contact information was provided should additional questions or concerns arise. Thank you for the referral and allowing [place] to share in the care of your patient.   Taylore Hinde M. Joette Catching, [place], [facility] Certified [email or web address] [email or web address] (P) [phone]  The patient was seen for a total of 40 minutes in face-to-face genetic counseling.  This patient was discussed with Drs. Magrinat, Lindi Adie and/or Burr Medico who agrees with the above.  _______________________________________________________________________ For Office Staff:  Number  of people involved in session: 1 Was an Intern/ student involved with case: no

## 2020-06-18 ENCOUNTER — Telehealth: Payer: Self-pay | Admitting: *Deleted

## 2020-06-18 DIAGNOSIS — Z006 Encounter for examination for normal comparison and control in clinical research program: Secondary | ICD-10-CM

## 2020-06-18 NOTE — Telephone Encounter (Signed)
[id number] Study; LVM for patient informing of XRT treatment scheduled to start on Monday 06/23/20. Asked patient to return call to research nurse to confirm.  Foye Spurling, BSN, RN Clinical Research Nurse 06/18/2020 4:43 PM

## 2020-06-19 NOTE — Telephone Encounter (Signed)
Called patient and informed her of her appointments on Monday 11/8 starting at 10 am.  Informed her of lab appt for research labs followed by Radiation Oncology appointments to see Dr. Sondra Come and start treatment.  Also gave patient the dates/times of all her treatments as scheduled.  Asked patient about the study questionnaires and she said she completed them yesterday.  Thanked patient for her participation and will see her on Monday. Patient verbalized understanding.  Foye Spurling, BSN, RN Clinical Research Nurse 06/19/2020 1:23 PM

## 2020-06-20 ENCOUNTER — Telehealth: Payer: Self-pay | Admitting: *Deleted

## 2020-06-20 NOTE — Telephone Encounter (Signed)
CALLED PATIENT TO REMIND OF NEW HDR [facility], SPOKE WITH PATIENT AND SHE IS AWARE OF THESE APPTS 

## 2020-06-23 ENCOUNTER — Encounter: Payer: Self-pay | Admitting: Radiation Oncology

## 2020-06-23 ENCOUNTER — Ambulatory Visit
Admission: RE | Admit: 2020-06-23 | Discharge: 2020-06-23 | Disposition: A | Discharge status: Home / Self Care | Payer: 59 | Source: Ambulatory Visit | Attending: Radiation Oncology | Admitting: Radiation Oncology

## 2020-06-23 ENCOUNTER — Ambulatory Visit: Payer: 59 | Admitting: Radiation Oncology

## 2020-06-23 ENCOUNTER — Other Ambulatory Visit: Payer: Self-pay

## 2020-06-23 ENCOUNTER — Encounter: Payer: Self-pay | Admitting: *Deleted

## 2020-06-23 ENCOUNTER — Inpatient Hospital Stay: Payer: 59

## 2020-06-23 ENCOUNTER — Telehealth: Payer: Self-pay | Admitting: *Deleted

## 2020-06-23 VITALS — BP 152/89 | HR 57 | Temp 97.1°F | Resp 18 | Ht 62.0 in | Wt 154.2 lb

## 2020-06-23 DIAGNOSIS — C541 Malignant neoplasm of endometrium: Secondary | ICD-10-CM

## 2020-06-23 DIAGNOSIS — C569 Malignant neoplasm of unspecified ovary: Secondary | ICD-10-CM | POA: Insufficient documentation

## 2020-06-23 DIAGNOSIS — Z006 Encounter for examination for normal comparison and control in clinical research program: Secondary | ICD-10-CM

## 2020-06-23 LAB — RESEARCH LABS

## 2020-06-23 NOTE — Progress Notes (Signed)
  Radiation Oncology         [phone] ________________________________  Name: Christy Waller MRN: [id number]  Date: 06/23/2020  DOB: 15-Jan-1971  CC: [name], Corinna Lines, MD  HDR BRACHYTHERAPY NOTE  DIAGNOSIS: Stage IB (pT1b, pN0) grade 2 endometrioid carcinoma with extensive LVSI   Simple treatment device note: Patient had construction of her custom vaginal cylinder. She will be treated with a 3.0 cm diameter segmented cylinder. This conforms to her anatomy without undue discomfort.  Vaginal brachytherapy procedure node: The patient was brought to the [place] suite. Identity was confirmed. All relevant records and images related to the planned course of therapy were reviewed. The patient freely provided informed written consent to proceed with treatment after reviewing the details related to the planned course of therapy. The consent form was witnessed and verified by the simulation staff. Then, the patient was set-up in a stable reproducible supine position for radiation therapy. Pelvic exam revealed the vaginal cuff to be intact . The patient's custom vaginal cylinder was placed in the proximal vagina. This was affixed to the CT/MR stabilization plate to prevent slippage. Patient tolerated the placement well.  Verification simulation note:  A fiducial marker was placed within the vaginal cylinder. An AP and lateral film was then obtained through the pelvis area. This documented accurate position of the vaginal cylinder for treatment.  HDR BRACHYTHERAPY TREATMENT  The remote afterloading device was affixed to the vaginal cylinder by catheter. Patient then proceeded to undergo her first high-dose-rate treatment directed at the proximal vagina. The patient was prescribed a dose of 6.0 gray to be delivered to the mucosal surface. Treatment length was 3.0 cm. Patient was treated with 1 channel using 7 dwell positions. Treatment time was 223.2 seconds.  Iridium 192 was the high-dose-rate source for treatment. The patient tolerated the treatment well. After completion of her therapy, a radiation survey was performed documenting return of the iridium source into the GammaMed safe.   PLAN: The patient will return later this week for her second high-dose-rate treatment. ________________________________    Blair Promise, PhD, MD  This document serves as a record of services personally performed by Gery Pray, MD. It was created on his behalf by Clerance Lav, a trained medical scribe. The creation of this record is based on the scribe's personal observations and the provider's statements to them. This document has been checked and approved by the attending provider.

## 2020-06-23 NOTE — Progress Notes (Signed)
Radiation Oncology         [phone] ________________________________  Name: Christy Waller MRN: [id number]  Date: 06/23/2020  DOB: 1970-08-18  Vaginal Brachytherapy Procedure Note  History and physical examination  NRG-GY020 MD Visit  CC: [employer] [place] [facility], Corinna Lines, MD    ICD-10-CM   1. 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 She was seen in consultation on 05/19/2020, during which time final treatment plans were pending. She had agreed to adjuvant radiation therapy but was unsure whether or not she would like to proceed with the [place] study.  Since consultation, the patient was seen by Dr. Berline Lopes on 05/29/2020. At that time, she was noted to be doing very well post-operatively and was quite interested in participating in the NRG-GY020 study and officially enrolled.  CT scan of chest, abdomen, and pelvis on 06/10/2020 did not show any findings of recurrent malignancy. However, it did show a few scattered small subcutaneous nodules primarily along the upper abdominal back that were considered to probably be small foci of subcutaneous inflammation or small subcutaneous lymph nodes. There was also peripheral enhancement in the dome of segment 7 of the liver, which was possibly a flash filling hemangioma but was technically non-specific. 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   The patient was randomized to the radiotherapy only arm.  On review of  systems, she reports no complaints. She denies vaginal bleeding and pelvic pain. She denies any abdominal bloating. She denies any problems with constipation or diarrhea. She denies any urinary difficulties. No reports of hematuria or rectal bleeding.  She did not experience any complications from her surgery.  History from previous intake:  Christy Waller is a 48 y.o. female who is seen as a courtesy of Dr. Berline Lopes for an opinion concerning radiation therapy as part of management for her recently diagnosed endometrial cancer. Today, she is accompanied by her husband. The patient presented to Dr. Delora Fuel. OB-GYN, on 04/15/2020 for evaluation of 1-2 year history of abnormal vaginal bleeding. Endometrial biopsy was performed at that time and showed grade 2 endometrial adenocarcinoma.  Given the above findings, the patient was referred to Dr. Berline Lopes and was seen in consultation on 04/25/2020. It was recommended that she proceed with surgical staging.   The patient underwent a robotic-assisted laparoscopic total hysterectomy with bilateral salpingo-oophorectomy, sentinel lymph node biopsy, and mini-lap for specimen removal on 05/01/2020 under the care of Dr. Berline Lopes. Pathology from the procedure revealed FIGO grade 2 endometrioid carcinoma of the uterus. There was also noted to be lymphovascular space involvement of more than three vessels within the myometrium, which by definition was considered extensive involvement.  The tumor was deeply invasive into the myometrium (25 of 29 mm).  no carcinoma was identified in the cervix, bilateral ovaries, a single right obturator sentinel lymph node, a single right external iliac sentinel lymph node, a single right pre-sacral sentinel lymph node, and a single left obturator sentinel lymph node.   The patient's case was discussed at the [place]  on 05/09/2020, during which time it was recommended that she be considered for   pelvic radiation given the findings of extensive lymphovascular space invasion. She is also considering participating in the [facility] GY020 research study.  She does qualify for the study based on MMR / MSI testing (Mismatch Repair Protein (IHC)   SUMMARY INTERPRETATION: ABNORMAL ) . To be a part of the study however, with the patient's grade 2 tumor she would not be a candidate for external beam radiation therapy directed at the pelvis area, but only vaginal brachytherapy.  ALLERGIES: is allergic to lisinopril.  Meds: Current Outpatient Medications  Medication Sig Dispense Refill  . amLODipine (NORVASC) 10 MG tablet Take 10 mg by mouth daily.     . Multiple Vitamin (MULTIVITAMIN WITH MINERALS) TABS tablet Take 1 tablet by mouth daily.    [name] VITAMIN D PO Take 2,000 Units by mouth daily.      No current facility-administered medications for this encounter.    Physical Findings: The patient is in no acute distress. Patient is alert and oriented.  height is 5' 2"  (1.575 m) and weight is 154 lb 4 oz (70 kg). Her temporal temperature is 97.1 F (36.2 C) (abnormal). Her blood pressure is 152/89 (abnormal) and her pulse is 57 (abnormal). Her respiration is 18 and oxygen saturation is 99%.   No palpable cervical, supraclavicular or axillary lymphoadenopathy. The heart has a regular rate and rhythm. The lungs are clear to auscultation. Abdomen soft and non-tender. Laparoscopy scars well-healed. No inguinal adenopathy  On pelvic examination the external genitalia were unremarkable. A speculum exam was performed. Vaginal cuff intact, no mucosal lesions. On bimanual exam there were no pelvic masses appreciated.  ECOG = 0  0 - Asymptomatic (Fully active, able to carry on all predisease activities without restriction)  1 - Symptomatic but completely ambulatory (Restricted in physically strenuous activity but ambulatory and able to carry out work of a light or sedentary nature. For example, light housework,  office work)  2 - Symptomatic, <50% in bed during the day (Ambulatory and capable of all self care but unable to carry out any work activities. Up and about more than 50% of waking hours)  3 - Symptomatic, >50% in bed, but not bedbound (Capable of only limited self-care, confined to bed or chair 50% or more of waking hours)  4 - Bedbound (Completely disabled. Cannot carry on any self-care. Totally confined to bed or chair)  5 - Death   Eustace Pen MM, Creech RH, Tormey DC, et al. [phone]). "Toxicity and response criteria of the [facility]". [place] Oncol. 5 (6): 649-55  Lab Findings: Lab Results  Component Value Date   WBC 6.4 06/10/2020   HGB 12.7 06/10/2020   HCT 37.8 06/10/2020   MCV 84.4 06/10/2020   PLT 232 06/10/2020    Radiographic Findings: CT CHEST ABDOMEN PELVIS W CONTRAST  Result Date: 06/11/2020 CLINICAL DATA:  Staging of endometrial adenocarcinoma. EXAM: CT CHEST, ABDOMEN, AND PELVIS WITH CONTRAST TECHNIQUE: Multidetector CT imaging of the chest, abdomen and pelvis was performed following the standard protocol during bolus administration of intravenous contrast. CONTRAST:  170m OMNIPAQUE IOHEXOL 300 MG/ML  SOLN COMPARISON:  Chest CT 03/22/2012 and pelvic ultrasound 03/19/2013 FINDINGS: CT CHEST FINDINGS Cardiovascular: Mild cardiomegaly. Mediastinum/Nodes: No pathologic adenopathy identified. Small bilateral axillary lymph nodes are not significantly enlarged. Lungs/Pleura: No significant abnormality. Musculoskeletal: Dextroconvex thoracic scoliosis. Nonspecific sclerosis in the left pedicle and adjacent vertebral body at T10 on image 36  of series 2, mildly increased from 03/22/2012. Lesser degree of sclerosis in the left T11 pedicle. CT ABDOMEN PELVIS FINDINGS Hepatobiliary: Peripheral enhancement in the dome of segment 7 of the liver on image 44 series 2 measuring 1.2 by 1.0 by 0.8 cm, possibly a flash filling hemangioma but technically nonspecific. 0.4  cm hypodense lesion in the right hepatic lobe on image 47 of series 2, statistically likely to be a cyst or similar benign lesion, but technically nonspecific. Contracted gallbladder unremarkable.  No biliary dilatation. Pancreas: Unremarkable Spleen: 0.7 cm hypodense lesion posteriorly in the spleen on image 48 of series 2 is too small to characterize, although statistically likely to be benign. Adrenals/Urinary Tract: Unremarkable Stomach/Bowel: Unremarkable Vascular/Lymphatic: Left external iliac lymph node 0.7 cm in short axis. Right external iliac lymph node 0.7 cm in short axis. No pathologically enlarged lymph nodes currently identified. Reproductive: Uterus and ovaries surgically absent. No abnormal nodularity along the vaginal cuff. Other: There are few scattered small subcutaneous nodules primarily along the upper abdominal back, for example a 0.8 by 0.6 cm structure on image 49 series 2. These may represent small foci of subcutaneous inflammation or small subcutaneous lymph nodes. Musculoskeletal: Bilateral sclerosis along the sacroiliac joints, especially the iliac side. Sclerosis of both pubic bones. 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 Facet arthropathy in the lumbar spine especially on the left at L4-5. IMPRESSION: 1. No findings of recurrent malignancy. 2. There are a few scattered small subcutaneous nodules primarily along the upper abdominal back, probably small foci of subcutaneous inflammation or small subcutaneous lymph nodes. 3. Peripheral enhancement in the dome of segment 7 of the liver, possibly a flash filling hemangioma but technically nonspecific. This could be further characterized with hepatic protocol MRI with and without contrast if clinically warranted. 4. 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 There is also some sclerosis in the  left T10 and T11 pedicle, mildly increased from 2013, and probably from costovertebral arthropathy. 5. Dextroconvex thoracic scoliosis. 6. Mild cardiomegaly. 7. Facet arthropathy in the lumbar spine especially on the left at L4-5. Electronically Signed   By: Van Clines M.D.   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 The patient will be treated with a 3.0 cm diameter cylinder with a treatment length of 3.0 cm. This distended the vaginal vault without undue discomfort. The patient tolerated the procedure well.  The patient was successfully fitted for a vaginal cylinder. The patient is appropriate to begin vaginal brachytherapy.   Plan: The patient will proceed with CT simulation and her first vaginal brachytherapy treatment today.    _______________________________   Blair Promise, PhD, MD  This document serves as a record of services personally performed by Gery Pray, MD. It was created on his behalf by Clerance Lav, a trained medical scribe. The creation of this record is based on the scribe's personal observations and the provider's statements to them. This document has been checked and approved by the attending provider.

## 2020-06-23 NOTE — Progress Notes (Signed)
Patient here for a HDR/VCC with Dr. Sondra Come. Patient denies vaginal bleeding or pain.  BP (!) 152/89 (BP Location: Left Arm, Patient Position: Sitting)   Pulse (!) 57   Temp (!) 97.1 F (36.2 C) (Temporal)   Resp 18   Ht 5\' 2"  (1.575 m)   Wt 154 lb 4 oz (70 kg)   SpO2 99%   BMI 28.21 kg/m    Wt Readings from Last 3 Encounters:  06/23/20 154 lb 4 oz (70 kg)  05/29/20 150 lb 12.8 oz (68.4 kg)  05/19/20 148 lb (67.1 kg)

## 2020-06-23 NOTE — Research (Signed)
NRG-GY020 Arm 1, Week 1 Visit PROs; Completed by patient at home on 06/18/20. Tumor Submission; FFPE block of primary tumor shipped to study on 06/17/20. Blood Biospecimens; Blood samples collected this morning prior to treatment.  Con Meds; Patient denies any medication changes since last week. Current medication list is up to date.  VS; Collected prior to MD visit. See VS flowsheet.  H&P, PS and Toxicity assessments; Completed by Dr. Sondra Come prior to simulation and treatment today. See provider's note.  Baseline History; Patient has history of HTN and her blood pressure is elevated today. Patient states she took her amlodipine this morning. Patient also states she checks her blood pressure at home regularly and it is usually in the "130s over 80s range".  Patient states it is only high in doctor's office due to anxiety.  Instructed patient to keep checking at home several times a week and report to PCP if it is elevated over normal at home. Patient verbalized understanding.  Plan; Patient to be simulated for HDR today with treatment 1 starting today.  Patient is scheduled for 5 total fractions with next treatments scheduled for 11/15, 11/18, 11/22 and 11/24.  Week 7 study visit scheduled in 6 weeks on 08/01/20 with research labs and MD visit.  Thanked patient for her participation in this clinical trial. Informed patient research nurse will call her before her next study visit in 6 weeks to remind her to complete the questionnaires at home prior to visit. Encouraged patient to contact research nurse if any questions or concerns in the meantime. Patient verbalized understanding.  Foye Spurling, BSN, RN Clinical Research Nurse 06/23/2020 12:42 PM

## 2020-06-23 NOTE — Telephone Encounter (Signed)
CALLED PATIENT TO INFORM OF HDR [place] TO 06-26-20 @ 9 AM, LVM FOR A RETURN CALL

## 2020-06-24 ENCOUNTER — Telehealth: Payer: Self-pay | Admitting: *Deleted

## 2020-06-24 NOTE — Telephone Encounter (Signed)
Called patient to inform of HDR [place] Tx. For 06-26-20 @ 9 am, spoke with patient and she is aware of this tx.

## 2020-06-25 ENCOUNTER — Telehealth: Payer: Self-pay | Admitting: *Deleted

## 2020-06-25 NOTE — Progress Notes (Signed)
  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 She will be treated with a 3.0 cm diameter segmented cylinder. This conforms to her anatomy without undue discomfort.  Vaginal brachytherapy procedure node: The patient was brought to the [place] suite. Identity was confirmed. All relevant records and images related to the planned course of therapy were reviewed. The patient freely provided informed written consent to proceed with treatment after reviewing the details related to the planned course of therapy. The consent form was witnessed and verified by the simulation staff. Then, the patient was set-up in a stable reproducible supine position for radiation therapy. Pelvic exam revealed the vaginal cuff to be intact . The patient's custom vaginal cylinder was placed in the proximal vagina. This was affixed to the CT/MR stabilization plate to prevent slippage. Patient tolerated the placement well.  Verification simulation note:  A fiducial marker was placed within the vaginal cylinder. An AP and lateral film was then obtained through the pelvis area. This documented accurate position of the vaginal cylinder for treatment.  HDR BRACHYTHERAPY TREATMENT  The remote afterloading device was affixed to the vaginal cylinder by catheter. Patient then proceeded to undergo her second high-dose-rate treatment directed at the proximal vagina. The patient was prescribed a dose of 6.0 gray to be delivered to the mucosal surface. Treatment length was 3.0 cm. Patient was treated with 1 channel using 7 dwell positions. Treatment time was 229.6  seconds. Iridium 192 was the high-dose-rate source for treatment. The patient tolerated the treatment well. After completion of her therapy, a radiation survey was performed documenting return of the iridium source into the GammaMed safe.   PLAN: The patient will return next week for her third high-dose-rate treatment.  She reports tolerating her first brachytherapy procedure well.  She reported mild nausea not requiring any pain medication.  she denied any significant vaginal soreness or urinary symptoms.  She did report some very mild diarrhea.   ________________________________    Blair Promise, PhD, MD  This document serves as a record of services personally performed by Gery Pray, MD. It was created on his behalf by Clerance Lav, a trained medical scribe. The creation of this record is based on the scribe's personal observations and the provider's statements to them. This document has been checked and approved by the attending provider.

## 2020-06-25 NOTE — Telephone Encounter (Signed)
CALLED PATIENT TO REMIND OF HDR TX. FOR 06-26-20 @ 9 AM, SPOKE WITH PATIENT AND SHE IS AWARE OF THIS [place].

## 2020-06-26 ENCOUNTER — Ambulatory Visit
Admission: RE | Admit: 2020-06-26 | Discharge: 2020-06-26 | Disposition: A | Discharge status: Home / Self Care | Payer: 59 | Source: Ambulatory Visit | Attending: Radiation Oncology | Admitting: Radiation Oncology

## 2020-06-26 ENCOUNTER — Other Ambulatory Visit: Payer: Self-pay

## 2020-06-26 DIAGNOSIS — C541 Malignant neoplasm of endometrium: Secondary | ICD-10-CM

## 2020-06-26 DIAGNOSIS — C569 Malignant neoplasm of unspecified ovary: Secondary | ICD-10-CM | POA: Diagnosis not present

## 2020-06-27 ENCOUNTER — Telehealth: Payer: Self-pay | Admitting: *Deleted

## 2020-06-27 NOTE — Telephone Encounter (Signed)
Called patient to remind of [place]. for 06-30-20 @ 9 am, spoke with patient and she is aware of this tx.

## 2020-06-30 ENCOUNTER — Ambulatory Visit
Admission: RE | Admit: 2020-06-30 | Discharge: 2020-06-30 | Disposition: A | Discharge status: Home / Self Care | Payer: 59 | Source: Ambulatory Visit | Attending: Radiation Oncology | Admitting: Radiation Oncology

## 2020-06-30 DIAGNOSIS — C541 Malignant neoplasm of endometrium: Secondary | ICD-10-CM

## 2020-06-30 DIAGNOSIS — C569 Malignant neoplasm of unspecified ovary: Secondary | ICD-10-CM | POA: Diagnosis not present

## 2020-06-30 NOTE — Progress Notes (Signed)
  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 She will be treated with a 3.0 cm diameter segmented cylinder. This conforms to her anatomy without undue discomfort.  Vaginal brachytherapy procedure node: The patient was brought to the [place] suite. Identity was confirmed. All relevant records and images related to the planned course of therapy were reviewed. The patient freely provided informed written consent to proceed with treatment after reviewing the details related to the planned course of therapy. The consent form was witnessed and verified by the simulation staff. Then, the patient was set-up in a stable reproducible supine position for radiation therapy. Pelvic exam revealed the vaginal cuff to be intact . The patient's custom vaginal cylinder was placed in the proximal vagina. This was affixed to the CT/MR stabilization plate to prevent slippage. Patient tolerated the placement well.  Verification simulation note:  A fiducial marker was placed within the vaginal cylinder. An AP and lateral film was then obtained through the pelvis area. This documented accurate position of the vaginal cylinder for treatment.  HDR BRACHYTHERAPY TREATMENT  The remote afterloading device was affixed to the vaginal cylinder by catheter. Patient then proceeded to undergo her third high-dose-rate treatment directed at the proximal vagina. The patient was prescribed a dose of 6.0 gray to be delivered to the mucosal surface. Treatment length was 3.0 cm. Patient was treated with 1 channel using 7 dwell positions. Treatment time was  238.6 seconds. Iridium 192 was the high-dose-rate source for treatment. The patient tolerated the treatment well. After completion of her therapy, a radiation survey was performed documenting return of the iridium source into the GammaMed safe.   PLAN: The patient will return in three days for her fourth high-dose-rate treatment.  She did report some diarrhea yesterday but relates this to food intake.  She also had some mild nausea after her second high-dose-rate treatment similar to her nausea with the first high-dose-rate treatment.  She did not require any medication for this issue.  She denies any vaginal bleeding or discharge.  ________________________________    Blair Promise, PhD, MD  This document serves as a record of services personally performed by Gery Pray, MD. It was created on his behalf by Clerance Lav, a trained medical scribe. The creation of this record is based on the scribe's personal observations and the provider's statements to them. This document has been checked and approved by the attending provider.

## 2020-07-02 ENCOUNTER — Telehealth: Payer: Self-pay | Admitting: *Deleted

## 2020-07-02 NOTE — Telephone Encounter (Signed)
CALLED PATIENT TO REMIND OF HDR TX. FOR 07-03-20 @ 2 PM, SPOKE WITH PATIENT AND SHE IS AWARE OF THIS [place].

## 2020-07-03 ENCOUNTER — Ambulatory Visit
Admission: RE | Admit: 2020-07-03 | Discharge: 2020-07-03 | Disposition: A | Discharge status: Home / Self Care | Payer: 59 | Source: Ambulatory Visit | Attending: Radiation Oncology | Admitting: Radiation Oncology

## 2020-07-03 DIAGNOSIS — C541 Malignant neoplasm of endometrium: Secondary | ICD-10-CM

## 2020-07-03 DIAGNOSIS — C569 Malignant neoplasm of unspecified ovary: Secondary | ICD-10-CM | POA: Diagnosis not present

## 2020-07-03 NOTE — Progress Notes (Signed)
  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 She will be treated with a 3.0 cm diameter segmented cylinder. This conforms to her anatomy without undue discomfort.  Vaginal brachytherapy procedure node: The patient was brought to the [place] suite. Identity was confirmed. All relevant records and images related to the planned course of therapy were reviewed. The patient freely provided informed written consent to proceed with treatment after reviewing the details related to the planned course of therapy. The consent form was witnessed and verified by the simulation staff. Then, the patient was set-up in a stable reproducible supine position for radiation therapy. Pelvic exam revealed the vaginal cuff to be intact . The patient's custom vaginal cylinder was placed in the proximal vagina. This was affixed to the CT/MR stabilization plate to prevent slippage. Patient tolerated the placement well.  Verification simulation note:  A fiducial marker was placed within the vaginal cylinder. An AP and lateral film was then obtained through the pelvis area. This documented accurate position of the vaginal cylinder for treatment.  HDR BRACHYTHERAPY TREATMENT  The remote afterloading device was affixed to the vaginal cylinder by catheter. Patient then proceeded to undergo her fourth high-dose-rate treatment directed at the proximal vagina. The patient was prescribed a dose of 6.0 gray to be delivered to the mucosal surface. Treatment length was 3.0 cm. Patient was treated with 1 channel using 7 dwell positions. Treatment time was  245.3 seconds. Iridium 192 was the high-dose-rate source for treatment. The patient tolerated the treatment well. After completion of her therapy, a radiation survey was performed documenting return of the iridium source into the GammaMed safe.   PLAN: The patient will return in four days for her fifth and final treatment.  She reported no side effects after her third high-dose-rate treatment. ________________________________    Blair Promise, PhD, MD  This document serves as a record of services personally performed by Gery Pray, MD. It was created on his behalf by Clerance Lav, a trained medical scribe. The creation of this record is based on the scribe's personal observations and the provider's statements to them. This document has been checked and approved by the attending provider.

## 2020-07-04 ENCOUNTER — Telehealth: Payer: Self-pay | Admitting: *Deleted

## 2020-07-04 NOTE — Telephone Encounter (Signed)
CALLED PATIENT TO REMIND OF HDR TX. FOR 07-07-20 @ 9 AM, SPOKE WITH PATIENT AND SHE IS AWARE OF THIS [place].

## 2020-07-06 NOTE — Progress Notes (Signed)
  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 She will be treated with a 3.0 cm diameter segmented cylinder. This conforms to her anatomy without undue discomfort.  Vaginal brachytherapy procedure node: The patient was brought to the [place] suite. Identity was confirmed. All relevant records and images related to the planned course of therapy were reviewed. The patient freely provided informed written consent to proceed with treatment after reviewing the details related to the planned course of therapy. The consent form was witnessed and verified by the simulation staff. Then, the patient was set-up in a stable reproducible supine position for radiation therapy. Pelvic exam revealed the vaginal cuff to be intact . The patient's custom vaginal cylinder was placed in the proximal vagina. This was affixed to the CT/MR stabilization plate to prevent slippage. Patient tolerated the placement well.  Verification simulation note:  A fiducial marker was placed within the vaginal cylinder. An AP and lateral film was then obtained through the pelvis area. This documented accurate position of the vaginal cylinder for treatment.  HDR BRACHYTHERAPY TREATMENT  The remote afterloading device was affixed to the vaginal cylinder by catheter. Patient then proceeded to undergo her fifth high-dose-rate treatment directed at the proximal vagina. The patient was prescribed a dose of 6.0 gray to be delivered to the mucosal surface. Treatment length was 3.0 cm. Patient was treated with 1 channel using 7 dwell positions. Treatment time was  254.8 seconds. Iridium 192 was the high-dose-rate source for treatment. The patient tolerated the treatment well. After completion of her therapy, a radiation survey was performed documenting return of the iridium source into the [employer] safe.   PLAN: The patient will return in one month for routine follow-up.  She tolerated her last brachytherapy well without any side effects. ________________________________    Blair Promise, PhD, MD  This document serves as a record of services personally performed by Gery Pray, MD. It was created on his behalf by Clerance Lav, a trained medical scribe. The creation of this record is based on the scribe's personal observations and the provider's statements to them. This document has been checked and approved by the attending provider.

## 2020-07-07 ENCOUNTER — Other Ambulatory Visit: Payer: Self-pay

## 2020-07-07 ENCOUNTER — Ambulatory Visit
Admission: RE | Admit: 2020-07-07 | Discharge: 2020-07-07 | Disposition: A | Discharge status: Home / Self Care | Payer: 59 | Source: Ambulatory Visit | Attending: Radiation Oncology | Admitting: Radiation Oncology

## 2020-07-07 ENCOUNTER — Encounter: Payer: Self-pay | Admitting: Radiation Oncology

## 2020-07-07 DIAGNOSIS — C541 Malignant neoplasm of endometrium: Secondary | ICD-10-CM

## 2020-07-07 DIAGNOSIS — C569 Malignant neoplasm of unspecified ovary: Secondary | ICD-10-CM | POA: Diagnosis not present

## 2020-07-08 ENCOUNTER — Telehealth: Payer: Self-pay | Admitting: Genetic Counselor

## 2020-07-08 ENCOUNTER — Ambulatory Visit: Payer: Self-pay | Admitting: Genetic Counselor

## 2020-07-08 ENCOUNTER — Encounter: Payer: Self-pay | Admitting: Genetic Counselor

## 2020-07-08 DIAGNOSIS — Z1379 Encounter for other screening for genetic and chromosomal anomalies: Secondary | ICD-10-CM | POA: Insufficient documentation

## 2020-07-08 DIAGNOSIS — Z803 Family history of malignant neoplasm of breast: Secondary | ICD-10-CM

## 2020-07-08 DIAGNOSIS — C541 Malignant neoplasm of endometrium: Secondary | ICD-10-CM

## 2020-07-08 NOTE — Progress Notes (Signed)
HPI:  [name] was previously seen in the [place] [facility] due to a personal and family history of cancer and concerns regarding a hereditary predisposition to cancer. Please refer to our prior cancer genetics clinic note for more information regarding our discussion, assessment and recommendations, at the time. [name] recent genetic test results were disclosed to her, as were recommendations warranted by these results. These results and recommendations are discussed in more detail below.  CANCER HISTORY:  In September 2021, at the age of 49, [name] was diagnosed with endometrial cancer with loss of MSH2 and MSH6 by immunohistochemistry and microsatellite instability-high.  She had a robotic-assisted laproscopic total hysterectomy with bilateral salpingo-ophorectomy.   Oncology History Overview Note  MMR IHC - loss of MSH2 and 6 expression MSI - High Genetics appt pending   Endometrial cancer [facility])   Initial Diagnosis   Endometrial cancer ([place])   04/17/2020 Initial Biopsy   EMB: Grade 2 endometrioid adenocarcinoma    05/01/2020 Surgery   Robotic-assisted laparoscopic total hysterectomy with bilateral salpingoophorectomy, SLN biopsy, mini-lap for specimen removal  On EUA, 10cm mobile uterus. On intra-abdominal entry, normal upper abdominal survey. Normal small and large bowel. Omentum adherent to the anterior abdominal wall. Sigmoid epiploica adherent to the posterior uterus. Uterus 10-12cm, bulbous fundus. Adnexa adherent to posterior uterus, otherwise normal appearing. Mapping successful to right external and right obturator SLN, left obturator SLN, right presacral. All lymph nodes prominent.  No intra-abdominal or pelvic evidence of disease otherwise.  Some adhesions between the bladder and lower uterine segment/cervix, consistent with patient's prior C-section history.   05/01/2020 Pathology Results   A. UTERUS, CERVIX, BILATERAL TUBES AND OVARIES,  RESECTION:   Uterus:  -  Endometrioid carcinoma, FIGO grade 2  -  See oncology table and comment below   Cervix:  -  No carcinoma identified   Bilateral Ovaries:  -  No carcinoma identified   B. LYMPH NODE, SENTINEL, RIGHT OBTURATOR, EXCISION:  -  No carcinoma identified in one lymph node (0/1)  -  See comment   C. LYMPH NODE, SENTINEL, RIGHT EXTERNAL ILIAC, EXCISION:  -  No carcinoma identified in one lymph node (0/1)  -  See comment   D. LYMPH NODE, SENTINEL, RIGHT PRE-SACRAL, EXCISION:  -  No carcinoma identified in one lymph node (0/1)  -  See comment   E. LYMPH NODE, SENTINEL, LEFT OBTURATOR, EXCISION:  -  No carcinoma identified in one lymph node (0/1)  -  See comment URGICAL PATHOLOGY    05/01/2020 Cancer Staging   Staging form: Corpus Uteri - Carcinoma and Carcinosarcoma, AJCC 8th Edition - Clinical stage from 05/01/2020: FIGO Stage IB (cT1b, cN0(sn), cM0) - Signed by Christy Mosses, MD on 05/08/2020   06/11/2020 Imaging   1. No findings of recurrent malignancy. 2. There are a few scattered small subcutaneous nodules primarily along the upper abdominal back, probably small foci of subcutaneous inflammation or small subcutaneous lymph nodes. 3. Peripheral enhancement in the dome of segment 7 of the liver, possibly a flash filling hemangioma but technically nonspecific. This could be further characterized with hepatic protocol MRI with and without contrast if clinically warranted. 4. 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 There is also some sclerosis in the left T10 and T11 pedicle, mildly increased from 2013, and probably from costovertebral arthropathy. 5. Dextroconvex thoracic scoliosis. 6. Mild cardiomegaly. 7. Facet arthropathy in the lumbar spine  especially on the left at L4-5.   07/07/2020 Genetic Testing   Negative genetic testing: no pathogenic variants detected  in [facility] CancerNext-Expanded +RNAinsight Panel.  The report date is July 07, 2020.   The CancerNext-Expanded gene panel offered by [facility] and includes sequencing, rearrangement, and RNA analysis for the following 77 genes: AIP, ALK, APC, ATM, AXIN2, BAP1, BARD1, BLM, BMPR1A, BRCA1, BRCA2, BRIP1, CDC73, CDH1, CDK4, CDKN1B, CDKN2A, CHEK2, CTNNA1, DICER1, FANCC, FH, FLCN, GALNT12, KIF1B, LZTR1, MAX, MEN1, MET, MLH1, MSH2, MSH3, MSH6, MUTYH, NBN, NF1, NF2, NTHL1, PALB2, PHOX2B, PMS2, POT1, PRKAR1A, PTCH1, PTEN, RAD51C, RAD51D, RB1, RECQL, RET, SDHA, SDHAF2, SDHB, SDHC, SDHD, SMAD4, SMARCA4, SMARCB1, SMARCE1, STK11, SUFU, TMEM127, TP53, TSC1, TSC2, VHL and XRCC2 (sequencing and deletion/duplication); EGFR, EGLN1, HOXB13, KIT, MITF, PDGFRA, POLD1, and POLE (sequencing only); EPCAM and GREM1 (deletion/duplication only).      FAMILY HISTORY:  We obtained a detailed, 4-generation family history.  Significant diagnoses are listed below: Family History  Problem Relation Age of Onset  . Breast cancer Maternal Aunt        dx 71s  . Breast cancer Cousin        maternal cousin; dx 66s  . Breast cancer Cousin      Paternal cousin; dx early 76s    [name] has one son and one daughter, both without a cancer history.  [name] brother, age 72, and sister, age 3, have both had normal colonoscopies.  [name] mother is 23 years old and had a hysterectomy in her 88s for a reason unrelated to cancer.  [name] has a maternal aunt with breast cancer, diagnosed in her 47s, and a maternal cousin with breast cancer diagnosed in her 51s.  No other maternal family history of cancer was reported.  [name] father is 44 and does not have a cancer history.  [name] maternal cousin, age 8, was diagnosed with breast cancer in her early 68s.  No other paternal family history of cancer was reported.   [name] is unaware of previous family history of genetic testing  for hereditary cancer risks. Patient's maternal ancestors are of African American descent, and paternal ancestors are of African American descent. There is no reported Ashkenazi Jewish ancestry. There is no known consanguinity.  GENETIC TEST RESULTS: Genetic testing reported out on July 07, 2020.  The [facility] CancerNext-Expanded +RNAinsight Panel found no pathogenic mutations. The CancerNext-Expanded gene panel offered by [facility] and includes sequencing, rearrangement, and RNA analysis for the following 77 genes: AIP, ALK, APC, ATM, AXIN2, BAP1, BARD1, BLM, BMPR1A, BRCA1, BRCA2, BRIP1, CDC73, CDH1, CDK4, CDKN1B, CDKN2A, CHEK2, CTNNA1, DICER1, FANCC, FH, FLCN, GALNT12, KIF1B, LZTR1, MAX, MEN1, MET, MLH1, MSH2, MSH3, MSH6, MUTYH, NBN, NF1, NF2, NTHL1, PALB2, PHOX2B, PMS2, POT1, PRKAR1A, PTCH1, PTEN, RAD51C, RAD51D, RB1, RECQL, RET, SDHA, SDHAF2, SDHB, SDHC, SDHD, SMAD4, SMARCA4, SMARCB1, SMARCE1, STK11, SUFU, TMEM127, TP53, TSC1, TSC2, VHL and XRCC2 (sequencing and deletion/duplication); EGFR, EGLN1, HOXB13, KIT, MITF, PDGFRA, POLD1, and POLE (sequencing only); EPCAM and GREM1 (deletion/duplication only).   The test report has been scanned into EPIC and is located under the Molecular Pathology section of the Results Review tab.  A portion of the result report is included below for reference.     We discussed with [name] that because current genetic testing is not perfect, it is possible there may be a gene mutation in one of these genes that current testing cannot detect. We also discussed, that there could be another gene that  has not yet been discovered, or that we have not yet tested, that is responsible for the cancer diagnoses in the family. It is also possible there is a hereditary cause for the cancer in the family that [name] did not inherit and therefore was not identified in her testing.  Therefore, it is important to remain in touch with cancer genetics in the future so that  we can continue to offer [name] the most up to date genetic testing.    ADDITIONAL GENETIC TESTING: We discussed with [name] that her genetic testing was fairly extensive.  If there are genes identified to increase cancer risk that can be analyzed in the future, we would be happy to discuss and coordinate this testing at that time.    CANCER SCREENING RECOMMENDATIONS: [name] test result is considered negative (normal).  This means that we have not identified a hereditary cause for her personal history of cancer at this time. Most cancers happen by chance and this negative test suggests that her cancer may fall into this category.    This does not definitively rule out a hereditary predisposition to cancer. It is still possible that there could be genetic mutations that are undetectable by current technology. There could be genetic mutations in genes that have not been tested or identified to increase cancer risk.  Therefore, it is recommended she continue to follow the cancer management and screening guidelines provided by her oncology and primary healthcare provider.   An individual's cancer risk and medical management are not determined by genetic test results alone. Overall cancer risk assessment incorporates additional factors, including personal medical history, family history, and any available genetic information that may result in a personalized plan for cancer prevention and surveillance  RECOMMENDATIONS FOR FAMILY MEMBERS:  Individuals in this family might be at some increased risk of developing cancer, over the general population risk, simply due to the family history of cancer.  We recommended women in this family have a yearly mammogram beginning at age 62, or 86 years younger than the earliest onset of cancer, an annual clinical breast exam, and perform monthly breast self-exams. Women in this family should also have a gynecological exam as recommended by their  primary provider. All family members should be referred for colonoscopy starting at age 16.  It is also possible there is a hereditary cause for the cancer in Ms. Williamson's family that she did not inherit and therefore was not identified in her.  Based on Ms. Williamson's family history, we recommended her paternal cousin, who was diagnosed with breast cancer in her early 57s, have genetic counseling and testing. [name] will let us know if we can be of any assistance in coordinating genetic counseling and/or testing for this family member.   FOLLOW-UP: Lastly, we discussed with [name] that cancer genetics is a rapidly advancing field and it is possible that new genetic tests will be appropriate for her and/or her family members in the future. We encouraged her to remain in contact with cancer genetics on an annual basis so we can update her personal and family histories and let her know of advances in cancer genetics that may benefit this family.   Our contact number was provided. [name] questions were answered to her satisfaction, and she knows she is welcome to call us at anytime with additional questions or concerns.   Lakena Sparlin M. Joette Catching, [place], [facility] Certified Film/video [email or web address] [phone]

## 2020-07-08 NOTE — Telephone Encounter (Signed)
Revealed negative genetic testing.  Discussed that we do not know why she has endometrial cancer or why there is cancer in the family. It could be sporadic, due to a different gene that we are not testing, or maybe our current technology may not be able to pick something up.  It will be important for her to keep in contact with genetics to keep up with whether additional testing may be needed.

## 2020-07-09 ENCOUNTER — Ambulatory Visit: Payer: 59 | Admitting: Radiation Oncology

## 2020-07-28 ENCOUNTER — Telehealth: Payer: Self-pay | Admitting: *Deleted

## 2020-07-28 NOTE — Telephone Encounter (Signed)
Called patient to ask about changing fu on 07-31-20 to 4:15 pm, patient agreed to new time

## 2020-07-30 ENCOUNTER — Telehealth: Payer: Self-pay | Admitting: *Deleted

## 2020-07-30 NOTE — Telephone Encounter (Signed)
[id number]; Notified patient that her appointment with Dr. Earney Hamburg has been moved back to Friday 12/17 per study protocol.  Informed patient of new time at 10 am and lab will be moved to 9:30 am for the research labs.  Patient verbalized understanding.  Thanked patient for her flexibility. Instructed patient to check her phone for the ePRO on the Patient Cloud App and complete prior to visit. Asked patient to call research nurse back if any questions or problems with completing the questionnaires. Patient verbalized understanding.  Foye Spurling, BSN, RN Clinical Research Nurse 07/30/2020 2:45 PM

## 2020-07-31 ENCOUNTER — Ambulatory Visit: Payer: 59 | Admitting: Radiation Oncology

## 2020-08-01 ENCOUNTER — Ambulatory Visit
Admission: RE | Admit: 2020-08-01 | Discharge: 2020-08-01 | Disposition: A | Discharge status: Home / Self Care | Payer: 59 | Source: Ambulatory Visit | Attending: Radiation Oncology | Admitting: Radiation Oncology

## 2020-08-01 ENCOUNTER — Other Ambulatory Visit: Payer: Self-pay

## 2020-08-01 ENCOUNTER — Encounter: Payer: Self-pay | Admitting: Radiation Oncology

## 2020-08-01 ENCOUNTER — Inpatient Hospital Stay: Payer: 59 | Attending: Gynecologic Oncology

## 2020-08-01 ENCOUNTER — Encounter: Payer: Self-pay | Admitting: *Deleted

## 2020-08-01 ENCOUNTER — Ambulatory Visit: Payer: Self-pay | Admitting: Radiation Oncology

## 2020-08-01 DIAGNOSIS — Z8542 Personal history of malignant neoplasm of other parts of uterus: Secondary | ICD-10-CM | POA: Insufficient documentation

## 2020-08-01 DIAGNOSIS — C541 Malignant neoplasm of endometrium: Secondary | ICD-10-CM

## 2020-08-01 DIAGNOSIS — Z006 Encounter for examination for normal comparison and control in clinical research program: Secondary | ICD-10-CM

## 2020-08-01 DIAGNOSIS — Z79899 Other long term (current) drug therapy: Secondary | ICD-10-CM | POA: Diagnosis not present

## 2020-08-01 DIAGNOSIS — Z923 Personal history of irradiation: Secondary | ICD-10-CM | POA: Insufficient documentation

## 2020-08-01 DIAGNOSIS — I10 Essential (primary) hypertension: Secondary | ICD-10-CM | POA: Insufficient documentation

## 2020-08-01 LAB — RESEARCH LABS

## 2020-08-01 NOTE — Progress Notes (Addendum)
Patient here for a f/u visit with Dr. Sondra Come. Patient reports having pain in her right wrist for 24 hours. Patient denies any vaginal bleeding, pelvic pain or GU sx.  BP (!) 154/100 (BP Location: Left Arm, Patient Position: Sitting)   Pulse 60   Temp (!) 97.2 F (36.2 C) (Temporal)   Resp 18   Ht 5\' 2"  (1.575 m)   Wt 157 lb 4 oz (71.3 kg)   SpO2 100%   BMI 28.76 kg/m   Wt Readings from Last 3 Encounters:  08/01/20 157 lb 4 oz (71.3 kg)  06/23/20 154 lb 4 oz (70 kg)  05/29/20 150 lb 12.8 oz (68.4 kg)    Home Care Instructions for the Insertion and Care of Your Vaginal Dilator  Why Do I Need a Vaginal Dilator?  Internal radiation therapy may cause scar tissue to form at the top of your vagina (vaginal cuff).  This may make vaginal examinations difficult in the future. You can prevent scar tissue from forming by using a vaginal dilator (a smooth plastic rod), and/or by having regular sexual intercourse.  If not using the dilator you should be having intercourse two or three times a week.  If you are unable to have intercourse, you should use your vaginal dilator.  You may have some spotting or bleeding from your dilator or intercourse the first few times. You may also have some discomfort. If discomfort occurs with intercourse, you and your partner may need to stop for a while and try again later.  How to Use Your Vaginal Dilator  - Wash the dilator with soap and water before and after each use. - Check the dilator to be sure it is smooth. Do not use the dilator if you find any roughspots. - Coat the dilator with K-Y Jelly, Astroglide, or Replens. Do not use Vaseline, baby oil, or other oil based lubricants. They are not water-soluble and can be irritating to the tissues in the vagina. - Lie on your back with your knees bent and legs apart. - Insert the rounded end of the dilator into your vagina as far as it will go without causing pain or discomfort. - Close your knees and  slowly straighten your legs. - Keep the dilator in your vagina for about 10 to 15 minutes.  Please use 3 times a week, for example: Monday, Wednesday and Friday evenings. [facility] your knees, open your legs, and gently remove the dilator. - Gently cleanse the skin around the vaginal opening. - Wash the dilator after each use. -  It is important that you use the dilator routinely until instructed otherwise by your doctor.    Patient given a small and a small plus dilator with lubricant and provided and reviewed instructions for use. Patient verbalized understanding.

## 2020-08-01 NOTE — Research (Addendum)
NRG-GY020 Arm 1, Week 7 Visit Patient completed radiation treatments on 07/07/20. She comes into clinic today by herself for timed study visit 6 weeks after starting radiation.  PROs; Completed by patient at home prior to visit on 07/30/20. Blood Biospecimens; Blood samples collected this morning prior to MD appointment.  Con Meds; Patient denies any medication changes since last visit. Current medication list is up to date.  VS; Collected prior to MD visit. See VS flowsheet.  H&P, PS and Toxicity assessments; Completed by Dr. Sondra Come today. See provider's note.  Adverse Events;  Nausea-Grade 1- Patient reported intermittent mild nausea starting after treatments 1 and 2 which resolved prior to treatment 3 and has not recurred. Dr. Earney Hamburg states this is probably related to radiation treatments. Diarrhea- Grade 1- Patient reported intermittent diarrhea after treatments 1 and 2, which also resolved prior to treatment 3 and has not recurred. Dr. Earney Hamburg states this is probably related to radiation treatments.  Right Wrist Pain- Grade 2- Patient reports new onset of moderate wrist pain yesterday. Patient is unsure if she injured her wrist taking care of 103 month old grand baby or if there is another cause.  She states she is using some CBD oil and that is helping the pain. Dr. Sondra Come agrees this is not related to radiation treatments.  HTN- Grade 2 - HTN grade 3 reported at beginning of visit before resting. BP retaken at end of visit after resting and patient states "less anxious" and it was grade 2. Patient states she took her amlodipine this morning. Patient also states she checks her blood pressure at home regularly and it is usually normal at home. Patient states it is only high in doctor's office due to anxiety. Instructed patient to keep checking at home several times a week and report to PCP if it remains elevated at home. She may need adjustment of her blood pressure medication. Patient verbalized  understanding. HTN grade 2 was present at baseline and Dr. Sondra Come agrees that this grade 3 HTN is not related to radiation treatments. He states no interventions required. Instructed patient to continue her blood pressure medication and follow up with her PCP to manage. Patient verbalized understanding.   Adverse Events 06/23/2020-08/01/2020  Adverse Event Grade (CTCAE v.5.0) Start Date End Date Radiation Related  Comments  Nausea 1 06/24/20 06/30/20 Probable   Diarrhea 1 06/24/20 06/30/20 Probable   Right Wrist Pain 2 07/31/20 Ongoing Unrelated   Hypertension 3 08/01/20 08/01/20 Unrelated Taken before resting. Down to grade 2 which was present at baseline after resting.    Plan;  Next study visit for week 13 scheduled in 6 weeks on 09/15/20 with MD visit.  Thanked patient for her participation in this clinical trial. Encouraged patient to contact research nurse if any questions or concerns in the meantime. Patient verbalized understanding.  Foye Spurling, BSN, RN Clinical Research Nurse 08/01/2020 10:59 AM

## 2020-08-01 NOTE — Progress Notes (Incomplete)
  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 She did report some mild nausea and mild diarrhea. She denied any significant vaginal soreness, urinary symptoms, vaginal bleeding, and vaginal discharge.  Plan: The patient will follow-up with radiation oncology in one month.  ________________________________________________   Blair Promise, PhD, MD  This document serves as a record of services personally performed by Gery Pray, MD. It was created on his behalf by Clerance Lav, a trained medical scribe. The creation of this record is based on the scribe's personal observations and the provider's statements to them. This document has been checked and approved by the attending provider.

## 2020-08-01 NOTE — Progress Notes (Signed)
Radiation Oncology         [phone] ________________________________  Name: Christy Waller MRN: [id number]  Date: 08/01/2020  DOB: 1971-02-06  Follow-Up Visit Note  NRG-GY020 Protocol  CC: [name], Corinna Lines, MD    ICD-10-CM   1. 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 No significant interval history since the end of treatment.  The patient's blood pressure is elevated but she is on blood pressure medication and checks this regularly at home.  At home readings are normal.  On review of systems, she reports right wrist pain over the last 24 hours. She denies vaginal bleeding, pelvic pain, and any GU symptoms.  She did not experience any urination difficulties bowel complaints vaginal discharge or bleeding after her brachytherapy treatments.  Her energy level is normal  ALLERGIES:  is allergic to lisinopril.  Meds: Current Outpatient Medications  Medication Sig Dispense Refill  . amLODipine (NORVASC) 10 MG tablet Take 10 mg by mouth daily.     . Multiple Vitamin (MULTIVITAMIN WITH MINERALS) TABS tablet Take 1 tablet by mouth daily.    [name] VITAMIN D PO Take 2,000 Units by mouth daily.      No current facility-administered medications for this encounter.    Physical Findings: The patient is in no acute distress. Patient is alert and oriented.  height is 5\' 2"  (1.575 m) and weight is 157 lb 4 oz (71.3 kg). Her temporal temperature is 97.2 F (36.2 C) (abnormal). Her blood pressure is 154/100 (abnormal) and her pulse is 60. Her respiration is 18 and oxygen  saturation is 100%.   Lungs are clear to auscultation bilaterally. Heart has regular rate and rhythm. No palpable cervical, supraclavicular, or axillary adenopathy. Abdomen soft, non-tender, normal bowel sounds. On pelvic exam the external genitalia are unremarkable.  A speculum exam is performed.  There are no mucosal lesions noted in the vaginal vault.  On bimanual examination there are no pelvic masses appreciated.  Vaginal cuff intact.  No appreciable radiation reaction within the vaginal vault.  ECOG = 0  Lab Findings: Lab Results  Component Value Date   WBC 6.4 06/10/2020   HGB 12.7 06/10/2020   HCT 37.8 06/10/2020   MCV 84.4 06/10/2020   PLT 232 06/10/2020    Radiographic Findings: No results found.  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  She tolerated her radiation therapy quite well without any significant side effects.  She has no long-term toxicities at this point.  Blood work from earlier today is pending.  Today the patient was given a vaginal dilator and instructions on its use in light of her vaginal brachytherapy.  She has been cleared at this time to resume sexual intercourse.  Plan: The patient will follow up September 15 2020.  . ____________________________________   Blair Promise, PhD, MD  This document serves as a record of services personally performed by Gery Pray, MD. It was created on his behalf by Clerance Lav, a trained medical scribe. The creation of this  record is based on the scribe's personal observations and the provider's statements to them. This document has been checked and approved by the attending provider.

## 2020-08-11 ENCOUNTER — Ambulatory Visit: Payer: Self-pay | Admitting: Radiation Oncology

## 2020-09-10 ENCOUNTER — Telehealth: Payer: Self-pay | Admitting: *Deleted

## 2020-09-10 NOTE — Telephone Encounter (Signed)
NRG-GY020 Study; Patient confirmed her appointment for Monday 09/16/19 at 11:30 am with Dr. Sondra Come. Informed patient there are no study questionnaires to complete for this visit and research nurse will attend visit with patient. Asked patient to call back if any she needs to change appointment or any questions. She verbalized understanding.  Foye Spurling, BSN, RN Clinical Research Nurse 09/10/2020 2:43 PM

## 2020-09-15 ENCOUNTER — Encounter: Payer: Self-pay | Admitting: Radiation Oncology

## 2020-09-15 ENCOUNTER — Other Ambulatory Visit: Payer: Self-pay

## 2020-09-15 ENCOUNTER — Encounter: Payer: Self-pay | Admitting: *Deleted

## 2020-09-15 ENCOUNTER — Ambulatory Visit
Admission: RE | Admit: 2020-09-15 | Discharge: 2020-09-15 | Disposition: A | Discharge status: Home / Self Care | Payer: 59 | Source: Ambulatory Visit | Attending: Radiation Oncology | Admitting: Radiation Oncology

## 2020-09-15 ENCOUNTER — Other Ambulatory Visit: Payer: 59

## 2020-09-15 DIAGNOSIS — C541 Malignant neoplasm of endometrium: Secondary | ICD-10-CM

## 2020-09-15 DIAGNOSIS — Z006 Encounter for examination for normal comparison and control in clinical research program: Secondary | ICD-10-CM | POA: Diagnosis present

## 2020-09-15 DIAGNOSIS — Z79899 Other long term (current) drug therapy: Secondary | ICD-10-CM | POA: Diagnosis not present

## 2020-09-15 DIAGNOSIS — Z923 Personal history of irradiation: Secondary | ICD-10-CM | POA: Insufficient documentation

## 2020-09-15 DIAGNOSIS — Z8542 Personal history of malignant neoplasm of other parts of uterus: Secondary | ICD-10-CM | POA: Diagnosis present

## 2020-09-15 NOTE — Progress Notes (Signed)
Patient is here today for follow up to radiation to endometrial area completed 06/2020.  Patient reports having pain today but it is associated with a dental issue that she is going to go get looked at today.  Patient denies any urinary issues.  Denies bowel concerns.  Denies vaginal discharge.  Patient reports using the vaginal dilator plus sexual activity at least 3 times a week.    Vitals:   09/15/20 1145  BP: (!) 152/91  Pulse: (!) 58  Resp: 20  Temp: 98.4 F (36.9 C)  SpO2: 100%  Weight: 158 lb 9.6 oz (71.9 kg)  Height: 5\' 2"  (1.575 m)

## 2020-09-15 NOTE — Research (Signed)
NRG-GY020 Arm 1, Week 13 Visit Patient comes into clinic today by herself for timed study visit 12 weeks after starting radiation. Patient changed her last name in our system at registration today.  Patient has legally changed her last name from "Christy Waller" to her new married name "Christy Waller." Hippa Form; Updated Hippa form, [place] IRB approved 09/11/20, reviewed with patient. She denied any questions and signed and dated the new updated form. Patient states she will get copy of signed form on next visit as she did not want to wait for copy to be made today and states not necessary to mail it to her home.  PROs; Not required for this visit.  Blood Biospecimens; Not required for this visit.  Con Meds; Patient denies any medication changes since last visit. Current medication list is up to date.  VS; Collected prior to MD visit. See VS flowsheet.  H&P, PS and Toxicity assessments; Completed by Dr. Sondra Come today. See provider's note.  Adverse Events;  Toothache- Patient reports severe pain right upper tooth which started this morning. She reports the pain as constant aching and throbbing 10/10 on pain scale 1-10.  Patient states she feels like she is about to cry from the pain.  She has not taken anything for the pain because she does not think Ibuprofen or tylenol will help this type of pain. Patient has a dentist appointment this afternoon to evaluate the toothache.  Dr. Earney Hamburg states this is not related to radiation treatment.  Right Wrist Pain- Grade 2- Patient reported moderate wrist pain on previous visit went away a few days after she was last seen. Patient states pain lasted about 3 days altogether and resolved on it's own without any intervention.   Adverse Events 08/01/2020-09/15/20  Adverse Event Grade (CTCAE v.5.0) Start Date End Date Radiation Related  Comments  Right Wrist Pain 2 07/31/20 08/03/20 Unrelated   Toothache 3 09/15/20 Ongoing Unrelated    Plan;  Next study visit for week 25  is due in 12 weeks on 12/08/20 with MD visit and lab. CT scan is due 12/22/20 (+/- 7 days) per protocol. E-mail sent to study data manager to see if CT scan date can be adjusted to be completed day or two before MD visit instead of 2 weeks after MD visit. Will schedule CT scan after we receive guidance from the study about whether or not the timing can be adjusted to coincide with study visit. Informed patient of next visit date to be scheduled and CT scan either before or after next visit. Research nurse will call patient with appointments once they are made. Patient verbalized understanding.  Thanked patient for her participation in this clinical trial. Encouraged patient to contact research nurse if any questions or concerns in the meantime. Patient verbalized understanding.  Foye Spurling, BSN, RN Clinical Research Nurse 09/15/2020 12:34 PM

## 2020-09-15 NOTE — Progress Notes (Signed)
Radiation Oncology         ([phone]) [phone] ________________________________  Name: Christy Waller MRN: [id number]  Date: 09/15/2020  DOB: 06-Jan-1971  Follow-Up Visit Note  NRG-GY020 Protocol  CC: [facility] [place] [facility], Corinna Lines, MD    ICD-10-CM   1. 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 No significant interval history since her last visit except that she was recently married with the name change.  On review of systems, she reports feeling well. She denies abdominal bloating pelvic pain vaginal bleeding or discharge.  She denies any hematuria urinary symptoms or bowel complaints.  She denies any rectal bleeding or problems such as diarrhea.  Initially after returning to sexual intercourse she had some discomfort but none at this time.  She is having sexual intercourse approximately 3 times a week.  She is also using her vaginal dilator 3 times a week..  ALLERGIES:  is allergic to lisinopril.  Meds: Current Outpatient Medications  Medication Sig Dispense Refill  . amLODipine (NORVASC) 10 MG tablet Take 10 mg by mouth daily.     . Multiple Vitamin (MULTIVITAMIN WITH MINERALS) TABS tablet Take 1 tablet by mouth daily.    [name] VITAMIN D PO Take 2,000 Units by mouth daily.      No current facility-administered medications for this encounter.   ECOG =0  Physical Findings: The patient is in no acute distress. Patient is alert and oriented.  height is 5\' 2"  (1.575 m) and weight is 71.9 kg. Her temperature is 98.4 F (36.9 C). Her blood pressure is 152/91 (abnormal) and her  pulse is 58 (abnormal). Her respiration is 20 and oxygen saturation is 100%.   Lungs are clear to auscultation bilaterally. Heart has regular rate and rhythm. No palpable cervical, supraclavicular, or axillary adenopathy. Abdomen soft, non-tender, normal bowel sounds.  The inguinal areas are free of adenopathy  On pelvic exam the external genitalia are unremarkable.  A speculum exam is performed. There are no mucosal lesions noted in the vaginal vault.  On bimanual and examination there are no pelvic masses appreciated.  Vaginal cuff intact.  No appreciable radiation reaction within the vaginal vault.   Lab Findings: Lab Results  Component Value Date   WBC 6.4 06/10/2020   HGB 12.7 06/10/2020   HCT 37.8 06/10/2020   MCV 84.4 06/10/2020   PLT 232 06/10/2020    Radiographic Findings: No results found.  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  Patient does not experience any toxicities from her vaginal brachytherapy.  Plan: The patient will follow up with radiation oncology in 3 months.  She will also undergo a CT scan around this time as per protocol guidelines.   ____________________________________   Blair Promise, PhD, MD  This document serves as a record of services personally performed by Gery Pray, MD. It was created on his behalf by Clerance Lav, a trained medical scribe. The creation of this record is based on the scribe's personal observations and the provider's statements to them.  This document has been checked and approved by the attending provider.

## 2020-09-16 ENCOUNTER — Other Ambulatory Visit: Payer: Self-pay | Admitting: *Deleted

## 2020-09-16 DIAGNOSIS — C541 Malignant neoplasm of endometrium: Secondary | ICD-10-CM

## 2020-09-17 ENCOUNTER — Other Ambulatory Visit: Payer: Self-pay | Admitting: *Deleted

## 2020-09-17 DIAGNOSIS — C541 Malignant neoplasm of endometrium: Secondary | ICD-10-CM

## 2020-09-17 NOTE — Addendum Note (Signed)
Encounter addended by: Gery Pray, MD on: 09/17/2020 1:01 PM  Actions taken: Clinical Note Signed

## 2020-09-25 ENCOUNTER — Telehealth: Payer: Self-pay | Admitting: *Deleted

## 2020-09-25 NOTE — Telephone Encounter (Signed)
NRG-GY020; Informed patient of appointments made for lab and Dr. Earney Hamburg on 12/08/20 starting at 9:30 am.  Informed that CT scan will be scheduled per study protocol for the following week and we will give patient the CT contrast when she comes in on 4/25.  Research nurse will call patient again the week prior to this appointment to confirm if patient was able to complete the study questionnaires on her phone for this visit. Asked patient to contact research nurse if she is unable to make this appointment for any reason. Patient verbalized understanding.  Foye Spurling, BSN, RN Clinical Research Nurse 09/25/2020 1:02 PM

## 2020-12-02 ENCOUNTER — Telehealth: Payer: Self-pay | Admitting: *Deleted

## 2020-12-02 NOTE — Telephone Encounter (Signed)
[place] GY020: Called patient to confirm study appointments next week and she reported that she is going out of town next week for her uncle's funeral.  Liana Gerold to reschedule appt to this Friday 12/05/20 to stay within protocol defined assessment window but patient states she has to work and would not be able to come in this Friday.  Patient does not know when she will be returning from out of town yet as she has just learned of uncle's passing and is still making plans. Patient agreed for research nurse to contact her again at the end of this week to work on rescheduling the visit. Offered my condolences on her loss and thanked patient for her time. Plan to call again on Friday to work on scheduling appointment.  Foye Spurling, BSN, RN Clinical Research Nurse 12/02/2020

## 2020-12-05 ENCOUNTER — Telehealth: Payer: Self-pay | Admitting: *Deleted

## 2020-12-05 ENCOUNTER — Inpatient Hospital Stay: Payer: 59 | Attending: Radiation Oncology

## 2020-12-05 ENCOUNTER — Other Ambulatory Visit: Payer: Self-pay

## 2020-12-05 DIAGNOSIS — C541 Malignant neoplasm of endometrium: Secondary | ICD-10-CM | POA: Diagnosis not present

## 2020-12-05 LAB — BUN & CREATININE (CHCC)
BUN: 14 mg/dL (ref 6–20)
Creatinine: 0.77 mg/dL (ref 0.44–1.00)
GFR, Estimated: >60 mL/min (ref >60)

## 2020-12-05 NOTE — Telephone Encounter (Signed)
[place] GY020- Called patient to follow up on rescheduling study visit and CT scan.  Patient states due to being out of town and her work schedule the soonest she is available for appointment now is May 3rd in the afternoon (after 2 pm).  Her next day off is not until May 17th.    Dr. Clabe Seal scheduler tentatively scheduled patient for May 3rd at 3:00 pm but she will need to confirm with Dr. Sondra Come next week when he returns to the office. Patient agreed to CT scan late afternoon/early evening on May 2nd and this was scheduled for 12/15/20 at 5:30 pm. Informed patient of tentative d/t to see Dr. Sondra Come on 5/3 at 3:00 pm and CT scan on 5/2 at 5:30 pm.  Informed patient she needs labs done prior to CT and will need to pick up Contrast. Patient states she can come this afternoon at 3 pm for lab and to get contrast.  Thanked patient for her time and will give her the CT Contrast and instructions when she comes in for her lab this afternoon. Patient verbalized understanding.  Asked patient to complete questionnaires on the phone as soon as they become available to her as she does not need to wait for her appointments to complete. Patient verbalized understanding. Thanked patient for her time. Foye Spurling, BSN, RN Clinical Research Nurse 12/05/2020 10:44 AM

## 2020-12-05 NOTE — Telephone Encounter (Signed)
Patient came into clinic for her lab work required prior to CT Scan. Research nurse gave patient the CT contrast bottles and written instructions for her CT scan on 12/15/20 (NPO 4 hrs prior to scan, drink one bottle at 3:30 pm and the other at 4:30 pm).  Wrote my phone number on the instructions and asked patient to call if any questions or problems. She verbalized understanding.  Foye Spurling, BSN, RN Clinical Research Nurse 12/05/2020 2:55 PM

## 2020-12-08 ENCOUNTER — Encounter: Payer: Self-pay | Admitting: Radiation Oncology

## 2020-12-08 ENCOUNTER — Ambulatory Visit: Payer: Self-pay | Admitting: Radiation Oncology

## 2020-12-08 ENCOUNTER — Ambulatory Visit: Payer: 59

## 2020-12-10 ENCOUNTER — Encounter: Payer: Self-pay | Admitting: Radiology

## 2020-12-10 ENCOUNTER — Telehealth: Payer: Self-pay | Admitting: *Deleted

## 2020-12-10 NOTE — Telephone Encounter (Signed)
[place] GY020; Called patient to confirm if she was able to complete the study questionnaires on her phone. Patient states she completed them on Friday 12/05/20. Thanked patient for completing and reminded her about CT scan appt and Dr. Sondra Come next week. She verbalized understanding.  Foye Spurling, BSN, RN Clinical Research Nurse 12/10/2020 1:27 PM

## 2020-12-11 ENCOUNTER — Ambulatory Visit: Payer: 59 | Admitting: Radiation Oncology

## 2020-12-11 ENCOUNTER — Ambulatory Visit: Payer: 59

## 2020-12-12 ENCOUNTER — Telehealth: Payer: Self-pay

## 2020-12-12 DIAGNOSIS — C541 Malignant neoplasm of endometrium: Secondary | ICD-10-CM

## 2020-12-12 NOTE — Telephone Encounter (Signed)
NRG-GY020 RADIATION +/- REMBROLIZUMAB FOR EARLY STAGE dMMR ENDOMETRIAL CANCER  OUTGOING CALL: Outgoing call: no answer, voicemail message left informing patient that her CT scan is rescheduled for Monday, 12/15/20 at 5:30pm per her original request. My direct call back number is provided and patient is encouraged to call me for any questions.  NOTE: Per notes, patient has already received CT contrast and written instructions for use.  Current plan is for patient to have CT as scheduled on 12/15/20 at 5:30pm.  Dionne Bucy. Sharlett Iles, BSN, RN, CIC 12/12/2020 12:32 PM

## 2020-12-15 ENCOUNTER — Ambulatory Visit (HOSPITAL_COMMUNITY)
Admission: RE | Admit: 2020-12-15 | Discharge: 2020-12-15 | Disposition: A | Discharge status: Home / Self Care | Payer: 59 | Source: Ambulatory Visit | Attending: Radiation Oncology | Admitting: Radiation Oncology

## 2020-12-15 ENCOUNTER — Ambulatory Visit (HOSPITAL_COMMUNITY): Payer: 59

## 2020-12-15 ENCOUNTER — Other Ambulatory Visit: Payer: Self-pay

## 2020-12-15 ENCOUNTER — Other Ambulatory Visit (HOSPITAL_COMMUNITY): Payer: Self-pay | Admitting: Student

## 2020-12-15 DIAGNOSIS — C541 Malignant neoplasm of endometrium: Secondary | ICD-10-CM | POA: Diagnosis not present

## 2020-12-15 MED ORDER — IOHEXOL 300 MG/ML  SOLN
100.0000 mL | Freq: Once | INTRAMUSCULAR | Status: AC | PRN
  Administered 2020-12-15: 100 mL via INTRAVENOUS

## 2020-12-16 ENCOUNTER — Ambulatory Visit: Payer: 59 | Admitting: Radiation Oncology

## 2020-12-16 ENCOUNTER — Telehealth: Payer: Self-pay | Admitting: *Deleted

## 2020-12-16 NOTE — Telephone Encounter (Signed)
Called patient to ask which day that she would like to come for fu, patient agreed to come on 12-23-20 @ 8 am

## 2020-12-22 NOTE — Progress Notes (Signed)
Radiation Oncology         (336) [phone] ________________________________  Name: Christy Waller MRN: [id number]  Date: 12/23/2020  DOB: Mar 01, 1971  Follow-Up Visit Note  NRG-GY020 Protocol  CC: [name] @ [address], [place]   1. 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 Since her last visit, she underwent Ct of chest / abdomen / pelvis showing no evidence of recurrent or metastatic disease.  On review of systems, she reports feeling well. She denies abdominal bloating pelvic pain vaginal bleeding or discharge.  She denies any hematuria urinary symptoms or bowel complaints.  She denies any rectal bleeding or problems such as diarrhea.  Initially after returning to sexual intercourse she had some discomfort but none at this time.  She is having sexual intercourse approximately 3 times a week.  She is also using her vaginal dilator 3 times a week..  She reports no postcoital bleeding.  ALLERGIES:  is allergic to lisinopril.  Meds: Current Outpatient Medications  Medication Sig Dispense Refill  . amLODipine (NORVASC) 10 MG tablet Take 10 mg by mouth daily.     . Multiple Vitamin (MULTIVITAMIN WITH MINERALS) TABS tablet Take 1 tablet by mouth daily.    [name] VITAMIN D PO Take 2,000 Units by mouth daily.     . fluticasone (FLONASE) 50 MCG/ACT nasal spray Place 1 spray into both nostrils daily as needed for allergies or rhinitis.     No current facility-administered medications for this encounter.   ECOG =0  Physical Findings: The patient is in no acute distress. Patient  is alert and oriented.  height is 5\' 2"  (1.575 m) and weight is 158 lb (71.7 kg). Her temperature is 97.8 F (36.6 C). Her blood pressure is 157/82 (abnormal) and her pulse is 56 (abnormal). Her respiration is 20 and oxygen saturation is 100%.   Lungs are clear to auscultation bilaterally. Heart has regular rate and rhythm. No palpable cervical, supraclavicular, or axillary adenopathy. Abdomen soft, non-tender, normal bowel sounds.  The inguinal areas are free of adenopathy  On pelvic exam the external genitalia are unremarkable.  A speculum exam is performed. There are no mucosal lesions noted in the vaginal vault.  On bimanual and rectovaginal examination there are no pelvic masses appreciated.  Vaginal cuff intact.  No appreciable radiation reaction within the vaginal vault.   Lab Findings: Lab Results  Component Value Date   WBC 6.4 06/10/2020   HGB 12.7 06/10/2020   HCT 37.8 06/10/2020   MCV 84.4 06/10/2020   PLT 232 06/10/2020    Radiographic Findings: CT CHEST ABDOMEN PELVIS W CONTRAST  Result Date: 12/16/2020 CLINICAL DATA:  History of endometrial cancer, surveillance. Status post surgery and radiation completed 06/2020 EXAM: CT CHEST, ABDOMEN, AND PELVIS WITH CONTRAST TECHNIQUE: Multidetector CT imaging of the chest, abdomen and pelvis was performed following the standard protocol during bolus administration of intravenous contrast. CONTRAST:  154mL OMNIPAQUE IOHEXOL 300 MG/ML  SOLN COMPARISON:  CT chest abdomen pelvis June 10, 2020. FINDINGS: CT CHEST FINDINGS Cardiovascular: No thoracic aortic aneurysm. No central pulmonary embolus. Mildly enlarged  heart. No significant pericardial effusion/thickening. Mediastinum/Nodes: Similar prominent bilateral axillary lymph nodes not pathologically enlarged by size criteria. No pathologically enlarged mediastinal or hilar lymph nodes. No discrete thyroid nodularity. Trachea esophagus are grossly unremarkable. Lungs/Pleura: Lungs are clear. No  pleural effusion or pneumothorax. Musculoskeletal: Dextroconvex thoracic scoliosis. Similar nonspecific sclerosis in the left pedicle and adjacent vertebral body at T10 and to a lesser extent at the T11 pedicle, likely degenerative. CT ABDOMEN PELVIS FINDINGS Hepatobiliary: No significant change in size of the hyperenhancing segment 7 focus measuring 1.3 cm on image 42/2 previously 1.2 cm likely representing a flash filling hemangioma but technically nonspecific. Unchanged size of the hypodense 4 mm focus in the right lobe of the liver on image 45/2 which is technically too small to accurately characterize and statistically likely to represent a cyst or other benign etiology but technically indeterminate. Gallbladder is unremarkable.  No biliary ductal dilation. Pancreas: Within normal limits. Spleen: 6 mm hypodense lesion in the spleen on image 47/2 appears similar to prior and favored represent a benign hemangioma/lymphangioma. Adrenals/Urinary Tract: Adrenal glands are unremarkable. Kidneys are normal, without renal calculi, focal lesion, or hydronephrosis. Bladder is unremarkable. Stomach/Bowel: Stomach is within normal limits. Appendix appears normal. No evidence of bowel wall thickening, distention, or inflammatory changes. Vascular/Lymphatic: Prominent pelvic lymph nodes are again visualized not pathologically enlarged by size criteria. For reference left external iliac lymph node measuring 6 mm on image 95/2 previously measuring 7 mm. Right external iliac lymph node on image 98/2 measuring 8 mm previously measuring 7 mm. No pathologically enlarged abdominal lymph nodes. No abdominal aortic aneurysm. Reproductive: The uterus and bilateral ovaries are surgically absent. No suspicious enhancing soft tissue nodularity along the vaginal cuff. Other: Again seen are the scattered small subcutaneous nodules prior that an early located along the posterior upper abdominal wall, and not significantly changed from  prior. Musculoskeletal: Sclerosis along the left greater than right sacroiliac joints predominately along the iliac side with sclerosis of the pubic bones, both appearing unchanged. 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 Lower lumbar predominant degenerative change. No aggressive lytic or blastic lesion of bone IMPRESSION: 1. Status post hysterectomy and bilateral ovaries without evidence of recurrent or metastatic disease within the chest, abdomen, or pelvis. 2. Stable hyperenhancing segment 7 hepatic focus and 4 mm hypodense hepatic focus, likely representing a flash filling hemangioma and benign hepatic cyst respectively. However both of which are technically nonspecific/indeterminate, continued attention on follow-up imaging is recommended. 3. Stable size and appearance of the scattered small subcutaneous nodules along the posterior upper abdominal wall, likely small foci of subcutaneous inflammation. 4. Sclerosis along the left greater than right sacroiliac joints predominately along the iliac side with sclerosis of the pubic bones, both appearing unchanged. 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  She  does not experience any toxicities from her vaginal brachytherapy.  CT scans of the chest abdomen and pelvis also showed no evidence of recurrence  Plan: The patient will follow up with radiation oncology in 3 months on March 02, 2021.  ____________________________________   Blair Promise, PhD, MD  This document serves as a record of services personally performed by Gery Pray, MD. It was created on his behalf by Roney Mans, a trained medical scribe. The creation  of this record is based on the scribe's personal observations and the  provider's statements to them. This document has been checked and approved by the attending provider.

## 2020-12-23 ENCOUNTER — Ambulatory Visit
Admission: RE | Admit: 2020-12-23 | Discharge: 2020-12-23 | Disposition: A | Discharge status: Home / Self Care | Payer: 59 | Source: Ambulatory Visit | Attending: Radiation Oncology | Admitting: Radiation Oncology

## 2020-12-23 ENCOUNTER — Encounter: Payer: Self-pay | Admitting: *Deleted

## 2020-12-23 ENCOUNTER — Encounter: Payer: Self-pay | Admitting: Radiation Oncology

## 2020-12-23 ENCOUNTER — Other Ambulatory Visit: Payer: Self-pay

## 2020-12-23 DIAGNOSIS — Z79899 Other long term (current) drug therapy: Secondary | ICD-10-CM | POA: Insufficient documentation

## 2020-12-23 DIAGNOSIS — Z923 Personal history of irradiation: Secondary | ICD-10-CM | POA: Insufficient documentation

## 2020-12-23 DIAGNOSIS — I517 Cardiomegaly: Secondary | ICD-10-CM | POA: Diagnosis not present

## 2020-12-23 DIAGNOSIS — Z006 Encounter for examination for normal comparison and control in clinical research program: Secondary | ICD-10-CM | POA: Insufficient documentation

## 2020-12-23 DIAGNOSIS — Z8542 Personal history of malignant neoplasm of other parts of uterus: Secondary | ICD-10-CM | POA: Diagnosis present

## 2020-12-23 DIAGNOSIS — C541 Malignant neoplasm of endometrium: Secondary | ICD-10-CM

## 2020-12-23 NOTE — Research (Signed)
NRG-GY020 Arm 1, Week 25 Visit Patient comes into clinic today by herself for timed study visit 25 weeks post starting radiation. This visit was due on 12/08/20. It was rescheduled to 12/16/20 outside of protocol defined visit window due to patient being out of town for her [place]. It was then delayed further to today 12/23/20 due to patient's work schedule. Dr. Sondra Come is aware this visit is outside of protocol defined visit window and states the delay in visit did not cause any harm and did not place patient at any risk of harm.  [place] Form; Copy of signed updated [place] form from previous visit given to patient today for her records.   PROs; Patient completed EPRO on her phone 12/05/20.   Blood Biospecimens; Not required for this visit.  Con Meds; Patient denies any new medications since last visit. Patient reports the Asencion Islam is missing from her list and she has been taking it since last September PRN allergy symptoms. Flonase added to medication list.  VS; Collected prior to MD visit. See VS flowsheet.  H&P, PS and Toxicity assessments; Completed by Dr. Sondra Come today. See provider's note.  Adverse Events;  Patient denies any new symptoms or problems since last visit except for "allergy" symptoms for which she uses Flonase PRN.  Patient states she has suffered from seasonal allergies most of her adult life. This is not a new condition.  Toothache- Patient reports tooth ache resolved after she saw the dentist after her last visit.     Adverse Events 09/15/20-12/23/20  Adverse Event Grade (CTCAE v.5.0) Start Date End Date Radiation Related  Comments  Toothache 3 09/15/20 09/16/20 Unrelated    Plan;  Next study visit for week 47 is due on 03/02/21 with MD visit. Gave patient dates for next 2 study visits due on 03/02/21 and 05/25/21 so she can arrange her work schedule around these visits.   Thanked patient for her participation in this clinical trial. Encouraged patient to contact research nurse if any  questions or concerns in the meantime. Patient verbalized understanding.  Foye Spurling, BSN, RN Clinical Research Nurse 12/23/2020 8:45 AM

## 2020-12-23 NOTE — Progress Notes (Signed)
Christy Waller is here today for follow up post radiation to the pelvic.  They completed their radiation on: 07/07/20  Does the patient complain of any of the following:  . Pain Patient denies pain. . Abdominal bloating: no . Diarrhea/Constipation: no . Nausea/Vomiting: no . Vaginal Discharge: no . Blood in Urine or Stool: no . Urinary Issues (dysuria/incomplete emptying/ incontinence/ increased frequency/urgency): Patient denies any urinary issues.  . Does patient report using vaginal dilator 2-3 times a week and/or sexually active 2-3 weeks: Yes . Post radiation skin changes: No   Additional comments if applicable: Vitals:   10/62/69 0812  BP: (!) 157/82  Pulse: (!) 56  Resp: 20  Temp: 97.8 F (36.6 C)  SpO2: 100%  Weight: 158 lb (71.7 kg)  Height: 5\' 2"  (1.575 m)

## 2020-12-25 ENCOUNTER — Telehealth: Payer: Self-pay | Admitting: *Deleted

## 2020-12-25 NOTE — Telephone Encounter (Signed)
[place] GY020; Informed patient of d/t of next 2 study visits on 03/02/21 and 05/25/21. She confirmed. Asked patient to call research nurse if any problems with keeping appointments. She verbalized understanding.  Foye Spurling, BSN, RN Clinical Research Nurse 12/25/2020 4:22 PM

## 2021-01-13 ENCOUNTER — Ambulatory Visit (INDEPENDENT_AMBULATORY_CARE_PROVIDER_SITE_OTHER): Payer: 59 | Admitting: Family Medicine

## 2021-01-13 ENCOUNTER — Encounter: Payer: Self-pay | Admitting: Family Medicine

## 2021-01-13 ENCOUNTER — Other Ambulatory Visit: Payer: Self-pay

## 2021-01-13 VITALS — BP 132/84 | Temp 97.3°F | Ht 62.0 in | Wt 155.6 lb

## 2021-01-13 DIAGNOSIS — I10 Essential (primary) hypertension: Secondary | ICD-10-CM

## 2021-01-13 DIAGNOSIS — C541 Malignant neoplasm of endometrium: Secondary | ICD-10-CM | POA: Diagnosis not present

## 2021-01-13 NOTE — Progress Notes (Signed)
Provider:  Alain Honey, MD  Careteam: Patient Care Team: Wardell Honour, MD as PCP - General (Family Medicine)  PLACE OF SERVICE:  [facility]  Advanced Directive information    Allergies  Allergen Reactions  . Lisinopril Swelling    Angioedema    Chief Complaint  Patient presents with  . New Patient (Initial Visit)    Patient presents today for new patient appointment.     HPI: Patient is a 50 y.o. female patient with history of endometrial cancer and hysterectomy and November.  She received 5 radiation treatments and is followed for that.  Apparently had some symptoms with vaginal bleeding that was not evaluated prior to her surgery. Complains of respiratory allergies.  She takes Flonase with some relief of symptoms. Only medical problem current dose hypertension and taking amlodipine 10 mg.  Review of Systems:  Review of Systems  HENT: Positive for congestion.   Eyes: Negative.   Gastrointestinal: Positive for heartburn.  Genitourinary: Negative.   Musculoskeletal: Negative.   Neurological: Negative.   All other systems reviewed and are negative.   Past Medical History:  Diagnosis Date  . Anemia 2013   Iron Deficiency Anemia  . Bradycardia 03/2012   Event monitor 8/13: NSR, sinus bradycardia, sinus tachycardia, no significant bradycardia arrhythmias  . Chest pain 03/22/2012   Normal Myoview, normal echo, chest CT negative for pulmonary embolism  . Family history of breast cancer 06/17/2020  . History of colonoscopy   . History of CT scan 2021   Blair Promise, MD  . History of nuclear stress test 03/2012   Myoview 03/23/12: EF 50%, no ischemia or scar.  [name] History of radiation therapy 06/23/20-07/07/20   vaginal brachytherapy - [facility]DR- Endometrium, Dr. Sondra Come  . HTN (hypertension) 03/2012   2-D echocardiogram 03/22/12: EF 50%, mild LAE, no wall motion abnormalities.  . Tubal pregnancy aprox 1998   In her late 6s  . Vitamin D deficiency 2013   Past  Surgical History:  Procedure Laterality Date  . ABDOMINAL HYSTERECTOMY  2021   Blair Promise, MD, PhD  . CESAREAN SECTION     x2  . ECTOPIC PREGNANCY SURGERY    . ROBOTIC ASSISTED TOTAL HYSTERECTOMY WITH BILATERAL SALPINGO OOPHERECTOMY N/A 05/01/2020   Procedure: XI ROBOTIC ASSISTED TOTAL HYSTERECTOMY WITH BILATERAL SALPINGO OOPHORECTOMY AND MINI LAPAROTOMY;  Surgeon: [name], MD;  Location: [facility];  Service: Gynecology;  Laterality: N/A;  . SENTINEL NODE BIOPSY N/A 05/01/2020   Procedure: SENTINEL NODE BIOPSY;  Surgeon: [name], MD;  Location: [facility];  Service: Gynecology;  Laterality: N/A;  . TUBAL LIGATION     Social History:   reports that she quit smoking about 4 years ago. Her smoking use included cigarettes. She has a 5.00 pack-year smoking history. She has never used smokeless tobacco. She reports current alcohol use. She reports current drug use. Drug: Marijuana.  Family History  Problem Relation Age of Onset  . Diabetes type II Sister   . Hypertension Mother   . Hypertension Father   . Hypertension Sister   . Breast cancer Maternal Aunt        dx 50s  . Breast cancer Cousin        maternal cousin; dx 41s  . Breast cancer Cousin        dx early 46s  . Hypertension Daughter     Medications: Patient's Medications  New Prescriptions   No medications on file  Previous Medications  AMLODIPINE (NORVASC) 10 MG TABLET    Take 10 mg by mouth daily.    FLUTICASONE (FLONASE) 50 MCG/ACT NASAL SPRAY    Place 1 spray into both nostrils daily as needed for allergies or rhinitis.   VITAMIN D PO    Take 5,000 Units by mouth daily.  Modified Medications   No medications on file  Discontinued Medications   No medications on file    Physical Exam:  There were no vitals filed for this visit. There is no height or weight on file to calculate BMI. Wt Readings from Last 3 Encounters:  12/23/20 158 lb (71.7 kg)  09/15/20 158 lb 9.6 oz (71.9 kg)  08/01/20  157 lb 4 oz (71.3 kg)    Physical Exam Vitals and nursing note reviewed.  Constitutional:      Appearance: Normal appearance.  HENT:     Head: Normocephalic.     Right Ear: Tympanic membrane normal.     Left Ear: Tympanic membrane normal.     Nose: Nose normal.     Mouth/Throat:     Mouth: Mucous membranes are dry.  Cardiovascular:     Rate and Rhythm: Normal rate.  Pulmonary:     Effort: Pulmonary effort is normal.     Breath sounds: Normal breath sounds.  Musculoskeletal:        General: Normal range of motion.     Cervical back: Normal range of motion.  Skin:    General: Skin is warm.  Neurological:     General: No focal deficit present.     Mental Status: She is alert and oriented to person, place, and time.  Psychiatric:        Mood and Affect: Mood normal.        Behavior: Behavior normal.        Thought Content: Thought content normal.        Judgment: Judgment normal.     Labs reviewed: Basic Metabolic Panel: Recent Labs    04/28/20 1138 06/10/20 1349 06/10/20 1350 12/05/20 1430  NA 141  --  140  --   K 3.9  --  4.0  --   CL 104  --  104  --   CO2 26  --  29  --   GLUCOSE 93  --  85  --   BUN 13  --  14 14  CREATININE 0.64  --  0.77 0.77  CALCIUM 10.2  --  10.2  --   TSH  --  1.140  --   --    Liver Function Tests: Recent Labs    04/28/20 1138 06/10/20 1350  AST 20 17  ALT 13 12  ALKPHOS 81 96  BILITOT 0.4 0.5  PROT 7.8 7.8  ALBUMIN 4.3 4.2   No results for input(s): LIPASE, AMYLASE in the last 8760 hours. No results for input(s): AMMONIA in the last 8760 hours. CBC: Recent Labs    04/28/20 1138 06/10/20 1350  WBC 9.8 6.4  NEUTROABS  --  3.6  HGB 13.2 12.7  HCT 39.7 37.8  MCV 84.6 84.4  PLT 288 232   Lipid Panel: No results for input(s): CHOL, HDL, LDLCALC, TRIG, CHOLHDL, LDLDIRECT in the last 8760 hours. TSH: Recent Labs    06/10/20 1349  TSH 1.140   A1C: Lab Results  Component Value Date   HGBA1C 5.1 04/28/2020      Assessment/Plan  1. Endometrial cancer ([place]) Followed by radiation oncology.  Doing well  2. Primary hypertension Blood pressure well controlled.  No side effects such as edema  Alain Honey, MD [place] [phone]

## 2021-01-14 ENCOUNTER — Other Ambulatory Visit: Payer: Self-pay | Admitting: *Deleted

## 2021-01-14 DIAGNOSIS — I10 Essential (primary) hypertension: Secondary | ICD-10-CM

## 2021-01-14 MED ORDER — AMLODIPINE BESYLATE 10 MG PO TABS: 10.0000 mg | ORAL_TABLET | Freq: Every day | ORAL | 1 refills | Status: DC

## 2021-01-14 NOTE — Telephone Encounter (Signed)
Patient called and stated that they were seen yesterday but blood pressure medication was not sent to pharmacy and she needs a refill.

## 2021-02-23 ENCOUNTER — Telehealth: Payer: Self-pay | Admitting: *Deleted

## 2021-02-23 NOTE — Telephone Encounter (Signed)
[place] GY020 Study; Reminded patient of her study visit appointment with Dr. Sondra Come next week on Monday 7/18 at 10:00 am. Informed patient that this research nurse will be out of the office that day and another member of our research team will meet with her briefly before she sees Dr. Sondra Come. Asked patient to call if any questions or problems with this appointment. Patient verbalized understanding. Thanked patient for her time.  Foye Spurling, BSN, RN Clinical Research Nurse 02/23/2021 12:49 PM

## 2021-03-02 ENCOUNTER — Ambulatory Visit
Admission: RE | Admit: 2021-03-02 | Discharge: 2021-03-02 | Disposition: A | Discharge status: Home / Self Care | Payer: 59 | Source: Ambulatory Visit | Attending: Radiation Oncology | Admitting: Radiation Oncology

## 2021-03-02 ENCOUNTER — Telehealth: Payer: Self-pay | Admitting: *Deleted

## 2021-03-02 NOTE — Telephone Encounter (Signed)
Called patient to ask about rescheduling this fu, lvm for a return call

## 2021-03-04 ENCOUNTER — Telehealth: Payer: Self-pay | Admitting: *Deleted

## 2021-03-04 NOTE — Progress Notes (Addendum)
Radiation Oncology         [phone] ________________________________  Name: Christy Waller MRN: [id number]  Date: 03/05/2021  DOB: [birth year]  Follow-Up Visit Note  NRG-GY020 Protocol  CC: Wardell Honour, MD  College, [facility]    ICD-10-CM   1. 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       On review of systems, she reports feeling well. She denies abdominal bloating pelvic pain vaginal bleeding or discharge.  She denies any hematuria urinary symptoms or bowel complaints.  She occasionally will notice some mild urinary urgency but no incontinence. she denies any rectal bleeding or problems such as diarrhea.  Initially after returning to sexual intercourse she had some discomfort but none at this time.  She is having sexual intercourse approximately 3 times a week.  She is also using her vaginal dilator 3 times a week..  She reports no postcoital bleeding.  She occasionally will have some lower back pain after working long periods.  This has been a chronic problem for her.               Allergies:  is allergic to lisinopril.  Meds: Current Outpatient Medications  Medication Sig Dispense Refill   amLODipine (NORVASC) 10 MG tablet Take 1 tablet (10 mg total) by mouth daily. 90 tablet 1   fluticasone (FLONASE) 50 MCG/ACT nasal spray Place 1 spray into both nostrils daily as needed for allergies or rhinitis.     Multiple Vitamin (MULTIVITAMIN) tablet Take 1 tablet by mouth daily.     VITAMIN D PO Take 5,000 Units by mouth daily.     No current  facility-administered medications for this encounter.   ECOG =0  Physical Findings: The patient is in no acute distress. Patient is alert and oriented.  height is 5\' 2"  (1.575 m) and weight is 155 lb 6 oz (70.5 kg). Her temporal temperature is 96.8 F (36 C) (abnormal). Her blood pressure is 153/92 (abnormal) and her pulse is 58 (abnormal). Her respiration is 18 and oxygen saturation is 100%. .  No significant changes. Lungs are clear to auscultation bilaterally. Heart has regular rate and rhythm. No palpable cervical, supraclavicular, or axillary adenopathy. Abdomen soft, non-tender, normal bowel sounds.   On pelvic exam the external genitalia are unremarkable.  A speculum exam is performed. There are no mucosal lesions noted in the vaginal vault.  On bimanual and rectovaginal examination there are no pelvic masses appreciated.  Vaginal cuff intact.  No appreciable radiation reaction within the vaginal vault.  Rectal sphincter tone normal.   Lab Findings: Lab Results  Component Value Date   WBC 6.4 06/10/2020   HGB 12.7 06/10/2020   HCT 37.8 06/10/2020   MCV 84.4 06/10/2020   PLT 232 06/10/2020    Radiographic Findings: No results found.  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  She  does not experience any toxicities from her vaginal brachytherapy.  CT scans of the  chest abdomen and pelvis also showed no evidence of recurrence.  Plan: Follow-up in 3 months on October 10.   20 minutes of total time was spent for this patient encounter, including preparation, face-to-face counseling with the patient and coordination of care, physical exam, and documentation of the encounter. ____________________________________  Blair Promise, PhD, MD   This document serves as a record of services personally performed by Gery Pray, MD. It was created on his behalf by Roney Mans, a trained  medical scribe. The creation of this record is based on the scribe's personal observations and the provider's statements to them. This document has been checked and approved by the attending provider.

## 2021-03-04 NOTE — Telephone Encounter (Signed)
NRG-GY020 Arm 1, Week 37 Visit Patient study visit was due and scheduled for 03/02/21 for the 37 weeks post starting radiation assessment. Unfortunately patient states she forgot and she did not make this appointment. This visit was rescheduled to 03/05/21 per patient's availability and will be 3 days outside of protocol defined visit window. Dr. Sondra Come is aware this visit will be outside of protocol defined visit window and states the delay in visit did not place patient at any risk of harm or affect the integrity of the study. This research nurse will be out of office tomorrow so called patient today and reviewed Con Meds and AEs by phone. Informed patient that this research nurse will not see patient tomorrow when she comes in for her appointment with Dr. Sondra Come. Encouraged her to call research nurse with any questions after the visit and I can return her call on Friday. She verbalized understanding and confirmed her visit with Dr. Sondra Come tomorrow at 10:45 am.  PROs; Not required at this time point.  Con Meds; Reviewed current medication list with patient and she denies any new medications since last visit. Multivitamin was removed from her medication list in EMR since last visit although patient confirmed she continues to take daily.  Added back to medication list.   VS; Scheduled for tomorrow.  H&P, PS and Toxicity assessments; Scheduled for tomorrow visit with Dr. Sondra Come.  Adverse Events;  Patient denies any new symptoms or problems since last visit. She is working and staying very busy with no new complaints.     Adverse Events 12/23/20-03/04/21   Adverse Event Grade (CTCAE v.5.0) Start Date End Date Radiation Related  Comments  None reported          Plan;  Next study visit for week 49 is due on 05/25/21 and already scheduled. Patient aware of appointment d/t.    Thanked patient for her participation in this clinical trial. Encouraged patient to contact research nurse if any questions or concerns  in the meantime. Patient verbalized understanding. Foye Spurling, BSN, RN Clinical Research Nurse 03/04/2021 11:23 AM

## 2021-03-05 ENCOUNTER — Ambulatory Visit: Payer: 59 | Admitting: Radiation Oncology

## 2021-03-05 ENCOUNTER — Other Ambulatory Visit: Payer: Self-pay

## 2021-03-05 ENCOUNTER — Encounter: Payer: Self-pay | Admitting: Radiation Oncology

## 2021-03-05 ENCOUNTER — Ambulatory Visit
Admission: RE | Admit: 2021-03-05 | Discharge: 2021-03-05 | Disposition: A | Discharge status: Home / Self Care | Payer: 59 | Source: Ambulatory Visit | Attending: Radiation Oncology | Admitting: Radiation Oncology

## 2021-03-05 VITALS — BP 153/92 | HR 58 | Temp 96.8°F | Resp 18 | Ht 62.0 in | Wt 155.4 lb

## 2021-03-05 DIAGNOSIS — C541 Malignant neoplasm of endometrium: Secondary | ICD-10-CM | POA: Insufficient documentation

## 2021-03-05 DIAGNOSIS — Z006 Encounter for examination for normal comparison and control in clinical research program: Secondary | ICD-10-CM | POA: Diagnosis present

## 2021-03-05 NOTE — Progress Notes (Signed)
Christy Waller is here today for follow up post radiation to the pelvic.  They completed their radiation on: 07/07/2020   Does the patient complain of any of the following:  Pain:5/10 low back pain Abdominal bloating: denies Diarrhea/Constipation: denies Nausea/Vomiting: denies Vaginal Discharge: denies Blood in Urine or Stool: denies Urinary Issues (dysuria/incomplete emptying/ incontinence/ increased frequency/urgency): occasional mild incontinence, Urgency, frequency, possible incomplete bladder emptying Does patient report using vaginal dilator 2-3 times a week and/or sexually active 2-3 weeks: yes Post radiation skin changes: has returned to baseline   Additional comments if applicable: none  Vitals:   03/05/21 1035  BP: (!) 153/92  Pulse: (!) 58  Resp: 18  Temp: (!) 96.8 F (36 C)  TempSrc: Temporal  SpO2: 100%  Weight: 155 lb 6 oz (70.5 kg)  Height: 5\' 2"  (1.575 m)

## 2021-03-05 NOTE — Addendum Note (Signed)
Encounter addended by: Gery Pray, MD on: 03/05/2021 6:06 PM  Actions taken: Clinical Note Signed

## 2021-03-05 NOTE — Addendum Note (Signed)
Encounter addended by: Gery Pray, MD on: 03/05/2021 6:05 PM  Actions taken: Clinical Note Signed

## 2021-03-09 ENCOUNTER — Ambulatory Visit: Payer: 59 | Admitting: Radiation Oncology

## 2021-05-06 ENCOUNTER — Telehealth: Payer: Self-pay | Admitting: *Deleted

## 2021-05-06 NOTE — Telephone Encounter (Signed)
Patient called to update [place] with her new cell phone number. Updated in our EMR and patient also confirmed her next study visit scheduled for 05/25/21 at 10:00 am. Foye Spurling, BSN, RN Clinical Research Nurse 05/06/2021 1:02 PM

## 2021-05-22 ENCOUNTER — Telehealth: Payer: Self-pay | Admitting: *Deleted

## 2021-05-22 DIAGNOSIS — C541 Malignant neoplasm of endometrium: Secondary | ICD-10-CM

## 2021-05-22 NOTE — Telephone Encounter (Signed)
Patient confirmed her appointment for Monday 05/25/21 at 10 am.  She states she has not seen any study questionnaire notices to do on the Patient Cloud but she will check again prior to visit. Informed patient she should be receiving a notice to complete questionnaire before Monday's visit. She verbalized understanding. Thanked patient for her time and look forward to seeing her on Monday.  Foye Spurling, BSN, RN Clinical Research Nurse 05/22/2021 1:17 PM

## 2021-05-23 NOTE — Progress Notes (Addendum)
Radiation Oncology         [phone] ________________________________  Name: Christy Waller MRN: [id number]  Date: 05/25/2021  DOB: 11-11-1970  Follow-Up Visit Note   NRG-GY020 Protocol  CC: Wardell Honour, MD  College, [facility]    ICD-10-CM   1. 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         On review of systems, she reports feeling feeling well except for issues with heartburn burping and indigestion.  She has tried Tums recently without relief.  She denies any shortness of breath or pain within the chest area to suggest cardiac etiology.  I recommended she try over-the-counter Prilosec or Nexium to see if this will help her with her issues.  She will consult her primary care physician if no better.  Patient denies any abdominal bloating.  She denies any pelvic pain rectal bleeding hematuria or vaginal bleeding.  Patient is using her vaginal dilator as recommended times a week.  She is sexually active with intercourse and denies any pain with intercourse at this time or postcoital bleeding.  She occasionally will have some low back pain after working long hours.                Allergies:  is allergic to lisinopril.  Meds: Current Outpatient Medications  Medication Sig Dispense Refill   amLODipine (NORVASC) 10 MG tablet Take 1 tablet (10 mg total) by mouth daily. 90 tablet 1   calcium carbonate (TUMS - DOSED IN MG ELEMENTAL CALCIUM) 500 MG chewable tablet Chew 1 tablet by mouth daily.     fluticasone (FLONASE) 50 MCG/ACT nasal spray Place 1  spray into both nostrils daily as needed for allergies or rhinitis.     Multiple Vitamin (MULTIVITAMIN) tablet Take 1 tablet by mouth daily.     VITAMIN D PO Take 5,000 Units by mouth daily.     No current facility-administered medications for this encounter.    Physical Findings: The patient is in no acute distress. Patient is alert and oriented.  height is 5\' 2"  (1.575 m) and weight is 152 lb 9.6 oz (69.2 kg). Her temperature is 97.7 F (36.5 C). Her blood pressure is 147/92 (abnormal) and her pulse is 63. Her respiration is 20 and oxygen saturation is 100%. .  Lungs are clear to auscultation bilaterally. Heart has regular rate and rhythm. No palpable cervical, supraclavicular, or axillary adenopathy. Abdomen soft, non-tender, normal bowel sounds.  On pelvic examination the external genitalia were unremarkable. A speculum exam was performed. There are no mucosal lesions noted in the vaginal vault. On bimanual and rectovaginal examination there were no pelvic masses appreciated.  Vaginal cuff intact.  Rectal sphincter tone normal.  ECOG=0   Lab Findings: Lab Results  Component Value Date   WBC 6.4 06/10/2020   HGB 12.7 06/10/2020   HCT 37.8 06/10/2020   MCV 84.4 06/10/2020   PLT 232 06/10/2020    Radiographic Findings: No results found.  Impression:  Stage IB (pT1b, pN0) grade 2 endometrioid carcinoma with extensive  LVSI   No evidence of recurrence on clinical exam today.  She does not experience any toxicities from her vaginal brachytherapy she will proceed with CT scans in November.  Plan: Routine follow-up in 3 months.   23 minutes of total time was spent for this patient encounter, including preparation, face-to-face counseling with the patient and coordination of care, physical exam, and documentation of the encounter. ____________________________________  Blair Promise, PhD, MD   This document serves as a record of services personally performed by Gery Pray, MD.  It was created on his behalf by Roney Mans, a trained medical scribe. The creation of this record is based on the scribe's personal observations and the provider's statements to them. This document has been checked and approved by the attending provider.

## 2021-05-25 ENCOUNTER — Other Ambulatory Visit: Payer: Self-pay

## 2021-05-25 ENCOUNTER — Inpatient Hospital Stay: Payer: 59 | Attending: Radiation Oncology

## 2021-05-25 ENCOUNTER — Ambulatory Visit
Admission: RE | Admit: 2021-05-25 | Discharge: 2021-05-25 | Disposition: A | Discharge status: Home / Self Care | Payer: 59 | Source: Ambulatory Visit | Attending: Radiation Oncology | Admitting: Radiation Oncology

## 2021-05-25 ENCOUNTER — Encounter: Payer: Self-pay | Admitting: Radiation Oncology

## 2021-05-25 ENCOUNTER — Encounter: Payer: Self-pay | Admitting: *Deleted

## 2021-05-25 DIAGNOSIS — K30 Functional dyspepsia: Secondary | ICD-10-CM | POA: Insufficient documentation

## 2021-05-25 DIAGNOSIS — M545 Low back pain, unspecified: Secondary | ICD-10-CM | POA: Insufficient documentation

## 2021-05-25 DIAGNOSIS — C541 Malignant neoplasm of endometrium: Secondary | ICD-10-CM

## 2021-05-25 DIAGNOSIS — Z006 Encounter for examination for normal comparison and control in clinical research program: Secondary | ICD-10-CM | POA: Insufficient documentation

## 2021-05-25 DIAGNOSIS — Z79899 Other long term (current) drug therapy: Secondary | ICD-10-CM | POA: Insufficient documentation

## 2021-05-25 DIAGNOSIS — Z9221 Personal history of antineoplastic chemotherapy: Secondary | ICD-10-CM | POA: Insufficient documentation

## 2021-05-25 DIAGNOSIS — Z923 Personal history of irradiation: Secondary | ICD-10-CM | POA: Insufficient documentation

## 2021-05-25 DIAGNOSIS — Z8542 Personal history of malignant neoplasm of other parts of uterus: Secondary | ICD-10-CM | POA: Insufficient documentation

## 2021-05-25 DIAGNOSIS — R12 Heartburn: Secondary | ICD-10-CM | POA: Diagnosis not present

## 2021-05-25 LAB — BUN & CREATININE (CHCC)
BUN: 14 mg/dL (ref 6–20)
Creatinine: 0.87 mg/dL (ref 0.44–1.00)
GFR, Estimated: >60 mL/min (ref >60)

## 2021-05-25 NOTE — Progress Notes (Signed)
Christy Waller is here today for follow up post radiation to the pelvic.  They completed their radiation on: 07/07/20   Does the patient complain of any of the following:  Pain: Patient reports having some chest discomfort, stating that it feel like heartburn. Patient states she took Tums, which was not effective.  Abdominal bloating: no Diarrhea/Constipation: no Nausea/Vomiting: no Vaginal Discharge: no Blood in Urine or Stool: no Urinary Issues (dysuria/incomplete emptying/ incontinence/ increased frequency/urgency): no Does patient report using vaginal dilator 2-3 times a week and/or sexually active 2-3 weeks: Patient reports using vaginal dilator as instructed.  Post radiation skin changes: no   Additional comments if applicable:   Vitals:   05/25/21 1010  BP: (!) 147/92  Pulse: 63  Resp: 20  Temp: 97.7 F (36.5 C)  SpO2: 100%  Weight: 152 lb 9.6 oz (69.2 kg)  Height: 5\' 2"  (1.575 m)

## 2021-05-25 NOTE — Research (Signed)
NRG-GY020 Arm 1, Week 49 Visit Patient comes into clinic today by herself for 49 week visit on study.   PROs Patient reported she did not receive any notifications to complete study questionnaires for this visit. Patient had to reset her password during the visit but was able to log into the system. She did not have any questionnaires available to complete at this time They may not be due until 52 weeks. Con Meds Reviewed medication list with patient. She denies any new medications since last visit other than taking Tums PRN for Heartburn. Medication list updated. VS Collected prior to MD visit. See VS flowsheet.  H&P, PS and Toxicity assessments Completed by Dr. Sondra Come today. See provider's note.  Adverse Events Heartburn- Patient reports heartburn symptoms today which started about one month ago and have become increasingly more frequent and severe. She tried TUMs without any relief yesterday and today. Dr.Kinard states heartburn is unrelated to her radiation treatments.   Adverse Events 03/05/2021-05/25/2021  Adverse Event Grade (CTCAE v.5.0) Start Date End Date Radiation Related  Comments  Heartburn 2 04/25/21 Ongoing Unrelated TUMS   Plan;  This research nurse will clarify with study when ePROs are due and notify patient.  CT scan is due 06/22/21 (+/- 7 days). Patient requests it to be scheduled for 06/16/21. Lab for BUN/Creat completed today for the CT. 2 bottles of oral contrast with written instructions given to patient today for the CT scan.  It is unclear with this research nurse when next study visit is due. Protocol is not specific and this research nurse has emailed the Environmental consultant for clarification on timing and windows for assessments due in the follow up period on study. Patient understands it will continue every 3 months for the next 2 years but nurse is waiting for guidance on the exact dates those assessments are required. Research nurse will call patient with schedule as soon as  follow up is clarified.  Thanked patient for her participation in this clinical trial. Encouraged patient to contact research nurse if any questions or concerns in the meantime. Patient verbalized understanding.  Foye Spurling, BSN, RN Clinical Research Nurse 05/25/2021 2:51 PM

## 2021-06-10 ENCOUNTER — Telehealth: Payer: Self-pay | Admitting: *Deleted

## 2021-06-10 NOTE — Telephone Encounter (Signed)
[place] GY020 Study:  Called patient to ask if she received any notification from [employer] to complete her study questionnaires yet. According to the study, they are due at 52 weeks but should be available starting yesterday and should be completed prior to the CT scan next week. She says she has not received any notifications but she will check today. Asked her to check to see if they are available yet and to please complete prior to having her CT scan done next week. Reminded her about the CT scheduled for next week on Tuesday at 9 am and to arrive at least 30 minutes early to check in at front desk of [facility]. Asked patient to return call to research nurse if any questions or problems. She verbalized understanding.  Informed patient that we are planning to get her scheduled with Dr. Berline Lopes for next visit in 3 months around 08/24/21 but Dr. Charisse March January schedule is not available yet. We will schedule as soon as possible and let her know when it is scheduled. She verbalized understanding.  Foye Spurling, BSN, RN, [employer] Clinical Research Nurse 06/10/2021 10:14 AM

## 2021-06-16 ENCOUNTER — Ambulatory Visit (HOSPITAL_COMMUNITY)
Admission: RE | Admit: 2021-06-16 | Discharge: 2021-06-16 | Disposition: A | Discharge status: Home / Self Care | Payer: 59 | Source: Ambulatory Visit | Attending: Radiation Oncology | Admitting: Radiation Oncology

## 2021-06-16 ENCOUNTER — Encounter (HOSPITAL_COMMUNITY): Payer: Self-pay

## 2021-06-16 DIAGNOSIS — Z006 Encounter for examination for normal comparison and control in clinical research program: Secondary | ICD-10-CM | POA: Diagnosis present

## 2021-06-16 DIAGNOSIS — C541 Malignant neoplasm of endometrium: Secondary | ICD-10-CM | POA: Insufficient documentation

## 2021-06-16 MED ORDER — IOHEXOL 350 MG/ML SOLN
80.0000 mL | Freq: Once | INTRAVENOUS | Status: AC | PRN
  Administered 2021-06-16: 80 mL via INTRAVENOUS

## 2021-07-14 ENCOUNTER — Telehealth: Payer: Self-pay | Admitting: Oncology

## 2021-07-14 NOTE — Telephone Encounter (Signed)
Christy Waller with follow up appointment with Dr. Berline Lopes on 08/24/21 at 2:15.

## 2021-07-15 ENCOUNTER — Other Ambulatory Visit: Payer: Self-pay | Admitting: Family Medicine

## 2021-07-15 DIAGNOSIS — I10 Essential (primary) hypertension: Secondary | ICD-10-CM

## 2021-08-03 ENCOUNTER — Emergency Department (HOSPITAL_COMMUNITY): Payer: 59

## 2021-08-03 ENCOUNTER — Encounter (HOSPITAL_COMMUNITY): Payer: Self-pay | Admitting: Emergency Medicine

## 2021-08-03 ENCOUNTER — Emergency Department (HOSPITAL_COMMUNITY)
Admission: EM | Admit: 2021-08-03 | Discharge: 2021-08-03 | Disposition: A | Discharge status: Home / Self Care | Payer: 59 | Attending: Emergency Medicine | Admitting: Emergency Medicine

## 2021-08-03 ENCOUNTER — Other Ambulatory Visit: Payer: Self-pay

## 2021-08-03 DIAGNOSIS — R55 Syncope and collapse: Secondary | ICD-10-CM | POA: Insufficient documentation

## 2021-08-03 DIAGNOSIS — R0789 Other chest pain: Secondary | ICD-10-CM

## 2021-08-03 DIAGNOSIS — Z79899 Other long term (current) drug therapy: Secondary | ICD-10-CM | POA: Insufficient documentation

## 2021-08-03 DIAGNOSIS — R42 Dizziness and giddiness: Secondary | ICD-10-CM | POA: Insufficient documentation

## 2021-08-03 DIAGNOSIS — Z87891 Personal history of nicotine dependence: Secondary | ICD-10-CM | POA: Diagnosis not present

## 2021-08-03 LAB — CBC WITH DIFFERENTIAL/PLATELET
Abs Immature Granulocytes: 0.06 10*3/uL (ref 0.00–0.07)
Basophils Absolute: 0.1 10*3/uL (ref 0.0–0.1)
Basophils Relative: 1 %
Eosinophils Absolute: 0.1 10*3/uL (ref 0.0–0.5)
Eosinophils Relative: 1 %
HCT: 39.6 % (ref 36.0–46.0)
Hemoglobin: 13.2 g/dL (ref 12.0–15.0)
Immature Granulocytes: 1 %
Lymphocytes Relative: 17 %
Lymphs Abs: 1.6 10*3/uL (ref 0.7–4.0)
MCH: 28.4 pg (ref 26.0–34.0)
MCHC: 33.3 g/dL (ref 30.0–36.0)
MCV: 85.3 fL (ref 80.0–100.0)
Monocytes Absolute: 0.6 10*3/uL (ref 0.1–1.0)
Monocytes Relative: 7 %
Neutro Abs: 7 10*3/uL (ref 1.7–7.7)
Neutrophils Relative %: 73 %
Platelets: 252 10*3/uL (ref 150–400)
RBC: 4.64 MIL/uL (ref 3.87–5.11)
RDW: 14.2 % (ref 11.5–15.5)
WBC: 9.4 10*3/uL (ref 4.0–10.5)
nRBC: 0 % (ref 0.0–0.2)

## 2021-08-03 LAB — BASIC METABOLIC PANEL
Anion gap: 7 (ref 5–15)
BUN: 12 mg/dL (ref 6–20)
CO2: 26 mmol/L (ref 22–32)
Calcium: 9.7 mg/dL (ref 8.9–10.3)
Chloride: 105 mmol/L (ref 98–111)
Creatinine, Ser: 0.7 mg/dL (ref 0.44–1.00)
GFR, Estimated: >60 mL/min (ref >60)
Glucose, Bld: 105 mg/dL — ABNORMAL HIGH (ref 70–99)
Potassium: 3.6 mmol/L (ref 3.5–5.1)
Sodium: 138 mmol/L (ref 135–145)

## 2021-08-03 LAB — TROPONIN I (HIGH SENSITIVITY)
Troponin I (High Sensitivity): 6 ng/L (ref <18)
Troponin I (High Sensitivity): 9 ng/L (ref <18)

## 2021-08-03 NOTE — ED Notes (Signed)
X-ray at bedside

## 2021-08-03 NOTE — ED Triage Notes (Signed)
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 Pt sat down w/ relief, reported some shob, no cp. Pt was up walking and had sudden onset dizziness again. Otw to hospital pt developed centralized chest tightness. Pt was hypertensive 180/110.   20g LAC 324mg  ASA 1 SL nitro

## 2021-08-03 NOTE — ED Provider Notes (Addendum)
Emergency Medicine Provider Triage Evaluation Note  Christy Waller , a 50 y.o. female  was evaluated in triage.  Pt complains of lightheadedness x2, feeling of near syncope, chest pressure.  No chest pain.  She did have feelings of racing heart.  Review of Systems  Positive: Near syncope Negative: Fever  Physical Exam  There were no vitals taken for this visit. Gen:   Awake, no distress   Resp:  Normal effort MSK:   Moves extremities without difficulty  Other:  Regular in rhythm  Medical Decision Making  Medically screening exam initiated at 3:21 PM.  Appropriate orders placed.  Christy Waller was informed that the remainder of the evaluation will be completed by another provider, this initial triage assessment does not replace that evaluation, and the importance of remaining in the ED until their evaluation is complete.  Work-up initiated   Margarita Mail, PA-C 08/03/21 1523    Margarita Mail, PA-C 08/03/21 1524    Regan Lemming, MD 08/03/21 1539

## 2021-08-03 NOTE — ED Provider Notes (Signed)
[facility] EMERGENCY DEPARTMENT Provider Note  CSN: [id number] Arrival date & time: 08/03/21 1517    History Chief Complaint  Patient presents with   Dizziness   Chest Pain    Christy Waller is a 50 y.o. 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 She began feeling lightheaded, dizzy with tunnel vision and felt like she might pass out. She went to sit down and began to feel better. EMS was called out to the house and checked her vitals (she was hypertensive) and an EKG. She was able to stand with minimal symptoms but when she walked he began to feel worse and decided to come for evaluation. While enroute she had some chest discomfort, like she needed to belch and was given one NTG. She has had minimal symptoms while waiting for an ED room. No further chest pains. No headache, N/V/D, dysuria, fever. No arm or leg weakness, slurred speech or facial droop.    Past Medical History:  Diagnosis Date   Anemia 2013   Iron Deficiency Anemia   Bradycardia 03/2012   Event monitor 8/13: NSR, sinus bradycardia, sinus tachycardia, no significant bradycardia arrhythmias   Chest pain 03/22/2012   Normal Myoview, normal echo, chest CT negative for pulmonary embolism   endometrial ca 04/2020   Family history of breast cancer 06/17/2020   History of colonoscopy    History of CT scan 2021   Blair Promise, MD   History of nuclear stress test 03/2012   Myoview 03/23/12: EF 50%, no ischemia or scar.   History of radiation therapy 06/23/20-07/07/20   vaginal brachytherapy - HDR- Endometrium, Dr. Sondra Come   HTN (hypertension) 03/2012   2-D echocardiogram 03/22/12: EF 50%, mild LAE, no wall motion abnormalities.   Tubal pregnancy aprox 1998   In her late 4s   Vitamin D deficiency 2013    Past Surgical History:  Procedure Laterality Date   ABDOMINAL HYSTERECTOMY  2021   Blair Promise, MD, PhD   CESAREAN SECTION     x2   ECTOPIC PREGNANCY SURGERY     ROBOTIC ASSISTED  TOTAL HYSTERECTOMY WITH BILATERAL SALPINGO OOPHERECTOMY N/A 05/01/2020   Procedure: XI ROBOTIC ASSISTED TOTAL HYSTERECTOMY WITH BILATERAL SALPINGO OOPHORECTOMY AND MINI LAPAROTOMY;  Surgeon: [name], MD;  Location: [facility];  Service: Gynecology;  Laterality: N/A;   SENTINEL NODE BIOPSY N/A 05/01/2020   Procedure: SENTINEL NODE BIOPSY;  Surgeon: [name], MD;  Location: [facility];  Service: Gynecology;  Laterality: N/A;   TUBAL LIGATION      Family History  Problem Relation Age of Onset   Diabetes type II Sister    Hypertension Mother    Hypertension Father    Hypertension Sister    Breast cancer Maternal Aunt        dx 31s   Breast cancer Cousin        maternal cousin; dx 93s   Breast cancer Cousin        dx early 68s   Hypertension Daughter     Social History   Tobacco Use   Smoking status: Former    Packs/day: 0.20    Years: 25.00    Pack years: 5.00    Types: Cigarettes    Quit date: 09/29/2016    Years since quitting: 4.8   Smokeless tobacco: Never  Vaping Use   Vaping Use: Never used  Substance Use Topics   Alcohol use: Yes  Comment: occasional   Drug use: Yes    Types: Marijuana    Comment: occ      Home Medications Prior to Admission medications   Medication Sig Start Date End Date Taking? Authorizing Provider  amLODipine (NORVASC) 10 MG tablet TAKE ONE TABLET BY MOUTH DAILY Patient taking differently: Take 10 mg by mouth daily. 07/15/21  Yes Wardell Honour, MD  calcium carbonate (TUMS - DOSED IN MG ELEMENTAL CALCIUM) 500 MG chewable tablet Chew 1 tablet by mouth as needed for indigestion. 05/24/21  Yes [provider]  fluticasone (FLONASE) 50 MCG/ACT nasal spray Place 1 spray into both nostrils daily as needed for allergies or rhinitis. 04/16/20  Yes [provider]  Multiple Vitamin (MULTIVITAMIN) tablet Take 1 tablet by mouth daily.   Yes [provider]  VITAMIN D PO Take 5,000 Units by mouth daily.   Yes  [provider]     Allergies    Lisinopril   Review of Systems   Review of Systems A comprehensive review of systems was completed and negative except as noted in HPI.    Physical Exam BP (!) 146/85 (BP Location: Right Arm)    Pulse (!) 53    Temp 97.6 F (36.4 C) (Oral)    Resp 16    SpO2 100%   Physical Exam Vitals and nursing note reviewed.  Constitutional:      Appearance: Normal appearance.  HENT:     Head: Normocephalic and atraumatic.     Nose: Nose normal.     Mouth/Throat:     Mouth: Mucous membranes are moist.  Eyes:     Extraocular Movements: Extraocular movements intact.     Conjunctiva/sclera: Conjunctivae normal.  Cardiovascular:     Rate and Rhythm: Normal rate.  Pulmonary:     Effort: Pulmonary effort is normal.     Breath sounds: Normal breath sounds.  Abdominal:     General: Abdomen is flat.     Palpations: Abdomen is soft.     Tenderness: There is no abdominal tenderness.  Musculoskeletal:        General: No swelling. Normal range of motion.     Cervical back: Neck supple.  Skin:    General: Skin is warm and dry.  Neurological:     General: No focal deficit present.     Mental Status: She is alert.  Psychiatric:        Mood and Affect: Mood normal.     ED Results / Procedures / Treatments   Labs (all labs ordered are listed, but only abnormal results are displayed) Labs Reviewed  BASIC METABOLIC PANEL - Abnormal; Notable for the following components:      Result Value   Glucose, Bld 105 (*)    All other components within normal limits  CBC WITH DIFFERENTIAL/PLATELET  CBG MONITORING, ED  TROPONIN I (HIGH SENSITIVITY)  TROPONIN I (HIGH SENSITIVITY)    EKG EKG Interpretation  Date/Time:  Monday August 03 2021 15:21:23 EST Ventricular Rate:  64 PR Interval:  150 QRS Duration: 74 QT Interval:  390 QTC Calculation: 402 R Axis:   15 Text Interpretation: Normal sinus rhythm with sinus arrhythmia Septal infarct , age  undetermined Abnormal ECG No significant change since last tracing Confirmed by Calvert Cantor [phone]) on 08/03/2021 9:13:15 PM  Radiology DG Chest Port 1 View  Result Date: 08/03/2021 CLINICAL DATA:  Chest pain EXAM: PORTABLE CHEST 1 VIEW COMPARISON:  Chest CT 06/16/2021. FINDINGS: Cardiomediastinal silhouette is within normal  limits for lordotic positioning. The lungs are clear. There is no pleural effusion or pneumothorax. No acute fractures are seen. IMPRESSION: No active disease. Electronically Signed   By: Ronney Asters M.D.   On: 08/03/2021 21:38    Procedures Procedures  Medications Ordered in the ED Medications - No data to display   MDM Rules/Calculators/A&P MDM   ED Course  I have reviewed the triage vital signs and the nursing notes.  Pertinent labs & imaging results that were available during my care of the patient were reviewed by me and considered in my medical decision making (see chart for details).  Clinical Course as of 08/03/21 2220  Mon Aug 03, 2021  2218 Patient is not orthostatic, in fact her BP went up with standing. She still feels well enough to go home. Recommend rest, hydration, PCP follow up for BP recheck and RTED for any other concerns.  [CS]    Clinical Course User Index [CS] Truddie Hidden, MD    Final Clinical Impression(s) / ED Diagnoses Final diagnoses:  Near syncope  Atypical chest pain    Rx / DC Orders ED Discharge Orders     None        Truddie Hidden, MD 08/03/21 2220

## 2021-08-04 ENCOUNTER — Encounter: Payer: Self-pay | Admitting: Family Medicine

## 2021-08-04 ENCOUNTER — Ambulatory Visit (INDEPENDENT_AMBULATORY_CARE_PROVIDER_SITE_OTHER): Payer: 59 | Admitting: Family Medicine

## 2021-08-04 VITALS — BP 132/86 | Temp 97.8°F | Ht 62.0 in | Wt 154.0 lb

## 2021-08-04 DIAGNOSIS — I10 Essential (primary) hypertension: Secondary | ICD-10-CM

## 2021-08-04 DIAGNOSIS — R42 Dizziness and giddiness: Secondary | ICD-10-CM

## 2021-08-04 MED ORDER — MECLIZINE HCL 25 MG PO CHEW: 25.0000 mg | CHEWABLE_TABLET | Freq: Three times a day (TID) | ORAL | 0 refills | Status: DC | PRN

## 2021-08-04 NOTE — Patient Instructions (Signed)
Take Antivert and do exercises as per hand-out

## 2021-08-04 NOTE — Progress Notes (Signed)
Provider:  Alain Honey, MD  Careteam: Patient Care Team: Wardell Honour, MD as PCP - General (Family Medicine)  PLACE OF SERVICE:  [place] Directive information    Allergies  Allergen Reactions   Lisinopril Swelling    Angioedema    Chief Complaint  Patient presents with   Acute Visit    Patient presents today for dizziness. She reports going to the emergency department on yesterday, 08/03/21. She reports when standing mainly.     HPI: Patient is a 50 y.o. female patient presents with chief complaint of dizziness.  Yesterday she went to the emergency room by ambulance because she felt like she was about to pass out and dizzy.  In the emergency room she had an EKG cardiac enzymes metabolic panel all of which were normal and she was told to follow-up with her primary care physician. Today she still has some dizziness.  It seems to be worse with movements and as long as she is still symptoms are improved.  She denies any ear symptoms.  Review of Systems:  Review of Systems  Constitutional: Negative.   Respiratory: Negative.    Cardiovascular: Negative.   Neurological:  Positive for dizziness.  Psychiatric/Behavioral: Negative.    All other systems reviewed and are negative.  Past Medical History:  Diagnosis Date   Anemia 2013   Iron Deficiency Anemia   Bradycardia 03/2012   Event monitor 8/13: NSR, sinus bradycardia, sinus tachycardia, no significant bradycardia arrhythmias   Chest pain 03/22/2012   Normal Myoview, normal echo, chest CT negative for pulmonary embolism   endometrial ca 04/2020   Family history of breast cancer 06/17/2020   History of colonoscopy    History of CT scan 2021   Blair Promise, MD   History of nuclear stress test 03/2012   Myoview 03/23/12: EF 50%, no ischemia or scar.   History of radiation therapy 06/23/20-07/07/20   vaginal brachytherapy - HDR- Endometrium, Dr. Sondra Come   HTN (hypertension) 03/2012   2-D  echocardiogram 03/22/12: EF 50%, mild LAE, no wall motion abnormalities.   Tubal pregnancy aprox 1998   In her late 71s   Vitamin D deficiency 2013   Past Surgical History:  Procedure Laterality Date   ABDOMINAL HYSTERECTOMY  2021   Blair Promise, MD, PhD   CESAREAN SECTION     x2   ECTOPIC PREGNANCY SURGERY     ROBOTIC ASSISTED TOTAL HYSTERECTOMY WITH BILATERAL SALPINGO OOPHERECTOMY N/A 05/01/2020   Procedure: XI ROBOTIC ASSISTED TOTAL HYSTERECTOMY WITH BILATERAL SALPINGO OOPHORECTOMY AND MINI LAPAROTOMY;  Surgeon: [name], MD;  Location: [facility];  Service: Gynecology;  Laterality: N/A;   SENTINEL NODE BIOPSY N/A 05/01/2020   Procedure: SENTINEL NODE BIOPSY;  Surgeon: [name], MD;  Location: [facility];  Service: Gynecology;  Laterality: N/A;   TUBAL LIGATION     Social History:   reports that she quit smoking about 4 years ago. Her smoking use included cigarettes. She has a 5.00 pack-year smoking history. She has never used smokeless tobacco. She reports current alcohol use. She reports current drug use. Drug: Marijuana.  Family History  Problem Relation Age of Onset   Diabetes type II Sister    Hypertension Mother    Hypertension Father    Hypertension Sister    Breast cancer Maternal Aunt        dx 42s   Breast cancer Cousin        maternal cousin; dx 41s  Breast cancer Cousin        dx early 62s   Hypertension Daughter     Medications: Patient's Medications  New Prescriptions   MECLIZINE HCL (ANTIVERT) 25 MG CHEW    Chew 1 tablet (25 mg total) by mouth 3 (three) times daily as needed.  Previous Medications   AMLODIPINE (NORVASC) 10 MG TABLET    TAKE ONE TABLET BY MOUTH DAILY   CALCIUM CARBONATE (TUMS - DOSED IN MG ELEMENTAL CALCIUM) 500 MG CHEWABLE TABLET    Chew 1 tablet by mouth as needed for indigestion.   FLUTICASONE (FLONASE) 50 MCG/ACT NASAL SPRAY    Place 1 spray into both nostrils daily as needed for allergies or rhinitis.   MULTIPLE VITAMIN  (MULTIVITAMIN) TABLET    Take 1 tablet by mouth daily.   VITAMIN D PO    Take 5,000 Units by mouth daily.  Modified Medications   No medications on file  Discontinued Medications   No medications on file    Physical Exam:  Vitals:   08/04/21 1055  BP: 132/86  Temp: 97.8 F (36.6 C)  Weight: 154 lb (69.9 kg)  Height: 5\' 2"  (1.575 m)   Body mass index is 28.17 kg/m. Wt Readings from Last 3 Encounters:  08/04/21 154 lb (69.9 kg)  05/25/21 152 lb 9.6 oz (69.2 kg)  03/05/21 155 lb 6 oz (70.5 kg)    Physical Exam Vitals and nursing note reviewed.  Constitutional:      Appearance: Normal appearance.  HENT:     Ears:     Comments: Right TM is dull Left EAC full of cerumen Cardiovascular:     Rate and Rhythm: Normal rate and regular rhythm.  Pulmonary:     Effort: Pulmonary effort is normal.     Breath sounds: Normal breath sounds.  Neurological:     General: No focal deficit present.     Mental Status: She is alert and oriented to person, place, and time.     Cranial Nerves: No cranial nerve deficit.     Gait: Gait normal.  Psychiatric:        Mood and Affect: Mood normal.        Behavior: Behavior normal.    Labs reviewed: Basic Metabolic Panel: Recent Labs    12/05/20 1430 05/25/21 1044 08/03/21 1531  NA  --   --  138  K  --   --  3.6  CL  --   --  105  CO2  --   --  26  GLUCOSE  --   --  105*  BUN 14 14 12   CREATININE 0.77 0.87 0.70  CALCIUM  --   --  9.7   Liver Function Tests: No results for input(s): AST, ALT, ALKPHOS, BILITOT, PROT, ALBUMIN in the last 8760 hours. No results for input(s): LIPASE, AMYLASE in the last 8760 hours. No results for input(s): AMMONIA in the last 8760 hours. CBC: Recent Labs    08/03/21 1531  WBC 9.4  NEUTROABS 7.0  HGB 13.2  HCT 39.6  MCV 85.3  PLT 252   Lipid Panel: No results for input(s): CHOL, HDL, LDLCALC, TRIG, CHOLHDL, LDLDIRECT in the last 8760 hours. TSH: No results for input(s): TSH in the last 8760  hours. A1C: Lab Results  Component Value Date   HGBA1C 5.1 04/28/2020     Assessment/Plan  1. Dizziness and giddiness Likely related to inner ear problem,   There is some slight nystagmus.  We reviewed Epley  maneuver. Definite positional component. Will also try Antivert 2. Primary hypertension BP okay; no change      Alain Honey, MD [place] [phone]

## 2021-08-22 NOTE — Progress Notes (Deleted)
Cardiology Office Note   Date:  08/22/2021   ID:  Matricia, Begnaud Oct 02, 1970, MRN [id number]  PCP:  Wardell Honour, MD  Cardiologist:   Jarold Macomber Martinique, MD   No chief complaint on file.     History of Present Illness: Christy Waller is a 51 y.o. female who is seen at the request of Dr Sabra Heck for evaluation of dizziness, near syncope and atypical chest pain. Last seen by me in April 2018. She has a history of HTN and DM. She was evaluated in 2013 when she presented with chest pain. Cardiac markers were negative. 2-D echocardiogram 03/22/12: EF 50%, mild LAE, no wall motion abnormalities. Myoview 03/23/12: EF 50%, no ischemia or scar. Chest CT angiogram 03/22/12: Negative for pulmonary embolism. Patient was noted to have sinus bradycardia with heart rates down into the 40s. A 21 day event monitor demonstrated sinus rhythm, sinus bradycardia, sinus tachycardia and occasional PVCs. No significant bradyarrhythmias noted. Of note, the patient was able to get her heart rate up to 152 on the treadmill for her Myoview ruling out chronotropic incompetence. When seen in 2018 she had symptoms of dizziness associated with marked elevation of BP. This all improved with addition of amlodipine.   She was seen in the ED in December with dizziness. Labs and Ecg were OK. Only mildly elevated BP.     Past Medical History:  Diagnosis Date   Anemia 2013   Iron Deficiency Anemia   Bradycardia 03/2012   Event monitor 8/13: NSR, sinus bradycardia, sinus tachycardia, no significant bradycardia arrhythmias   Chest pain 03/22/2012   Normal Myoview, normal echo, chest CT negative for pulmonary embolism   endometrial ca 04/2020   Family history of breast cancer 06/17/2020   History of colonoscopy    History of CT scan 2021   Blair Promise, MD   History of nuclear stress test 03/2012   Myoview 03/23/12: EF 50%, no ischemia or scar.   History of radiation therapy 06/23/20-07/07/20   vaginal brachytherapy - HDR-  Endometrium, Dr. Sondra Come   HTN (hypertension) 03/2012   2-D echocardiogram 03/22/12: EF 50%, mild LAE, no wall motion abnormalities.   Tubal pregnancy aprox 1998   In her late 63s   Vitamin D deficiency 2013    Past Surgical History:  Procedure Laterality Date   ABDOMINAL HYSTERECTOMY  2021   Blair Promise, MD, PhD   CESAREAN SECTION     x2   ECTOPIC PREGNANCY SURGERY     ROBOTIC ASSISTED TOTAL HYSTERECTOMY WITH BILATERAL SALPINGO OOPHERECTOMY N/A 05/01/2020   Procedure: XI ROBOTIC ASSISTED TOTAL HYSTERECTOMY WITH BILATERAL SALPINGO OOPHORECTOMY AND MINI LAPAROTOMY;  Surgeon: [name], MD;  Location: [facility];  Service: Gynecology;  Laterality: N/A;   SENTINEL NODE BIOPSY N/A 05/01/2020   Procedure: SENTINEL NODE BIOPSY;  Surgeon: [name], MD;  Location: [facility];  Service: Gynecology;  Laterality: N/A;   TUBAL LIGATION       Current Outpatient Medications  Medication Sig Dispense Refill   amLODipine (NORVASC) 10 MG tablet TAKE ONE TABLET BY MOUTH DAILY (Patient taking differently: Take 10 mg by mouth daily.) 90 tablet 1   calcium carbonate (TUMS - DOSED IN MG ELEMENTAL CALCIUM) 500 MG chewable tablet Chew 1 tablet by mouth as needed for indigestion.     fluticasone (FLONASE) 50 MCG/ACT nasal spray Place 1 spray into both nostrils daily as needed for allergies or rhinitis.     Meclizine HCl (ANTIVERT) 25  MG CHEW Chew 1 tablet (25 mg total) by mouth 3 (three) times daily as needed. 20 tablet 0   Multiple Vitamin (MULTIVITAMIN) tablet Take 1 tablet by mouth daily.     VITAMIN D PO Take 5,000 Units by mouth daily.     No current facility-administered medications for this visit.    Allergies:   Lisinopril    Social History:  The patient  reports that she quit smoking about 4 years ago. Her smoking use included cigarettes. She has a 5.00 pack-year smoking history. She has never used smokeless tobacco. She reports current alcohol use. She reports current drug use. Drug:  Marijuana.   Family History:  The patient's ***family history includes Breast cancer in her cousin, cousin, and maternal aunt; Diabetes type II in her sister; Hypertension in her daughter, father, mother, and sister.    ROS:  Please see the history of present illness.   Otherwise, review of systems are positive for {NONE DEFAULTED:18576}.   All other systems are reviewed and negative.    PHYSICAL EXAM: VS:  LMP 10/27/2016  , BMI There is no height or weight on file to calculate BMI. GEN: Well nourished, well developed, in no acute distress HEENT: normal Neck: no JVD, carotid bruits, or masses Cardiac: ***RRR; no murmurs, rubs, or gallops,no edema  Respiratory:  clear to auscultation bilaterally, normal work of breathing GI: soft, nontender, nondistended, + BS MS: no deformity or atrophy Skin: warm and dry, no rash Neuro:  Strength and sensation are intact Psych: euthymic mood, full affect   EKG:  EKG {ACTION; IS/IS ZOX:09604540} ordered today. The ekg ordered today demonstrates ***   Recent Labs: 08/03/2021: BUN 12; Creatinine, Ser 0.70; Hemoglobin 13.2; Platelets 252; Potassium 3.6; Sodium 138    Lipid Panel    Component Value Date/Time   CHOL 148 10/06/2016 1622   TRIG 83 10/06/2016 1622   HDL 67 10/06/2016 1622   CHOLHDL 2.2 10/06/2016 1622   VLDL 17 10/06/2016 1622   LDLCALC 64 10/06/2016 1622      Wt Readings from Last 3 Encounters:  08/04/21 154 lb (69.9 kg)  05/25/21 152 lb 9.6 oz (69.2 kg)  03/05/21 155 lb 6 oz (70.5 kg)      Other studies Reviewed: Additional studies/ records that were reviewed today include: ***. Review of the above records demonstrates: ***   ASSESSMENT AND PLAN:  1.  ***   Current medicines are reviewed at length with the patient today.  The patient {ACTIONS; HAS/DOES NOT HAVE:19233} concerns regarding medicines.  The following changes have been made:  {PLAN; NO CHANGE:13088:s}  Labs/ tests ordered today include: *** No  orders of the defined types were placed in this encounter.        Disposition:   FU with *** in {gen number [id number]:[id number]} {Days to years:10300}  Signed, [place], MD  08/22/2021 12:56 PM    [facility] [address] [place], [place], [address] Phone [phone], Fax [phone]

## 2021-08-24 ENCOUNTER — Inpatient Hospital Stay: Payer: Self-pay | Attending: Radiation Oncology | Admitting: Gynecologic Oncology

## 2021-08-24 ENCOUNTER — Other Ambulatory Visit: Payer: Self-pay | Admitting: Family Medicine

## 2021-08-24 ENCOUNTER — Other Ambulatory Visit: Payer: Self-pay

## 2021-08-24 ENCOUNTER — Encounter: Payer: Self-pay | Admitting: *Deleted

## 2021-08-24 ENCOUNTER — Encounter: Payer: Self-pay | Admitting: Gynecologic Oncology

## 2021-08-24 VITALS — BP 144/100 | HR 63 | Temp 98.3°F | Resp 18 | Ht 62.0 in | Wt 162.2 lb

## 2021-08-24 DIAGNOSIS — Z90710 Acquired absence of both cervix and uterus: Secondary | ICD-10-CM | POA: Insufficient documentation

## 2021-08-24 DIAGNOSIS — R42 Dizziness and giddiness: Secondary | ICD-10-CM | POA: Insufficient documentation

## 2021-08-24 DIAGNOSIS — I10 Essential (primary) hypertension: Secondary | ICD-10-CM | POA: Insufficient documentation

## 2021-08-24 DIAGNOSIS — Z87891 Personal history of nicotine dependence: Secondary | ICD-10-CM | POA: Insufficient documentation

## 2021-08-24 DIAGNOSIS — Z923 Personal history of irradiation: Secondary | ICD-10-CM | POA: Insufficient documentation

## 2021-08-24 DIAGNOSIS — C541 Malignant neoplasm of endometrium: Secondary | ICD-10-CM

## 2021-08-24 DIAGNOSIS — Z79899 Other long term (current) drug therapy: Secondary | ICD-10-CM | POA: Insufficient documentation

## 2021-08-24 DIAGNOSIS — Z90722 Acquired absence of ovaries, bilateral: Secondary | ICD-10-CM | POA: Insufficient documentation

## 2021-08-24 DIAGNOSIS — Z8542 Personal history of malignant neoplasm of other parts of uterus: Secondary | ICD-10-CM | POA: Insufficient documentation

## 2021-08-24 DIAGNOSIS — R519 Headache, unspecified: Secondary | ICD-10-CM | POA: Insufficient documentation

## 2021-08-24 NOTE — Progress Notes (Signed)
Gynecologic Oncology Return Clinic Visit  08/24/2021  Reason for Visit: Surveillance visit in the setting of high-intermediate risk uterine cancer  Treatment History: Oncology History Overview Note  MMR IHC - loss of MSH2 and 6 expression MSI - High NEGATIVE germline testing   Endometrial cancer [facility])   Initial Diagnosis   Endometrial cancer ([place])   04/17/2020 Initial Biopsy   EMB: Grade 2 endometrioid adenocarcinoma    05/01/2020 Surgery   Robotic-assisted laparoscopic total hysterectomy with bilateral salpingoophorectomy, SLN biopsy, mini-lap for specimen removal  On EUA, 10cm mobile uterus. On intra-abdominal entry, normal upper abdominal survey. Normal small and large bowel. Omentum adherent to the anterior abdominal wall. Sigmoid epiploica adherent to the posterior uterus. Uterus 10-12cm, bulbous fundus. Adnexa adherent to posterior uterus, otherwise normal appearing. Mapping successful to right external and right obturator SLN, left obturator SLN, right presacral. All lymph nodes prominent.  No intra-abdominal or pelvic evidence of disease otherwise.  Some adhesions between the bladder and lower uterine segment/cervix, consistent with patient's prior C-section history.   05/01/2020 Pathology Results   A. UTERUS, CERVIX, BILATERAL TUBES AND OVARIES, RESECTION:   Uterus:  -  Endometrioid carcinoma, FIGO grade 2  -  See oncology table and comment below   Cervix:  -  No carcinoma identified   Bilateral Ovaries:  -  No carcinoma identified   B. LYMPH NODE, SENTINEL, RIGHT OBTURATOR, EXCISION:  -  No carcinoma identified in one lymph node (0/1)  -  See comment   C. LYMPH NODE, SENTINEL, RIGHT EXTERNAL ILIAC, EXCISION:  -  No carcinoma identified in one lymph node (0/1)  -  See comment   D. LYMPH NODE, SENTINEL, RIGHT PRE-SACRAL, EXCISION:  -  No carcinoma identified in one lymph node (0/1)  -  See comment   E. LYMPH NODE, SENTINEL, LEFT OBTURATOR, EXCISION:  -  No  carcinoma identified in one lymph node (0/1)  -  See comment URGICAL PATHOLOGY    05/01/2020 Cancer Staging   Staging form: Corpus Uteri - Carcinoma and Carcinosarcoma, AJCC 8th Edition - Clinical stage from 05/01/2020: FIGO Stage IB (cT1b, cN0(sn), cM0) - Signed by [name], MD on 05/08/2020    06/11/2020 Imaging   1. No findings of recurrent malignancy. 2. There are a few scattered small subcutaneous nodules primarily along the upper abdominal back, probably small foci of subcutaneous inflammation or small subcutaneous lymph nodes. 3. Peripheral enhancement in the dome of segment 7 of the liver, possibly a flash filling hemangioma but technically nonspecific. This could be further characterized with hepatic protocol MRI with and without contrast if clinically warranted. 4. 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 There is also some sclerosis in the left T10 and T11 pedicle, mildly increased from 2013, and probably from costovertebral arthropathy. 5. Dextroconvex thoracic scoliosis. 6. Mild cardiomegaly. 7. Facet arthropathy in the lumbar spine especially on the left at L4-5.   06/23/2020 - 07/07/2020 Radiation Therapy   06/23/2020 through 07/07/2020 Site Technique Total Dose (Gy) Dose per Fx (Gy) Completed Fx Beam Energies  Vagina: Pelvis HDR-brachy 30/30 6 5/5 Ir-192       07/07/2020 Genetic Testing   Negative genetic testing: no pathogenic variants detected in [facility] CancerNext-Expanded +RNAinsight Panel.  The report date is July 07, 2020.   The CancerNext-Expanded gene panel offered by [facility] and includes sequencing, rearrangement, and RNA analysis for the following 77 genes: AIP, ALK, APC, ATM, AXIN2,  BAP1, BARD1, BLM, BMPR1A, BRCA1, BRCA2, BRIP1, CDC73, CDH1, CDK4, CDKN1B, CDKN2A, CHEK2, CTNNA1, DICER1, FANCC, FH, FLCN, GALNT12, KIF1B, LZTR1, MAX, MEN1, MET, MLH1, MSH2,  MSH3, MSH6, MUTYH, NBN, NF1, NF2, NTHL1, PALB2, PHOX2B, PMS2, POT1, PRKAR1A, PTCH1, PTEN, RAD51C, RAD51D, RB1, RECQL, RET, SDHA, SDHAF2, SDHB, SDHC, SDHD, SMAD4, SMARCA4, SMARCB1, SMARCE1, STK11, SUFU, TMEM127, TP53, TSC1, TSC2, VHL and XRCC2 (sequencing and deletion/duplication); EGFR, EGLN1, HOXB13, KIT, MITF, PDGFRA, POLD1, and POLE (sequencing only); EPCAM and GREM1 (deletion/duplication only).      Interval History: She was recently seen in the emergency department with dizziness and chest pain.  Work-up was rather unrevealing.  She saw her primary care doctor on 20 December, the day after her ED visit.  Treatment for vertigo was discussed.  Blood pressure was okay at that appointment and no changes were made to blood pressure medication.  She saw Dr.Kinard last on 10/10, she was NED at that time.  Reports today overall doing very well.  Is still struggling with some vertigo today, but the last several days were better.  She has been doing the Epley maneuver twice a day and feels that this helped significantly.  She is checking her blood pressure at home and it is normal.  She denies any vaginal bleeding or discharge.  She is using her vaginal dilator 3 times a week.  Reports regular bowel and bladder function.  Denies any abdominal or pelvic pain.  Endorses good appetite without nausea or emesis.  Past Medical/Surgical History: Past Medical History:  Diagnosis Date   Anemia 2013   Iron Deficiency Anemia   Bradycardia 03/2012   Event monitor 8/13: NSR, sinus bradycardia, sinus tachycardia, no significant bradycardia arrhythmias   Chest pain 03/22/2012   Normal Myoview, normal echo, chest CT negative for pulmonary embolism   endometrial ca 04/2020   Family history of breast cancer 06/17/2020   History of colonoscopy    History of CT scan 2021   Blair Promise, MD   History of nuclear stress test 03/2012   Myoview 03/23/12: EF 50%, no ischemia or scar.   History of radiation therapy  06/23/20-07/07/20   vaginal brachytherapy - HDR- Endometrium, Dr. Sondra Come   HTN (hypertension) 03/2012   2-D echocardiogram 03/22/12: EF 50%, mild LAE, no wall motion abnormalities.   Tubal pregnancy aprox 1998   In her late 69s   Vitamin D deficiency 2013    Past Surgical History:  Procedure Laterality Date   ABDOMINAL HYSTERECTOMY  2021   Blair Promise, MD, PhD   CESAREAN SECTION     x2   ECTOPIC PREGNANCY SURGERY     ROBOTIC ASSISTED TOTAL HYSTERECTOMY WITH BILATERAL SALPINGO OOPHERECTOMY N/A 05/01/2020   Procedure: XI ROBOTIC ASSISTED TOTAL HYSTERECTOMY WITH BILATERAL SALPINGO OOPHORECTOMY AND MINI LAPAROTOMY;  Surgeon: [name], MD;  Location: [facility];  Service: Gynecology;  Laterality: N/A;   SENTINEL NODE BIOPSY N/A 05/01/2020   Procedure: SENTINEL NODE BIOPSY;  Surgeon: [name], MD;  Location: [facility];  Service: Gynecology;  Laterality: N/A;   TUBAL LIGATION      Family History  Problem Relation Age of Onset   Diabetes type II Sister    Hypertension Mother    Hypertension Father    Hypertension Sister    Breast cancer Maternal Aunt        dx 69s   Breast cancer Cousin        maternal cousin; dx 22s   Breast cancer Cousin  dx early 75s   Hypertension Daughter     Social History   Socioeconomic History   Marital status: Married    Spouse name: Not on file   Number of children: 2   Years of education: Not on file   Highest education level: Not on file  Occupational History   Occupation: Designer, industrial/product    Comment: [employer]  Tobacco Use   Smoking status: Former    Packs/day: 0.20    Years: 25.00    Pack years: 5.00    Types: Cigarettes    Quit date: 09/29/2016    Years since quitting: 4.9   Smokeless tobacco: Never  Vaping Use   Vaping Use: Never used  Substance and Sexual Activity   Alcohol use: Yes    Comment: occasional   Drug use: Yes    Types: Marijuana    Comment: occ    Sexual activity: Yes  Other Topics  Concern   Not on file  Social History Narrative   Tobacco use, amount per day now: Quit 4 years ago.   Past tobacco use, amount per day: 1 pack per day.   How many years did you use tobacco: Age [age]   Alcohol use (drinks per week): Special Occassions   Diet: No   Do you drink/eat things with caffeine: Occasional Soda.   Marital status: Married                                 What year were you married? 2020   Do you live in a house, apartment, assisted living, condo, trailer, etc.? House   Is it one or more stories? 3 stories.   How many persons live in your home? 3   Do you have pets in your home?( please list) Yes, 2 Dogs.    Highest Level of education completed? 12th Grade   Current or past profession: Mover/Packer   Do you exercise? Yes                                 Type and how often? Walking, Cardio, and Weights x 3 days a week.   Do you have a living will? No   Do you have a DNR form?   No                                If not, do you want to discuss one?   Do you have signed POA/HPOA forms? No                       If so, please bring to you appointment      Do you have any difficulty bathing or dressing yourself? No.   Do you have any difficulty preparing food or eating? No.   Do you have any difficulty managing your medications? No.   Do you have any difficulty managing your finances? No.   Do you have any difficulty affording your medications? No.   Social Determinants of Health   Financial Resource Strain: Not on file  Food Insecurity: Not on file  Transportation Needs: Not on file  Physical Activity: Not on file  Stress: Not on file  Social Connections: Not on file    Current Medications:  Current Outpatient Medications:  amLODipine (NORVASC) 10 MG tablet, TAKE ONE TABLET BY MOUTH DAILY (Patient taking differently: Take 10 mg by mouth daily.), Disp: 90 tablet, Rfl: 1   calcium carbonate (TUMS - DOSED IN MG ELEMENTAL CALCIUM) 500 MG chewable tablet, Chew 1  tablet by mouth as needed for indigestion., Disp: , Rfl:    fluticasone (FLONASE) 50 MCG/ACT nasal spray, Place 1 spray into both nostrils daily as needed for allergies or rhinitis., Disp: , Rfl:    Meclizine HCl (ANTIVERT) 25 MG CHEW, Chew 1 tablet (25 mg total) by mouth 3 (three) times daily as needed., Disp: 20 tablet, Rfl: 0   Multiple Vitamin (MULTIVITAMIN) tablet, Take 1 tablet by mouth daily., Disp: , Rfl:    VITAMIN D PO, Take 5,000 Units by mouth daily., Disp: , Rfl:   Review of Systems: + dizziness, headache Denies appetite changes, fevers, chills, fatigue, unexplained weight changes. Denies hearing loss, neck lumps or masses, mouth sores, ringing in ears or voice changes. Denies cough or wheezing.  Denies shortness of breath. Denies chest pain or palpitations. Denies leg swelling. Denies abdominal distention, pain, blood in stools, constipation, diarrhea, nausea, vomiting, or early satiety. Denies pain with intercourse, dysuria, frequency, hematuria or incontinence. Denies hot flashes, pelvic pain, vaginal bleeding or vaginal discharge.   Denies joint pain, back pain or muscle pain/cramps. Denies itching, rash, or wounds. Denies numbness or seizures. Denies swollen lymph nodes or glands, denies easy bruising or bleeding. Denies anxiety, depression, confusion, or decreased concentration.  Physical Exam: BP (!) 158/101 (BP Location: Left Arm, Patient Position: Sitting)    Pulse 63    Temp 98.3 F (36.8 C) (Tympanic)    Resp 18    Ht _0  (1.575 m)    Wt 162 lb 3.2 oz (73.6 kg)    LMP 10/27/2016    SpO2 100%    BMI 29.67 kg/m  General: Alert, oriented, no acute distress. HEENT: Normocephalic, atraumatic, sclera anicteric. Chest: Clear to auscultation bilaterally.  No wheezes or rhonchi. Cardiovascular: Regular rate and rhythm, no murmurs. Abdomen: soft, nontender.  Normoactive bowel sounds.  No masses or hepatosplenomegaly appreciated.  Well-healed incisions. Extremities:  Grossly normal range of motion.  Warm, well perfused.  No edema bilaterally. Skin: No rashes or lesions noted. Lymphatics: No cervical, supraclavicular, or inguinal adenopathy. GU: Normal appearing external genitalia without erythema, excoriation, or lesions.  Speculum exam reveals mildly atrophic vaginal mucosa, radiation changes present.  No bleeding or discharge, no lesions.  Bimanual exam reveals cuff intact, no nodularity or masses.  Rectovaginal exam confirms these findings.  Laboratory & Radiologic Studies: None new  Assessment & Plan: Christy Waller is a 51 y.o. 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 On [id number].  Completed adjuvant vaginal brachytherapy in November/2021.  Patient is doing well from a cancer standpoint and is NED on exam today.  Per study protocol, will continue with every 3 month surveillance visits for 3 years and then transition to visits every 6 months.  Reviewed signs and symptoms that should prompt a phone call between visits.  Patient was encouraged to follow-up with her primary care provider regarding vertigo symptoms.  Patient's blood pressure was elevated today.  She reports headache and vertigo symptoms are related.  Denies other symptoms including vision changes.  Assures me she will check her blood pressure at home.  28 minutes of total time was spent for this patient encounter, including preparation, face-to-face counseling with the patient  and coordination of care, and documentation of the encounter.  Jeral Pinch, MD  Division of Gynecologic Oncology  Department of Obstetrics and Gynecology  [facility]

## 2021-08-24 NOTE — Patient Instructions (Signed)
It was great to see you today!  I do not see or feel any evidence of cancer recurrence on your exam.  I will see you back in 6 months.  Please call if anything changes before then.

## 2021-08-24 NOTE — Research (Signed)
[id number] Arm 1, Follow-Up 1 Visit Patient comes into clinic today by herself for first follow up visit since completing one year on the study. Study protocol does not require clinic visits at this time. Dr. Berline Lopes is seeing patient as part of routine follow up in which patient would normally be examined every 3 months for first 2 years after treatment.  Con Meds Reviewed medication list with patient. She reports she started taking prilosec daily as needed for heartburn and it has been helping. She is also taking meclizine for vertigo which is also helping. Medication list updated. H&P Completed by Dr.Tucker today. Adverse Events 05/25/2021- 08/24/2021 No SAEs and no hospitalizations since last study visit. Patient was seen in ED in December for dizziness (grade 2), chest pain (grade 2) and HTN (grade 3). She was discharged to home without being admitted to hospital. Upon follow up with PCP she was given meclizine and exercises to do for the dizziness and patient states it has improved.  Pt has ongoing HTN which was grade 3 on recent ED visit and grade 3 again on this clinic visit today. Chart review of BPs over the past 5 years reveals mostly grade 2 HTN with several episodes of grade 3 prior to study enrollment.  Patient reports her PCP states she does not require any further intervention or medication changes at this time for HTN. Her blood pressure has been okay at home and was okay when she saw her PCP last month. Patient is scheduled to see cardiologist next week.  Plan Follow Up requirements clarified with study and they replied that protocol does not currently require clinic visit or MD exam at follow up visits. It is recommended, but not required. Plan per Dr. Sondra Come and Dr. Berline Lopes is for patient to be examined every 3 months for at least the next year with visits alternating between them.  Next visit with Dr. Sondra Come is due 11/23/21 and patient requests it be scheduled for 11/24/21 due to her work  schedule.  Research nurse will call patient with schedule when it is complete.  Thanked patient for her ongoing participation in this clinical trial. Encouraged patient to contact research nurse if any questions or concerns prior to next contact. Patient verbalized understanding.  Foye Spurling, BSN, RN Clinical Research Nurse 08/24/2021 3:41 PM

## 2021-08-31 ENCOUNTER — Ambulatory Visit: Payer: 59 | Admitting: Cardiology

## 2021-09-02 ENCOUNTER — Telehealth: Payer: Self-pay | Admitting: *Deleted

## 2021-09-02 NOTE — Telephone Encounter (Signed)
[facility] GY020; Informed patient of appt with Dr. Sondra Come on 11/24/21 with lab to follow.  Informed patient lab is needed to check kidney function prior to CT scan due in May. Patient confirmed appointment.  Foye Spurling, BSN, RN, [employer] Clinical Research Nurse 09/02/2021 10:43 AM

## 2021-11-04 ENCOUNTER — Other Ambulatory Visit: Payer: Self-pay | Admitting: *Deleted

## 2021-11-04 DIAGNOSIS — C541 Malignant neoplasm of endometrium: Secondary | ICD-10-CM

## 2021-11-23 ENCOUNTER — Telehealth: Payer: Self-pay | Admitting: *Deleted

## 2021-11-23 NOTE — Progress Notes (Signed)
?Radiation Oncology         [phone]) [phone] ?________________________________ ? ?Name: Christy Waller MRN: [id number]  ?Date: 11/24/2021  DOB: 10-Oct-1970 ? ?Follow-Up Visit Note ? ?NRG-GY020 Protocol ? ?CC: Wardell Honour, MD  [facility] ? ?  ICD-10-CM   ?1. Endometrial cancer ([place])  C54.1   ?  ? ? 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 ?Interval Since Last Radiation:  1 year, 4 months, and 20 days  ? ?Radiation Treatment Dates: 06/23/2020 through 07/07/2020 ?Site Technique Total Dose (Gy) Dose per Fx (Gy) Completed Fx Beam Energies  ?Vagina: Pelvis HDR-brachy 30/30 6 5/5 Ir-192  ? ? ?Narrative:  The patient returns today for routine follow-up, she was last seen here for follow up on 05/25/21. Since her last visit, the patient presented to the ED on 08/13/21 following a near syncopic episode. The patient reported that she began to feel lightheaded and dizzy while she was cleaning a home. Enroute to the ED she also endorsed some chest discomfort. Chest x-ray performed for evaluation was unremarkable. She did not experience any further, or worsening symptoms in the ED, and was discharged home with instructions for PCP follow-up.  ? ?The patient also followed up with Dr. Berline Lopes on 08/24/21. During which time, the patient reported doing well overall other than some ongoing vertigo. She reported that she saw her PCP the day after her ED visit who recommended doing the Epley maneuver twice a day which she notes helped her significantly. She otherwise denied any vaginal bleeding or discharge and was noted as NED on examination.  She also reported using her vaginal dilator 3 times a week.      ? ?Imaging performed since the patient was last seen includes: ?-- CT of the chest abdomen and pelvis on 06/16/21 showed: no evidence of lymphadenopathy or metastatic disease in the ?chest, abdomen, or pelvis, and an unchanged hyperdense lesion in the posterior dome of the  right lobe of the liver (hepatic segment VII), subtle and ill-defined on this examination, measuring 1.3 cm, and noted as most likely a small ?flash filling hemangioma. CT also showed subcutaneous nodules scattered throughout the abdomen and pelvis ?as unchanged in the interval; noted as likely benign sequelae of nonspecific cutaneous infection or inflammation.        ? ?She denies any abdominal bloating or pelvic pain.  She denies any hematuria vaginal bleeding or rectal bleeding.  She denies seeing any blood after using her vaginal dilator.  She denies any postcoital bleeding or pain.               ? ?Allergies:  is allergic to lisinopril. ? ?Meds: ?Current Outpatient Medications  ?Medication Sig Dispense Refill  ? amLODipine (NORVASC) 10 MG tablet TAKE ONE TABLET BY MOUTH DAILY (Patient taking differently: Take 10 mg by mouth daily.) 90 tablet 1  ? calcium carbonate (TUMS - DOSED IN MG ELEMENTAL CALCIUM) 500 MG chewable tablet Chew 1 tablet by mouth as needed for indigestion.    ? fluticasone (FLONASE) 50 MCG/ACT nasal spray Place 1 spray into both nostrils daily as needed for allergies or rhinitis.    ? Meclizine HCl 25 MG CHEW CHEW ONE TABLET BY MOUTH THREE TIMES A DAY AS NEEDED 20 tablet 1  ? Multiple Vitamin (MULTIVITAMIN) tablet Take 1 tablet by mouth daily.    ? omeprazole (PRILOSEC) 20 MG capsule Take 20 mg by mouth daily as needed.    ?  VITAMIN D PO Take 5,000 Units by mouth daily.    ? ?No current facility-administered medications for this encounter.  ? ? ?Physical Findings: ?The patient is in no acute distress. Patient is alert and oriented. ? height is '5\' 2"'$  (1.575 m) and weight is 167 lb 3.2 oz (75.8 kg). Her temperature is 97.3 ?F (36.3 ?C) (abnormal). Her blood pressure is 153/93 (abnormal) and her pulse is 71. Her respiration is 20 and oxygen saturation is 100%. .  No significant changes. Lungs are clear to auscultation bilaterally. Heart has regular rate and rhythm. No palpable cervical,  supraclavicular, or axillary adenopathy. Abdomen soft, non-tender, normal bowel sounds. ? ?On pelvic examination the external genitalia were unremarkable. A speculum exam was performed. There are no mucosal lesions noted in the vaginal vault.  Good view of the vaginal cuff was obtained.  On bimanual and rectovaginal examination there were no pelvic masses appreciated.  Vaginal cuff intact.  Rectal sphincter tone normal. ? ?ECOG=0 ? ?Lab Findings: ?Lab Results  ?Component Value Date  ? WBC 9.4 08/03/2021  ? HGB 13.2 08/03/2021  ? HCT 39.6 08/03/2021  ? MCV 85.3 08/03/2021  ? PLT 252 08/03/2021  ? ? ?Radiographic Findings: ?No results found. ? 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 ?No evidence of recurrence on clinical exam today.  Patient does not appear to exhibit any toxicities from her vaginal brachytherapy.  She will proceed with blood work later today and then a CT scan of the abdomen and pelvis later this spring per NRG protocol guidelines. ? ?Plan: She will follow-up with Dr. Berline Lopes in 3 months.  Radiation oncology and 6 months. ? ? ?25 minutes of total time was spent for this patient encounter, including preparation, face-to-face counseling with the patient and coordination of care, physical exam, and documentation of the encounter. ?____________________________________ ? ?Blair Promise, PhD, MD ? ?This document serves as a record of services personally performed by Gery Pray, MD. It was created on his behalf by Roney Mans, a trained medical scribe. The creation of this record is based on the scribe's personal observations and the provider's statements to them. This document has been checked and approved by the attending provider. ? ?

## 2021-11-23 NOTE — Telephone Encounter (Signed)
CALLED PATIENT TO REMIND OF FU WITH DR. Stem ON 11-24-21 @ 11:30 AM, SPOKE WITH PATIENT AND SHE IS AWARE OF THIS APPT. ?

## 2021-11-24 ENCOUNTER — Other Ambulatory Visit: Payer: Self-pay

## 2021-11-24 ENCOUNTER — Ambulatory Visit
Admission: RE | Admit: 2021-11-24 | Discharge: 2021-11-24 | Disposition: A | Discharge status: Home / Self Care | Payer: Self-pay | Source: Ambulatory Visit | Attending: Radiation Oncology | Admitting: Radiation Oncology

## 2021-11-24 ENCOUNTER — Encounter: Payer: Self-pay | Admitting: Radiation Oncology

## 2021-11-24 ENCOUNTER — Inpatient Hospital Stay: Payer: Self-pay | Attending: Radiation Oncology

## 2021-11-24 ENCOUNTER — Encounter: Payer: Self-pay | Admitting: *Deleted

## 2021-11-24 VITALS — BP 153/93 | HR 71 | Temp 97.3°F | Resp 20 | Ht 62.0 in | Wt 167.2 lb

## 2021-11-24 DIAGNOSIS — Z8542 Personal history of malignant neoplasm of other parts of uterus: Secondary | ICD-10-CM | POA: Insufficient documentation

## 2021-11-24 DIAGNOSIS — C541 Malignant neoplasm of endometrium: Secondary | ICD-10-CM | POA: Insufficient documentation

## 2021-11-24 DIAGNOSIS — R42 Dizziness and giddiness: Secondary | ICD-10-CM | POA: Insufficient documentation

## 2021-11-24 DIAGNOSIS — Z923 Personal history of irradiation: Secondary | ICD-10-CM | POA: Insufficient documentation

## 2021-11-24 DIAGNOSIS — Z79899 Other long term (current) drug therapy: Secondary | ICD-10-CM | POA: Insufficient documentation

## 2021-11-24 LAB — BUN & CREATININE (CHCC)
BUN: 15 mg/dL (ref 6–20)
Creatinine: 0.73 mg/dL (ref 0.44–1.00)
GFR, Estimated: >60 mL/min

## 2021-11-24 NOTE — Research (Signed)
NRG-[id number] Arm 1, Follow-Up Visit 2 ?Patient comes into clinic today by herself for follow up visit 2 since completing one year on the study.   ? ?H&P Completed by Dr. Sondra Come today. See his encounter note for details.  ? ?Adverse Events No SAEs and no hospitalizations since last study visit.   ? ?Plan  CT Scan is scheduled for 12/22/21 at 10:30 am.  Patient given written instructions and oral contrast to drink prior to exam. Instructed to arrive at Admitting 30 minutes early and nothing to eat or drink (other than the contrast) for 4 hours prior to exam. Instructed patient to call radiology scheduler if she needs to reschedule visit for any reason and to let research nurse know so we can try to keep it within the study protocol defined window. Patient verbalized understanding. Patient was escorted to the lab for BUN/Creat to be done as a requirement for the upcoming CT scan.  ?H & P with clinical exam will continue to be scheduled every 3 months alternating between Dr. Sondra Come and Dr. Berline Lopes. Email correspondence with the study chair confirmed it is okay for patient to be examined by a physician not on the study as long as we can get the record of the exam. Therefore patient can continue to alternate between Dr. Sondra Come and Dr. Berline Lopes without deviating from the protocol for follow up visits. Next visit with Dr. Berline Lopes is due in 3 months and Dr. Sondra Come in 6 months.   ?Scheduler will call patient with appointment d/t for appointments.  ?Thanked patient for her ongoing participation in this clinical trial. Encouraged patient to contact research nurse if any questions or concerns prior to next contact. Patient verbalized understanding.  ?Foye Spurling, BSN, RN ?Clinical Research Nurse ?11/24/2021 11:45 AM ? ?

## 2021-11-24 NOTE — Progress Notes (Signed)
Christy Waller is here today for follow up post radiation to the pelvic. ? ?They completed their radiation on: 07/07/20 ? ?Does the patient complain of any of the following: ? ?Pain: No ?Abdominal bloating: No ?Diarrhea/Constipation: No ?Nausea/Vomiting: No ?Vaginal Discharge: No ?Blood in Urine or Stool: No ?Urinary Issues (dysuria/incomplete emptying/ incontinence/ increased frequency/urgency): No ?Does patient report using vaginal dilator 2-3 times a week and/or sexually active 2-3 weeks: Yes, patient using vaginal dilators and being sexually active.  ?Post radiation skin changes: No ? ? ?Additional comments if applicable: ? Patient reports having episodes of vertigo. Patient has been taking Meclizine.  ? ?Vitals:  ? 11/24/21 1129  ?BP: (!) 153/93  ?Pulse: 71  ?Resp: 20  ?Temp: (!) 97.3 ?F (36.3 ?C)  ?SpO2: 100%  ?Weight: 167 lb 3.2 oz (75.8 kg)  ?Height: '5\' 2"'$  (1.575 m)  ?  ?

## 2021-12-22 ENCOUNTER — Ambulatory Visit (HOSPITAL_COMMUNITY)
Admission: RE | Admit: 2021-12-22 | Discharge: 2021-12-22 | Disposition: A | Discharge status: Home / Self Care | Payer: Self-pay | Source: Ambulatory Visit | Attending: Radiation Oncology | Admitting: Radiation Oncology

## 2021-12-22 DIAGNOSIS — C541 Malignant neoplasm of endometrium: Secondary | ICD-10-CM

## 2021-12-22 MED ORDER — IOHEXOL 300 MG/ML  SOLN
100.0000 mL | Freq: Once | INTRAMUSCULAR | Status: AC | PRN
  Administered 2021-12-22: 100 mL via INTRAVENOUS

## 2021-12-22 MED ORDER — SODIUM CHLORIDE (PF) 0.9 % IJ SOLN
INTRAMUSCULAR | Status: AC
  Filled 2021-12-22: qty 50

## 2021-12-24 ENCOUNTER — Telehealth: Payer: Self-pay | Admitting: *Deleted

## 2021-12-24 NOTE — Telephone Encounter (Signed)
Scheduled the patient to see Dr Berline Lopes on 7/10 at 2:30 pm. Patient aware  ?

## 2022-01-11 ENCOUNTER — Other Ambulatory Visit: Payer: Self-pay | Admitting: Family Medicine

## 2022-01-11 DIAGNOSIS — I10 Essential (primary) hypertension: Secondary | ICD-10-CM

## 2022-01-20 ENCOUNTER — Encounter: Payer: Self-pay | Admitting: Family Medicine

## 2022-01-20 ENCOUNTER — Ambulatory Visit: Payer: Self-pay | Admitting: Family Medicine

## 2022-01-20 VITALS — BP 124/82 | HR 80 | Temp 96.9°F | Ht 62.0 in | Wt 171.2 lb

## 2022-01-20 DIAGNOSIS — E669 Obesity, unspecified: Secondary | ICD-10-CM

## 2022-01-20 DIAGNOSIS — C541 Malignant neoplasm of endometrium: Secondary | ICD-10-CM

## 2022-01-20 DIAGNOSIS — Z803 Family history of malignant neoplasm of breast: Secondary | ICD-10-CM

## 2022-01-20 DIAGNOSIS — I10 Essential (primary) hypertension: Secondary | ICD-10-CM

## 2022-01-20 MED ORDER — PHENTERMINE HCL 15 MG PO CAPS: 15.0000 mg | ORAL_CAPSULE | ORAL | 2 refills | Status: DC

## 2022-01-20 NOTE — Progress Notes (Signed)
Provider:  Alain Honey, MD  Careteam: Patient Care Team: Wardell Honour, MD as PCP - General (Family Medicine)  PLACE OF SERVICE:  [place]  Advanced Directive information    Allergies  Allergen Reactions   Lisinopril Swelling    Angioedema    Chief Complaint  Patient presents with   Medical Management of Chronic Issues    Patient presents today for a 1 year follow-up   Quality Metric Gaps    Mammogram,pap smear, Hep C screening, zoster, TDAP, COVID booster #4     HPI: Patient is a 51 y.o. female  This is a 1 year follow-up for hypertension.  Since I have seen her she has also been seen for dizziness that seem to respond to Epley maneuver. Today she is most concerned about weight gain.  She is asking for help to lose weight.  We discussed various methods.  I stressed the importance of carbs and she is aware of this correlation.  She is asking for diet pills.  I explained that we can use these for the short-term and to try and affect some behavior modification but it not meant to be used long-term and she is in agreement.  Otherwise has some complaints of pain in her knees Review of Systems:  Review of Systems  Constitutional: Negative.   HENT: Negative.    Respiratory: Negative.    Cardiovascular: Negative.   Musculoskeletal:  Positive for joint pain.  All other systems reviewed and are negative.  Past Medical History:  Diagnosis Date   Anemia 2013   Iron Deficiency Anemia   Bradycardia 03/2012   Event monitor 8/13: NSR, sinus bradycardia, sinus tachycardia, no significant bradycardia arrhythmias   Chest pain 03/22/2012   Normal Myoview, normal echo, chest CT negative for pulmonary embolism   endometrial ca 04/2020   Family history of breast cancer 06/17/2020   History of colonoscopy    History of CT scan 2021   Blair Promise, MD   History of nuclear stress test 03/2012   Myoview 03/23/12: EF 50%, no ischemia or scar.   History of radiation therapy  06/23/20-07/07/20   vaginal brachytherapy - HDR- Endometrium, Dr. Sondra Come   HTN (hypertension) 03/2012   2-D echocardiogram 03/22/12: EF 50%, mild LAE, no wall motion abnormalities.   Tubal pregnancy aprox 1998   In her late 66s   Vitamin D deficiency 2013   Past Surgical History:  Procedure Laterality Date   ABDOMINAL HYSTERECTOMY  2021   Blair Promise, MD, PhD   CESAREAN SECTION     x2   ECTOPIC PREGNANCY SURGERY     ROBOTIC ASSISTED TOTAL HYSTERECTOMY WITH BILATERAL SALPINGO OOPHERECTOMY N/A 05/01/2020   Procedure: XI ROBOTIC ASSISTED TOTAL HYSTERECTOMY WITH BILATERAL SALPINGO OOPHORECTOMY AND MINI LAPAROTOMY;  Surgeon: [name], MD;  Location: [facility];  Service: Gynecology;  Laterality: N/A;   SENTINEL NODE BIOPSY N/A 05/01/2020   Procedure: SENTINEL NODE BIOPSY;  Surgeon: [name], MD;  Location: [facility];  Service: Gynecology;  Laterality: N/A;   TUBAL LIGATION     Social History:   reports that she quit smoking about 5 years ago. Her smoking use included cigarettes. She has a 5.00 pack-year smoking history. She has never used smokeless tobacco. She reports current alcohol use. She reports current drug use. Drug: Marijuana.  Family History  Problem Relation Age of Onset   Diabetes type II Sister    Hypertension Mother    Hypertension Father  Hypertension Sister    Breast cancer Maternal Aunt        dx 7s   Breast cancer Cousin        maternal cousin; dx 73s   Breast cancer Cousin        dx early 45s   Hypertension Daughter     Medications: Patient's Medications  New Prescriptions   PHENTERMINE 15 MG CAPSULE    Take 1 capsule (15 mg total) by mouth every morning.  Previous Medications   AMLODIPINE (NORVASC) 10 MG TABLET    TAKE ONE TABLET BY MOUTH DAILY   CALCIUM CARBONATE (TUMS - DOSED IN MG ELEMENTAL CALCIUM) 500 MG CHEWABLE TABLET    Chew 1 tablet by mouth as needed for indigestion.   FLUTICASONE (FLONASE) 50 MCG/ACT NASAL SPRAY    Place 1 spray  into both nostrils daily as needed for allergies or rhinitis.   MECLIZINE HCL 25 MG CHEW    CHEW ONE TABLET BY MOUTH THREE TIMES A DAY AS NEEDED   MULTIPLE VITAMIN (MULTIVITAMIN) TABLET    Take 1 tablet by mouth daily.   OMEPRAZOLE (PRILOSEC) 20 MG CAPSULE    Take 20 mg by mouth daily as needed.   VITAMIN D PO    Take 5,000 Units by mouth daily.  Modified Medications   No medications on file  Discontinued Medications   No medications on file    Physical Exam:  Vitals:   01/20/22 0851  BP: 124/82  Pulse: 80  Temp: (!) 96.9 F (36.1 C)  SpO2: 98%  Weight: 171 lb 3.2 oz (77.7 kg)  Height: '5\' 2"'$  (1.575 m)   Body mass index is 31.31 kg/m. Wt Readings from Last 3 Encounters:  01/20/22 171 lb 3.2 oz (77.7 kg)  11/24/21 167 lb 3.2 oz (75.8 kg)  08/24/21 162 lb 3.2 oz (73.6 kg)    Physical Exam Vitals and nursing note reviewed.  Constitutional:      Appearance: She is obese.  HENT:     Right Ear: Tympanic membrane normal.     Nose: Nose normal.  Cardiovascular:     Rate and Rhythm: Normal rate and regular rhythm.  Pulmonary:     Effort: Pulmonary effort is normal.     Breath sounds: Normal breath sounds.  Musculoskeletal:        General: Normal range of motion.  Neurological:     General: No focal deficit present.     Mental Status: She is alert.    Labs reviewed: Basic Metabolic Panel: Recent Labs    05/25/21 1044 08/03/21 1531 11/24/21 1156  NA  --  138  --   K  --  3.6  --   CL  --  105  --   CO2  --  26  --   GLUCOSE  --  105*  --   BUN '14 12 15  '$ CREATININE 0.87 0.70 0.73  CALCIUM  --  9.7  --    Liver Function Tests: No results for input(s): AST, ALT, ALKPHOS, BILITOT, PROT, ALBUMIN in the last 8760 hours. No results for input(s): LIPASE, AMYLASE in the last 8760 hours. No results for input(s): AMMONIA in the last 8760 hours. CBC: Recent Labs    08/03/21 1531  WBC 9.4  NEUTROABS 7.0  HGB 13.2  HCT 39.6  MCV 85.3  PLT 252   Lipid Panel: No  results for input(s): CHOL, HDL, LDLCALC, TRIG, CHOLHDL, LDLDIRECT in the last 8760 hours. TSH: No results for input(s):  TSH in the last 8760 hours. A1C: Lab Results  Component Value Date   HGBA1C 5.1 04/28/2020     Assessment/Plan  1. Obesity (BMI 30-39.9) Rx for phentermine 15 mg provided.  Plan to limit to 3 or 4 months  2. Primary hypertension Blood pressure today is good at 124/82.  Continue amlodipine 10 mg  3. Endometrial cancer [facility]) Status post total hysterectomy followed in research protocol by oncology  4. Family history of breast cancer Patient is due for mammogram and encouraged her to follow-up with that exam   Alain Honey, MD [place] [phone]

## 2022-01-21 LAB — COMPLETE METABOLIC PANEL WITH GFR
AG Ratio: 1.3 (calc) (ref 1.0–2.5)
ALT: 7 U/L (ref 6–29)
AST: 12 U/L (ref 10–35)
Albumin: 4.1 g/dL (ref 3.6–5.1)
Alkaline phosphatase (APISO): 85 U/L (ref 37–153)
BUN: 16 mg/dL (ref 7–25)
CO2: 28 mmol/L (ref 20–32)
Calcium: 9.9 mg/dL (ref 8.6–10.4)
Chloride: 106 mmol/L (ref 98–110)
Creat: 0.68 mg/dL (ref 0.50–1.03)
Globulin: 3.2 g/dL (calc) (ref 1.9–3.7)
Glucose, Bld: 84 mg/dL (ref 65–99)
Potassium: 4.8 mmol/L (ref 3.5–5.3)
Sodium: 141 mmol/L (ref 135–146)
Total Bilirubin: 0.2 mg/dL (ref 0.2–1.2)
Total Protein: 7.3 g/dL (ref 6.1–8.1)
eGFR: 105 mL/min/{1.73_m2} (ref >=60)

## 2022-01-21 LAB — LIPID PANEL
Cholesterol: 171 mg/dL (ref <200)
HDL: 82 mg/dL (ref >=50)
LDL Cholesterol (Calc): 75 mg/dL (calc)
Non-HDL Cholesterol (Calc): 89 mg/dL (calc) (ref <130)
Total CHOL/HDL Ratio: 2.1 (calc) (ref <5.0)
Triglycerides: 67 mg/dL (ref <150)

## 2022-01-21 LAB — CBC WITH DIFFERENTIAL/PLATELET
Absolute Monocytes: 476 cells/uL (ref 200–950)
Basophils Absolute: 49 cells/uL (ref 0–200)
Basophils Relative: 0.7 %
Eosinophils Absolute: 119 cells/uL (ref 15–500)
Eosinophils Relative: 1.7 %
HCT: 41.8 % (ref 35.0–45.0)
Hemoglobin: 13.3 g/dL (ref 11.7–15.5)
Lymphs Abs: 1379 cells/uL (ref 850–3900)
MCH: 27.9 pg (ref 27.0–33.0)
MCHC: 31.8 g/dL — ABNORMAL LOW (ref 32.0–36.0)
MCV: 87.8 fL (ref 80.0–100.0)
MPV: 10.4 fL (ref 7.5–12.5)
Monocytes Relative: 6.8 %
Neutro Abs: 4977 cells/uL (ref 1500–7800)
Neutrophils Relative %: 71.1 %
Platelets: 276 10*3/uL (ref 140–400)
RBC: 4.76 10*6/uL (ref 3.80–5.10)
RDW: 14.2 % (ref 11.0–15.0)
Total Lymphocyte: 19.7 %
WBC: 7 10*3/uL (ref 3.8–10.8)

## 2022-01-21 LAB — HEMOGLOBIN A1C
Hgb A1c MFr Bld: 5.2 % of total Hgb (ref <5.7)
Mean Plasma Glucose: 103 mg/dL
eAG (mmol/L): 5.7 mmol/L

## 2022-02-19 ENCOUNTER — Encounter: Payer: Self-pay | Admitting: Gynecologic Oncology

## 2022-02-22 ENCOUNTER — Other Ambulatory Visit: Payer: Self-pay

## 2022-02-22 ENCOUNTER — Encounter: Payer: Self-pay | Admitting: *Deleted

## 2022-02-22 ENCOUNTER — Encounter: Payer: Self-pay | Admitting: Gynecologic Oncology

## 2022-02-22 ENCOUNTER — Inpatient Hospital Stay: Payer: Self-pay | Attending: Radiation Oncology | Admitting: Gynecologic Oncology

## 2022-02-22 VITALS — BP 145/92 | HR 66 | Temp 98.4°F | Resp 16 | Ht 62.0 in | Wt 166.6 lb

## 2022-02-22 DIAGNOSIS — C541 Malignant neoplasm of endometrium: Secondary | ICD-10-CM

## 2022-02-22 DIAGNOSIS — Z923 Personal history of irradiation: Secondary | ICD-10-CM | POA: Insufficient documentation

## 2022-02-22 DIAGNOSIS — Z90710 Acquired absence of both cervix and uterus: Secondary | ICD-10-CM | POA: Insufficient documentation

## 2022-02-22 DIAGNOSIS — Z90722 Acquired absence of ovaries, bilateral: Secondary | ICD-10-CM | POA: Insufficient documentation

## 2022-02-22 DIAGNOSIS — Z8542 Personal history of malignant neoplasm of other parts of uterus: Secondary | ICD-10-CM | POA: Insufficient documentation

## 2022-02-22 DIAGNOSIS — Z9221 Personal history of antineoplastic chemotherapy: Secondary | ICD-10-CM | POA: Insufficient documentation

## 2022-02-22 NOTE — Research (Signed)
NRG-GY020 Arm 1, Follow-Up Visit 3 Patient comes into clinic today by herself for follow up visit 3 since completing one year on the study.    Re-Consent Reviewed new ICF for PVD 12/22/21, [place] Active Date 02/04/22, with patient prior to her appointment. We met in quiet corner of small waiting room and there were no other patient's in room at the time. 10 minutes was spent reviewing the new form. Informed patient of changes to ICF including changes in risks to Pembrolizumab, which patient did not receive. Informed patient of change from request to collect blood 2 times during the study to 3 times and addition of sentence that samples will be used to "look for genetic material from your tumor that may be in your blood."  Patient denied any questions to the updated consent form and agreed to continue in the study. She answered "yes" to both known and unknown future studies using her samples. She also answered "yes" to being contacted for future research. Patient signed and dated the ICF for PVD 12/22/2021, today at 2:26 PM.  Research nurse also signed and dated. Copy was given to patient for her records.   H&P Completed by Dr. Berline Lopes today. See her encounter note for details.   Adverse Events No SAEs and no hospitalizations since last study visit.    Plan  H & P with clinical exam will continue to be scheduled every 3 months alternating between Dr. Sondra Come and Dr. Berline Lopes.  Next visit with Dr. Sondra Come is due in 3 months and is scheduled for 06/01/22. Patient will see Dr. Berline Lopes again in January 2024.    CT scans continue every 6 months and next one is due 06/21/22.   Thanked patient for her ongoing participation in this clinical trial. Encouraged patient to contact research nurse if any questions or concerns prior to next contact. Patient verbalized understanding.  Foye Spurling, BSN, RN Clinical Research Nurse 02/22/2022 2:53 PM

## 2022-02-22 NOTE — Patient Instructions (Addendum)
It was good to see you today.  I do not see or feel any evidence of cancer recurrence on your exam.You see Dr. Sondra Come on 10/17 for follow-up.  Please call my office sometime in late November or December to get a follow-up scheduled with me in mid January.  As always, please call if you develop any new and concerning symptoms before your next follow-up.

## 2022-02-22 NOTE — Progress Notes (Signed)
Gynecologic Oncology Return Clinic Visit  02/22/22  Reason for Visit: Surveillance visit in the setting of high-intermediate risk uterine cancer  Treatment History: Oncology History Overview Note  MMR IHC - loss of MSH2 and 6 expression MSI - High NEGATIVE germline testing   Endometrial cancer [facility])   Initial Diagnosis   Endometrial cancer ([place])   04/17/2020 Initial Biopsy   EMB: Grade 2 endometrioid adenocarcinoma    05/01/2020 Surgery   Robotic-assisted laparoscopic total hysterectomy with bilateral salpingoophorectomy, SLN biopsy, mini-lap for specimen removal  On EUA, 10cm mobile uterus. On intra-abdominal entry, normal upper abdominal survey. Normal small and large bowel. Omentum adherent to the anterior abdominal wall. Sigmoid epiploica adherent to the posterior uterus. Uterus 10-12cm, bulbous fundus. Adnexa adherent to posterior uterus, otherwise normal appearing. Mapping successful to right external and right obturator SLN, left obturator SLN, right presacral. All lymph nodes prominent.  No intra-abdominal or pelvic evidence of disease otherwise.  Some adhesions between the bladder and lower uterine segment/cervix, consistent with patient's prior C-section history.   05/01/2020 Pathology Results   A. UTERUS, CERVIX, BILATERAL TUBES AND OVARIES, RESECTION:   Uterus:  -  Endometrioid carcinoma, FIGO grade 2  -  See oncology table and comment below   Cervix:  -  No carcinoma identified   Bilateral Ovaries:  -  No carcinoma identified   B. LYMPH NODE, SENTINEL, RIGHT OBTURATOR, EXCISION:  -  No carcinoma identified in one lymph node (0/1)  -  See comment   C. LYMPH NODE, SENTINEL, RIGHT EXTERNAL ILIAC, EXCISION:  -  No carcinoma identified in one lymph node (0/1)  -  See comment   D. LYMPH NODE, SENTINEL, RIGHT PRE-SACRAL, EXCISION:  -  No carcinoma identified in one lymph node (0/1)  -  See comment   E. LYMPH NODE, SENTINEL, LEFT OBTURATOR, EXCISION:  -  No  carcinoma identified in one lymph node (0/1)  -  See comment URGICAL PATHOLOGY    05/01/2020 Cancer Staging   Staging form: Corpus Uteri - Carcinoma and Carcinosarcoma, AJCC 8th Edition - Clinical stage from 05/01/2020: FIGO Stage IB (cT1b, cN0(sn), cM0) - Signed by [name], MD on 05/08/2020   06/11/2020 Imaging   1. No findings of recurrent malignancy. 2. There are a few scattered small subcutaneous nodules primarily along the upper abdominal back, probably small foci of subcutaneous inflammation or small subcutaneous lymph nodes. 3. Peripheral enhancement in the dome of segment 7 of the liver, possibly a flash filling hemangioma but technically nonspecific. This could be further characterized with hepatic protocol MRI with and without contrast if clinically warranted. 4. 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 There is also some sclerosis in the left T10 and T11 pedicle, mildly increased from 2013, and probably from costovertebral arthropathy. 5. Dextroconvex thoracic scoliosis. 6. Mild cardiomegaly. 7. Facet arthropathy in the lumbar spine especially on the left at L4-5.   06/23/2020 - 07/07/2020 Radiation Therapy   06/23/2020 through 07/07/2020 Site Technique Total Dose (Gy) Dose per Fx (Gy) Completed Fx Beam Energies  Vagina: Pelvis HDR-brachy 30/30 6 5/5 Ir-192       07/07/2020 Genetic Testing   Negative genetic testing: no pathogenic variants detected in [facility] CancerNext-Expanded +RNAinsight Panel.  The report date is July 07, 2020.   The CancerNext-Expanded gene panel offered by [facility] and includes sequencing, rearrangement, and RNA analysis for the following 77 genes: AIP, ALK, APC, ATM, AXIN2, BAP1,  BARD1, BLM, BMPR1A, BRCA1, BRCA2, BRIP1, CDC73, CDH1, CDK4, CDKN1B, CDKN2A, CHEK2, CTNNA1, DICER1, FANCC, FH, FLCN, GALNT12, KIF1B, LZTR1, MAX, MEN1, MET, MLH1, MSH2,  MSH3, MSH6, MUTYH, NBN, NF1, NF2, NTHL1, PALB2, PHOX2B, PMS2, POT1, PRKAR1A, PTCH1, PTEN, RAD51C, RAD51D, RB1, RECQL, RET, SDHA, SDHAF2, SDHB, SDHC, SDHD, SMAD4, SMARCA4, SMARCB1, SMARCE1, STK11, SUFU, TMEM127, TP53, TSC1, TSC2, VHL and XRCC2 (sequencing and deletion/duplication); EGFR, EGLN1, HOXB13, KIT, MITF, PDGFRA, POLD1, and POLE (sequencing only); EPCAM and GREM1 (deletion/duplication only).      Interval History: She saw Dr. Sondra Come in mid-April. She was doing well at that time.  Patient presents today.  Reports doing very well.  Denies any vaginal bleeding or discharge.  Endorses regular bowel and bladder function.  Denies any abdominal or pelvic pain.  Looking forward to taking a trip with her family to [place] next week.  Past Medical/Surgical History: Past Medical History:  Diagnosis Date   Anemia 2013   Iron Deficiency Anemia   Bradycardia 03/2012   Event monitor 8/13: NSR, sinus bradycardia, sinus tachycardia, no significant bradycardia arrhythmias   Chest pain 03/22/2012   Normal Myoview, normal echo, chest CT negative for pulmonary embolism   endometrial ca 04/2020   Family history of breast cancer 06/17/2020   History of colonoscopy    History of CT scan 2021   Blair Promise, MD   History of nuclear stress test 03/2012   Myoview 03/23/12: EF 50%, no ischemia or scar.   History of radiation therapy 06/23/20-07/07/20   vaginal brachytherapy - HDR- Endometrium, Dr. Sondra Come   HTN (hypertension) 03/2012   2-D echocardiogram 03/22/12: EF 50%, mild LAE, no wall motion abnormalities.   Tubal pregnancy aprox 1998   In her late 68s   Vitamin D deficiency 2013    Past Surgical History:  Procedure Laterality Date   ABDOMINAL HYSTERECTOMY  2021   Blair Promise, MD, PhD   CESAREAN SECTION     x2   ECTOPIC PREGNANCY SURGERY     ROBOTIC ASSISTED TOTAL HYSTERECTOMY WITH BILATERAL SALPINGO OOPHERECTOMY N/A 05/01/2020   Procedure: XI ROBOTIC ASSISTED TOTAL HYSTERECTOMY WITH  BILATERAL SALPINGO OOPHORECTOMY AND MINI LAPAROTOMY;  Surgeon: [name], MD;  Location: [facility];  Service: Gynecology;  Laterality: N/A;   SENTINEL NODE BIOPSY N/A 05/01/2020   Procedure: SENTINEL NODE BIOPSY;  Surgeon: [name], MD;  Location: [facility];  Service: Gynecology;  Laterality: N/A;   TUBAL LIGATION      Family History  Problem Relation Age of Onset   Hypertension Mother    Hypertension Father    Diabetes type II Sister    Hypertension Sister    Breast cancer Maternal Aunt        dx 71s   Hypertension Daughter    Breast cancer Cousin        maternal cousin; dx 38s   Breast cancer Cousin        dx early 73s   Colon cancer Neg Hx    Ovarian cancer Neg Hx    Endometrial cancer Neg Hx    Pancreatic cancer Neg Hx    Prostate cancer Neg Hx     Social History   Socioeconomic History   Marital status: Married    Spouse name: Not on file   Number of children: 2   Years of education: Not on file   Highest education level: Not on file  Occupational History   Occupation: packer/driver    Comment: [employer]  Tobacco  Use   Smoking status: Former    Packs/day: 0.20    Years: 25.00    Total pack years: 5.00    Types: Cigarettes    Quit date: 09/29/2016    Years since quitting: 5.4   Smokeless tobacco: Never  Vaping Use   Vaping Use: Never used  Substance and Sexual Activity   Alcohol use: Yes    Comment: occasional   Drug use: Yes    Types: Marijuana    Comment: occ    Sexual activity: Yes  Other Topics Concern   Not on file  Social History Narrative   Tobacco use, amount per day now: Quit 4 years ago.   Past tobacco use, amount per day: 1 pack per day.   How many years did you use tobacco: Age [age]   Alcohol use (drinks per week): Special Occassions   Diet: No   Do you drink/eat things with caffeine: Occasional Soda.   Marital status: Married                                 What year were you married? 2020   Do you live in a  house, apartment, assisted living, condo, trailer, etc.? House   Is it one or more stories? 3 stories.   How many persons live in your home? 3   Do you have pets in your home?( please list) Yes, 2 Dogs.    Highest Level of education completed? 12th Grade   Current or past profession: Mover/Packer   Do you exercise? Yes                                 Type and how often? Walking, Cardio, and Weights x 3 days a week.   Do you have a living will? No   Do you have a DNR form?   No                                If not, do you want to discuss one?   Do you have signed POA/HPOA forms? No                       If so, please bring to you appointment      Do you have any difficulty bathing or dressing yourself? No.   Do you have any difficulty preparing food or eating? No.   Do you have any difficulty managing your medications? No.   Do you have any difficulty managing your finances? No.   Do you have any difficulty affording your medications? No.   Social Determinants of Health   Financial Resource Strain: Not on file  Food Insecurity: Not on file  Transportation Needs: Not on file  Physical Activity: Not on file  Stress: Not on file  Social Connections: Not on file    Current Medications:  Current Outpatient Medications:    amLODipine (NORVASC) 10 MG tablet, TAKE ONE TABLET BY MOUTH DAILY, Disp: 90 tablet, Rfl: 1   calcium carbonate (TUMS - DOSED IN MG ELEMENTAL CALCIUM) 500 MG chewable tablet, Chew 1 tablet by mouth as needed for indigestion., Disp: , Rfl:    fluticasone (FLONASE) 50 MCG/ACT nasal spray, Place 1 spray into both nostrils daily as needed for allergies or rhinitis.,  Disp: , Rfl:    Meclizine HCl 25 MG CHEW, CHEW ONE TABLET BY MOUTH THREE TIMES A DAY AS NEEDED, Disp: 20 tablet, Rfl: 1   Multiple Vitamin (MULTIVITAMIN) tablet, Take 1 tablet by mouth daily., Disp: , Rfl:    omeprazole (PRILOSEC) 20 MG capsule, Take 20 mg by mouth daily as needed., Disp: , Rfl:    phentermine  15 MG capsule, Take 1 capsule (15 mg total) by mouth every morning., Disp: 30 capsule, Rfl: 2   VITAMIN D PO, Take 5,000 Units by mouth daily., Disp: , Rfl:   Review of Systems: Denies appetite changes, fevers, chills, fatigue, unexplained weight changes. Denies hearing loss, neck lumps or masses, mouth sores, ringing in ears or voice changes. Denies cough or wheezing.  Denies shortness of breath. Denies chest pain or palpitations. Denies leg swelling. Denies abdominal distention, pain, blood in stools, constipation, diarrhea, nausea, vomiting, or early satiety. Denies pain with intercourse, dysuria, frequency, hematuria or incontinence. Denies hot flashes, pelvic pain, vaginal bleeding or vaginal discharge.   Denies joint pain, back pain or muscle pain/cramps. Denies itching, rash, or wounds. Denies dizziness, headaches, numbness or seizures. Denies swollen lymph nodes or glands, denies easy bruising or bleeding. Denies anxiety, depression, confusion, or decreased concentration.  Physical Exam: BP (!) 145/92 (BP Location: Left Arm, Patient Position: Sitting)   Pulse 66   Temp 98.4 F (36.9 C) (Oral)   Resp 16   Ht 5' 2"  (1.575 m)   Wt 166 lb 9.6 oz (75.6 kg)   LMP 10/27/2016   SpO2 100%   BMI 30.47 kg/m  General: Alert, oriented, no acute distress. HEENT: Normocephalic, atraumatic, sclera anicteric. Chest: Clear to auscultation bilaterally.  No wheezes or rhonchi. Cardiovascular: Regular rate and rhythm, no murmurs. Abdomen: soft, nontender.  Normoactive bowel sounds.  No masses or hepatosplenomegaly appreciated.  Well-healed incisions. Extremities: Grossly normal range of motion.  Warm, well perfused.  No edema bilaterally. Skin: No rashes or lesions noted. Lymphatics: No cervical, supraclavicular, or inguinal adenopathy. GU: Normal appearing external genitalia without erythema, excoriation, or lesions.  Speculum exam reveals mildly atrophic vaginal mucosa, radiation changes  present.  No bleeding or discharge, no lesions.  Bimanual exam reveals cuff intact, no nodularity or masses.  Rectovaginal exam confirms these findings.  Laboratory & Radiologic Studies: None new  Assessment & Plan: AZA DANTES is a 51 y.o. 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 On NRG-GY020.  Completed adjuvant vaginal brachytherapy in November 2021.   Patient is doing well from a cancer standpoint and is NED on exam today.   Per study protocol, will continue with every 3 month surveillance visits for 3 years and then transition to visits every 6 months.  Reviewed signs and symptoms that should prompt a phone call between visits.   Patient has been working on weight loss, was put on phentermine.  Congratulated on her weight loss since I last saw her.  22 minutes of total time was spent for this patient encounter, including preparation, face-to-face counseling with the patient and coordination of care, and documentation of the encounter.  Jeral Pinch, MD  Division of Gynecologic Oncology  Department of Obstetrics and Gynecology  [facility]

## 2022-05-26 ENCOUNTER — Other Ambulatory Visit: Payer: Self-pay | Admitting: *Deleted

## 2022-05-26 DIAGNOSIS — C541 Malignant neoplasm of endometrium: Secondary | ICD-10-CM

## 2022-05-31 ENCOUNTER — Telehealth: Payer: Self-pay | Admitting: *Deleted

## 2022-05-31 NOTE — Progress Notes (Signed)
Radiation Oncology         [phone]) [phone] ________________________________  Name: Christy Waller MRN: [id number]  Date: 06/01/2022  DOB: 08-13-1971  Follow-Up Visit Note  NRG-GY020 Protocol   CC: Wardell Honour, MD  [place], [facility]*    ICD-10-CM   1. Endometrial cancer ([place]) [C54.1]  C54.1       Diagnosis: Stage IB (pT1b, pN0) grade 2 endometrioid carcinoma with extensive LVSI   Interval Since Last Radiation: 1 year, 10 months, and 25 days   Radiation Treatment Dates: 06/23/2020 through 07/07/2020 Site Technique Total Dose (Gy) Dose per Fx (Gy) Completed Fx Beam Energies  Vagina: Pelvis HDR-brachy 30/30 6 5/5 Ir-192    Narrative:  The patient returns today for routine follow-up, she was last seen here for follow-up on 11/24/21. Since her last visit,  the patient followed up with Dr. Berline Lopes on 02/22/22. During which time, the patient denied any symptoms concerning for disease recurrence and was noted as NED on examination.     Pertinent imaging performed in the interval includes a follow-up CT of the abdomen and pelvis on 12/22/21 which showed no definite evidence of recurrent or metastatic disease within the abdomen or pelvis. CT also showed stability in size of the 13 mm hyperenhancing segment VII hepatic lesion, which likely reflects a benign hepatic hemangioma.      On evaluation today the patient denies any pelvic pain abdominal bloating vaginal bleeding or discharge.  She denies any hematuria or rectal bleeding.                         Allergies:  is allergic to lisinopril.  Meds: Current Outpatient Medications  Medication Sig Dispense Refill   amLODipine (NORVASC) 10 MG tablet TAKE ONE TABLET BY MOUTH DAILY 90 tablet 1   calcium carbonate (TUMS - DOSED IN MG ELEMENTAL CALCIUM) 500 MG chewable tablet Chew 1 tablet by mouth as needed for indigestion.     fluticasone (FLONASE) 50 MCG/ACT nasal spray Place 1 spray into both nostrils daily as needed for allergies  or rhinitis.     Meclizine HCl 25 MG CHEW CHEW ONE TABLET BY MOUTH THREE TIMES A DAY AS NEEDED 20 tablet 1   Multiple Vitamin (MULTIVITAMIN) tablet Take 1 tablet by mouth daily.     omeprazole (PRILOSEC) 20 MG capsule Take 20 mg by mouth daily as needed.     VITAMIN D PO Take 5,000 Units by mouth daily.     No current facility-administered medications for this encounter.    Physical Findings: The patient is in no acute distress. Patient is alert and oriented.  She is accompanied by her husband on evaluation today.  height is '5\' 2"'$  (1.575 m) and weight is 164 lb (74.4 kg). Her temperature is 97.7 F (36.5 C). Her blood pressure is 148/99 (abnormal) and her pulse is 51 (abnormal). Her respiration is 20 and oxygen saturation is 100%. . Lungs are clear to auscultation bilaterally. Heart has regular rate and rhythm. No palpable cervical, supraclavicular, or axillary adenopathy. Abdomen soft, non-tender, normal bowel sounds.  On pelvic examination the external genitalia were unremarkable. A speculum exam was performed. There are no mucosal lesions noted in the vaginal vault.  On bimanual and rectovaginal examination there were no pelvic masses appreciated.  Vaginal cuff intact.  Rectal sphincter tone normal.  ECOG=0   Lab Findings: Lab Results  Component Value Date   WBC 7.0 01/20/2022   HGB 13.3 01/20/2022  HCT 41.8 01/20/2022   MCV 87.8 01/20/2022   PLT 276 01/20/2022    Radiographic Findings: No results found.  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  The patient does not appear to be exhibiting any long-term effects from her surgery and vaginal brachytherapy.  Plan: She will follow-up with Dr. Berline Lopes in 3 months.  Routine follow-up in radiation oncology in 6 months.  Blood work and scans per NRG protocol.   23 minutes of total time was spent for this patient encounter, including  preparation, face-to-face counseling with the patient and coordination of care, physical exam, and documentation of the encounter. ____________________________________  Blair Promise, PhD, MD  This document serves as a record of services personally performed by Gery Pray, MD. It was created on his behalf by Roney Mans, a trained medical scribe. The creation of this record is based on the scribe's personal observations and the provider's statements to them. This document has been checked and approved by the attending provider.

## 2022-06-01 ENCOUNTER — Encounter: Payer: Self-pay | Admitting: *Deleted

## 2022-06-01 ENCOUNTER — Inpatient Hospital Stay: Payer: Self-pay | Attending: Radiation Oncology

## 2022-06-01 ENCOUNTER — Ambulatory Visit
Admission: RE | Admit: 2022-06-01 | Discharge: 2022-06-01 | Disposition: A | Discharge status: Home / Self Care | Payer: Self-pay | Source: Ambulatory Visit | Attending: Radiation Oncology | Admitting: Radiation Oncology

## 2022-06-01 ENCOUNTER — Encounter: Payer: Self-pay | Admitting: Radiation Oncology

## 2022-06-01 VITALS — BP 148/99 | HR 51 | Temp 97.7°F | Resp 20 | Ht 62.0 in | Wt 164.0 lb

## 2022-06-01 DIAGNOSIS — C541 Malignant neoplasm of endometrium: Secondary | ICD-10-CM

## 2022-06-01 DIAGNOSIS — Z79899 Other long term (current) drug therapy: Secondary | ICD-10-CM | POA: Insufficient documentation

## 2022-06-01 DIAGNOSIS — Z006 Encounter for examination for normal comparison and control in clinical research program: Secondary | ICD-10-CM | POA: Insufficient documentation

## 2022-06-01 DIAGNOSIS — Z8542 Personal history of malignant neoplasm of other parts of uterus: Secondary | ICD-10-CM | POA: Insufficient documentation

## 2022-06-01 DIAGNOSIS — Z923 Personal history of irradiation: Secondary | ICD-10-CM | POA: Insufficient documentation

## 2022-06-01 LAB — BUN & CREATININE (CHCC)
BUN: 14 mg/dL (ref 6–20)
Creatinine: 0.75 mg/dL (ref 0.44–1.00)
GFR, Estimated: >60 mL/min (ref >60)

## 2022-06-01 NOTE — Research (Unsigned)
NRG-GY020 Arm 1, Follow-Up Visit 4 Patient comes into clinic today accompanied by husband for follow up visit 4 since completing one year on the study.    H&P Completed by Dr. Sondra Come today. See his encounter note for details.   Adverse Events No SAEs and no hospitalizations since last study visit.    Plan  H & P with clinical exam will continue to be scheduled every 3 months alternating between Dr. Sondra Come and Dr. Berline Lopes.  Next visit with Dr. Berline Lopes is due in 3 months and will be scheduled for the end of January 2024. Patient will see Dr. Sondra Come again around May 7th 2024. CT scans continue every 6 months according to protocol and do not currently line up with MD visit dates. Next CT is due 06/21/22 and is ordered today.  Patient understands to expect a call from radiology scheduler to get this scheduled. Patient has lab appointment to have BUN/Creatinine drawn in preparation for the CT scan.  (Research nurse is scheduling next 2 MD visits 1-2 weeks out past target date to eventually get the MD visits in sync with the required CT scans.  This will allow the CT to be done prior to MD visit so MD can review scan during patient's clinic visit instead of scan being done 3 weeks later when patient does not have a clinic visit).   Thanked patient for her ongoing participation in this clinical trial. Encouraged patient to contact research nurse if any questions or concerns prior to next contact. Patient verbalized understanding.   Foye Spurling, BSN, RN Clinical Research Nurse 06/01/2022

## 2022-06-01 NOTE — Progress Notes (Signed)
NELIAH CUYLER is here today for follow up post radiation to the pelvic.  They completed their radiation on: 07/07/20   Does the patient complain of any of the following:  Pain: No Abdominal bloating: No Diarrhea/Constipation: No Nausea/Vomiting: No Vaginal Discharge: No Blood in Urine or Stool: No Urinary Issues (dysuria/incomplete emptying/ incontinence/ increased frequency/urgency): No Does patient report using vaginal dilator 2-3 times a week and/or sexually active 2-3 weeks: Yes Post radiation skin changes: No   Additional comments if applicable:    BP (!) 826/41 (BP Location: Right Arm, Patient Position: Sitting, Cuff Size: Large)   Pulse (!) 51   Temp 97.7 F (36.5 C)   Resp 20   Ht '5\' 2"'$  (1.575 m)   Wt 164 lb (74.4 kg)   LMP 10/27/2016   SpO2 100%   BMI 30.00 kg/m

## 2022-06-02 NOTE — Telephone Encounter (Signed)
Unable to leave message to confirm appointment as VM is not set up.

## 2022-06-17 IMAGING — CT CT CHEST-ABD-PELV W/ CM
2 of 5 series · 13 of 36 positions shown, 15 images · IV contrast (OMNIPAQUE)
Comparison: Chest CT 03/22/2012 and pelvic ultrasound 03/19/2013

CLINICAL DATA: Staging of endometrial adenocarcinoma.

EXAM:
CT CHEST, ABDOMEN, AND PELVIS WITH CONTRAST
TECHNIQUE: Multidetector CT imaging of the chest, abdomen and pelvis was
performed following the standard protocol during bolus
administration of intravenous contrast.
CONTRAST:  100mL OMNIPAQUE IOHEXOL 300 MG/ML  SOLN

[Series 2: cap with · axial · 0.68mm/px · z∈[+1174,+1639]mm · 10 of 115 slices shown, 12 images]
[im 11/115  mediastinal]
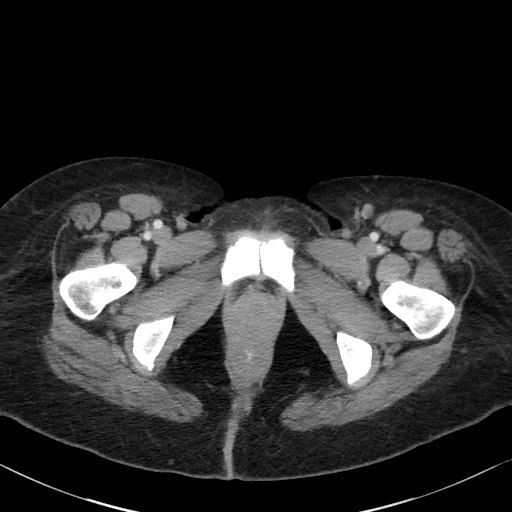
[im 11/115  bone]
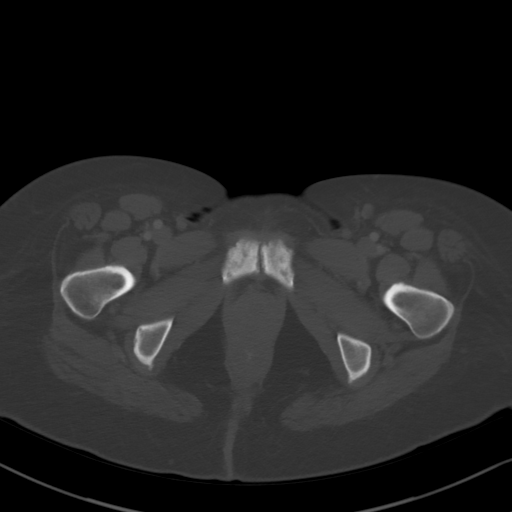
[im 21/115  mediastinal]
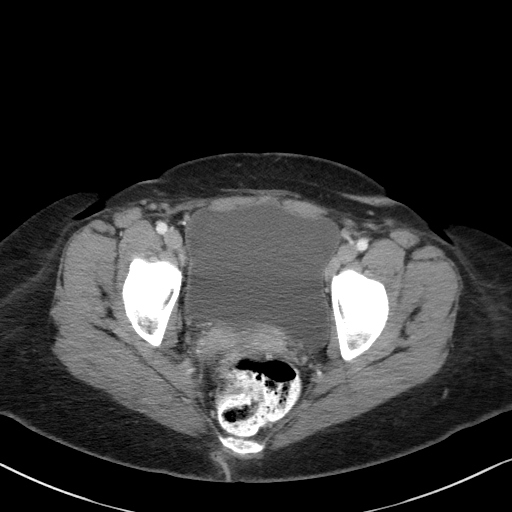
[im 32/115  mediastinal]
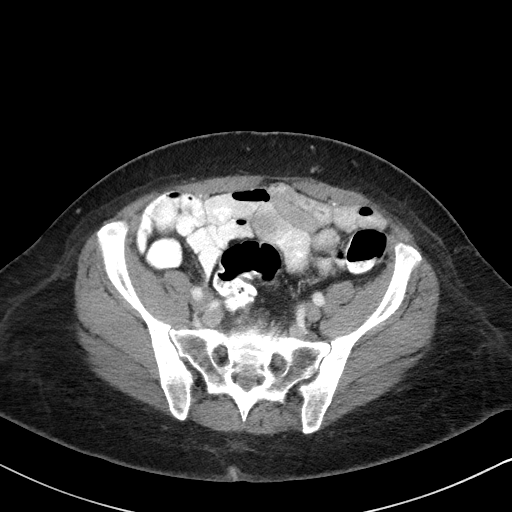
[im 42/115  mediastinal]
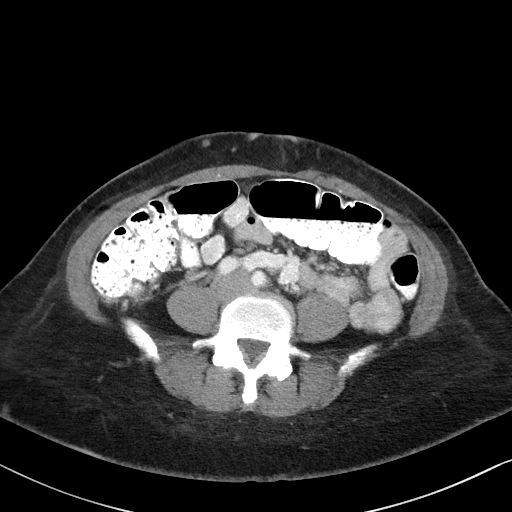
[im 52/115  mediastinal]
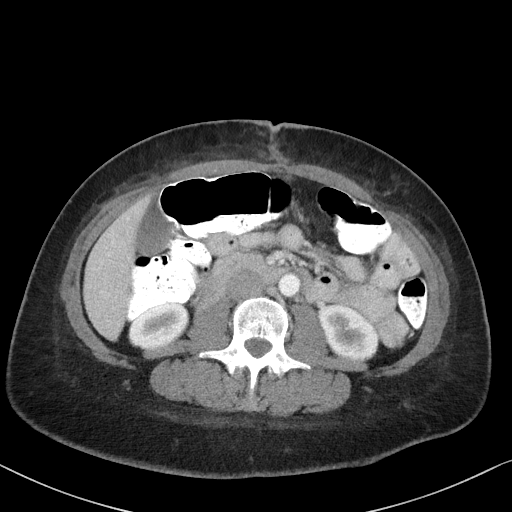
[im 63/115  mediastinal]
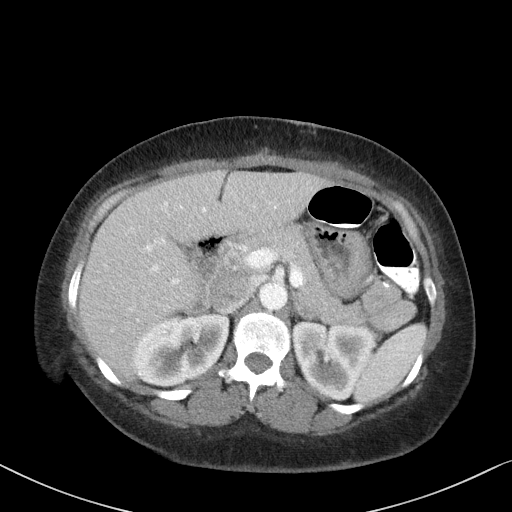
[im 73/115  mediastinal]
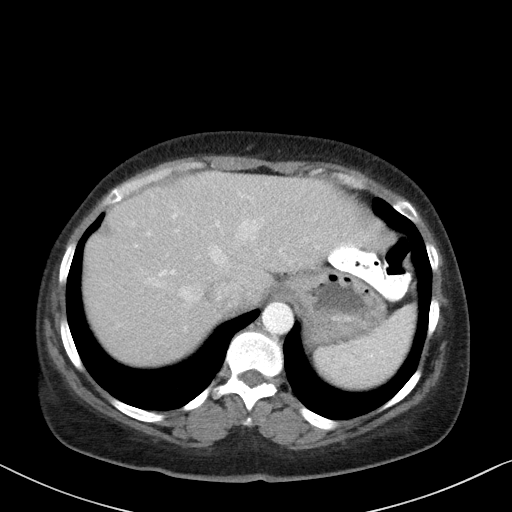
[im 83/115  mediastinal]
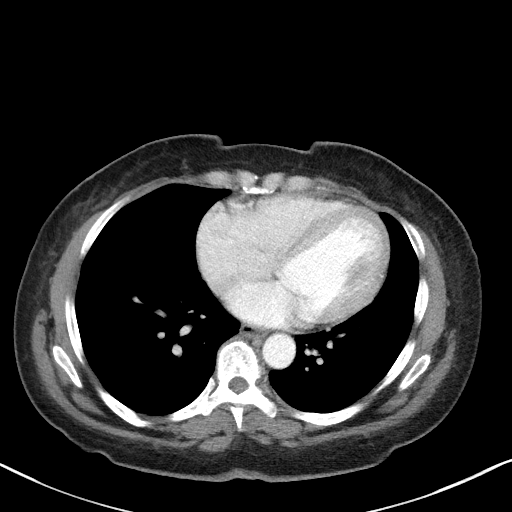
[im 94/115  mediastinal]
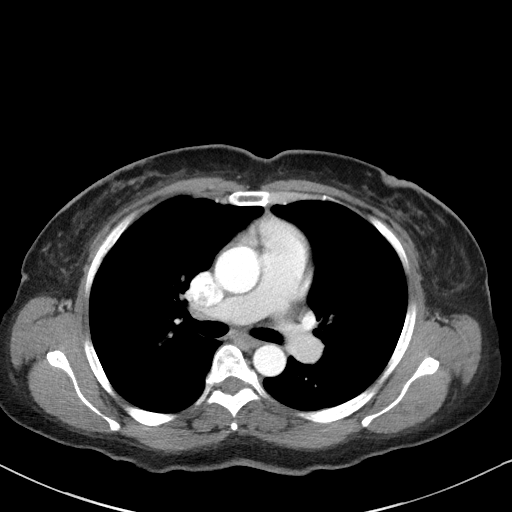
[im 94/115  bone]
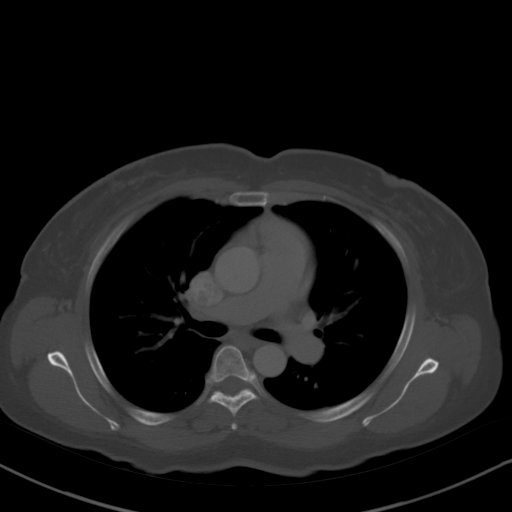
[im 104/115  mediastinal]
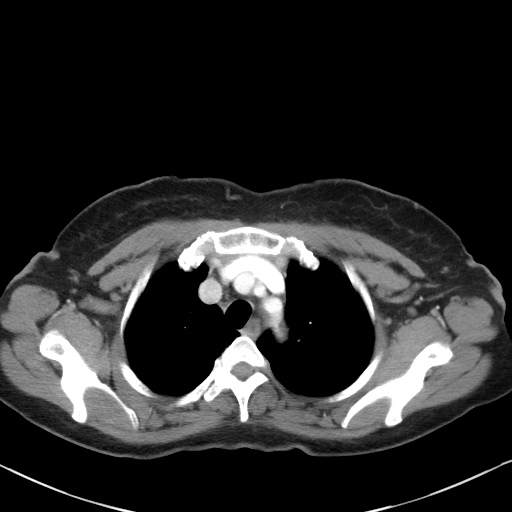

[Series 4: coronals · coronal · 0.82mm/px · 3 of 108 slices shown]
[im 22/108  mediastinal]
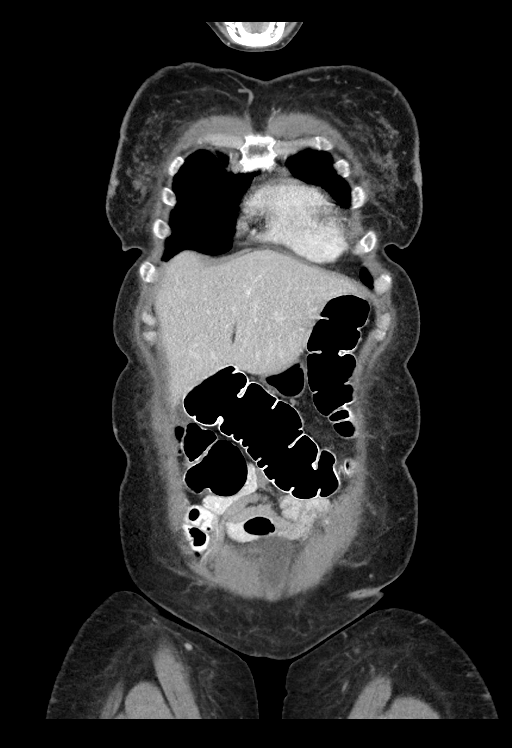
[im 43/108  mediastinal]
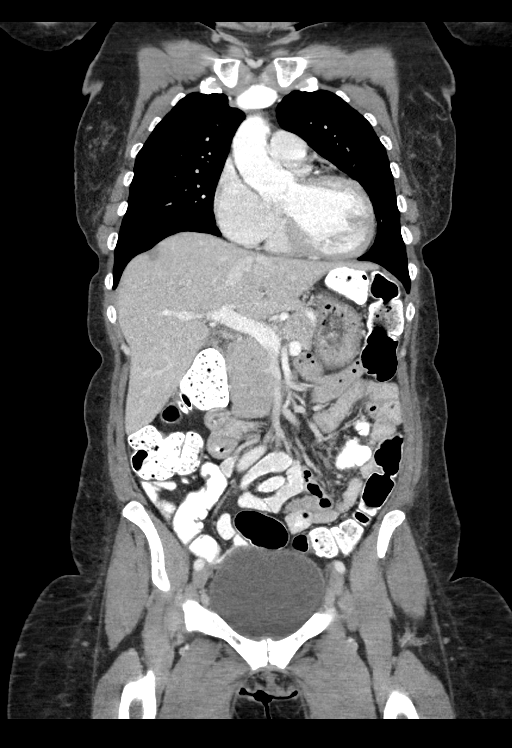
[im 65/108  mediastinal]
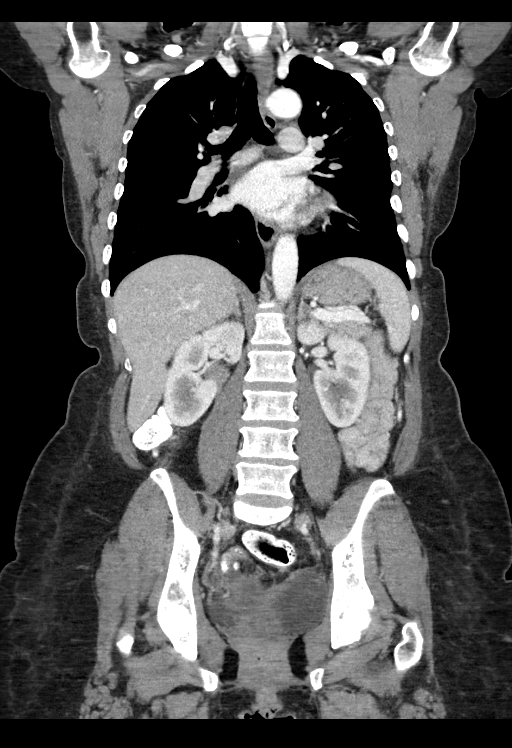

[13 of 36 positions shown; findings below may reference images not displayed]

FINDINGS: CT CHEST FINDINGS

Cardiovascular: Mild cardiomegaly.

Mediastinum/Nodes: No pathologic adenopathy identified. Small
bilateral axillary lymph nodes are not significantly enlarged.

Lungs/Pleura: No significant abnormality.

Musculoskeletal: Dextroconvex thoracic scoliosis. Nonspecific
sclerosis in the left pedicle and adjacent vertebral body at T10 on
image 36 of series 2, mildly increased from 03/22/2012. Lesser
degree of sclerosis in the left T11 pedicle.

CT ABDOMEN PELVIS FINDINGS

Hepatobiliary: Peripheral enhancement in the dome of segment 7 of
the liver on image 44 series 2 measuring 1.2 by 1.0 by 0.8 cm,
possibly a flash filling hemangioma but technically nonspecific.

0.4 cm hypodense lesion in the right hepatic lobe on image 47 of
series 2, statistically likely to be a cyst or similar benign
lesion, but technically nonspecific.

Contracted gallbladder unremarkable.  No biliary dilatation.

Pancreas: Unremarkable

Spleen: 0.7 cm hypodense lesion posteriorly in the spleen on image
48 of series 2 is too small to characterize, although statistically
likely to be benign.

Adrenals/Urinary Tract: Unremarkable

Stomach/Bowel: Unremarkable

Vascular/Lymphatic: Left external iliac lymph node 0.7 cm in short
axis. Right external iliac lymph node 0.7 cm in short axis. No
pathologically enlarged lymph nodes currently identified.

Reproductive: Uterus and ovaries surgically absent. No abnormal
nodularity along the vaginal cuff.

Other: There are few scattered small subcutaneous nodules primarily
along the upper abdominal back, for example a 0.8 by 0.6 cm
structure on image 49 series 2. These may represent small foci of
subcutaneous inflammation or small subcutaneous lymph nodes.

Musculoskeletal: Bilateral sclerosis along the sacroiliac joints,
especially the iliac side. Sclerosis of both pubic bones. Appearance
favors osteitis pubis and bilateral sacroiliitis and/or osteitis
condensans ilii. Facet arthropathy in the lumbar spine especially on
the left at L4-5.
IMPRESSION: 1. No findings of recurrent malignancy.
2. There are a few scattered small subcutaneous nodules primarily
along the upper abdominal back, probably small foci of subcutaneous
inflammation or small subcutaneous lymph nodes.
3. Peripheral enhancement in the dome of segment 7 of the liver,
possibly a flash filling hemangioma but technically nonspecific.
This could be further characterized with hepatic protocol MRI with
and without contrast if clinically warranted.
4. Sclerosis along the sacroiliac joints and pubic bones, favors
osteitis pubis and bilateral sacroiliitis and/or osteitis condensans
ilii. There is also some sclerosis in the left T10 and T11 pedicle,
mildly increased from 8697, and probably from costovertebral
arthropathy.
5. Dextroconvex thoracic scoliosis.
6. Mild cardiomegaly.
7. Facet arthropathy in the lumbar spine especially on the left at
L4-5.

## 2022-06-21 ENCOUNTER — Encounter (HOSPITAL_COMMUNITY): Payer: Self-pay

## 2022-06-21 ENCOUNTER — Ambulatory Visit (HOSPITAL_COMMUNITY)
Admission: RE | Admit: 2022-06-21 | Discharge: 2022-06-21 | Disposition: A | Discharge status: Home / Self Care | Payer: Self-pay | Source: Ambulatory Visit | Attending: Radiation Oncology | Admitting: Radiation Oncology

## 2022-06-21 DIAGNOSIS — C541 Malignant neoplasm of endometrium: Secondary | ICD-10-CM | POA: Insufficient documentation

## 2022-06-21 MED ORDER — IOHEXOL 300 MG/ML  SOLN
100.0000 mL | Freq: Once | INTRAMUSCULAR | Status: AC | PRN
  Administered 2022-06-21: 100 mL via INTRAVENOUS

## 2022-06-21 MED ORDER — SODIUM CHLORIDE (PF) 0.9 % IJ SOLN
INTRAMUSCULAR | Status: AC
  Filled 2022-06-21: qty 50

## 2022-06-22 ENCOUNTER — Telehealth: Payer: Self-pay | Admitting: *Deleted

## 2022-06-22 NOTE — Telephone Encounter (Signed)
[id number]- Contacted patient to remind her to complete study PROs on her phone app.  Patient states she has not been able to login successfully. She can't remember her password and has not been able to reset it. She says she is going to try again tonight.  Research nurse will call patient again tomorrow to see if she had any luck.  If not, then I will contact the study for assistance.  Patient verbalized understanding.  Foye Spurling, BSN, RN, [place] Nurse II [phone] 06/22/2022 11:25 AM

## 2022-06-23 ENCOUNTER — Telehealth: Payer: Self-pay | Admitting: *Deleted

## 2022-06-23 NOTE — Telephone Encounter (Signed)
[id number]; Patient was able to complete ePROs for week 104 of the study. Thanked patient for completing them on time. Informed patient of next study visit with Dr. Berline Lopes on 09/17/22 at 1:15 PM.  Patient confirmed and agreed to call if she needs to change appointment for any reason.  Foye Spurling, BSN, RN, [place] Nurse II [phone] 06/23/2022 11:57 AM

## 2022-07-19 ENCOUNTER — Other Ambulatory Visit: Payer: Self-pay | Admitting: Family Medicine

## 2022-07-19 DIAGNOSIS — I10 Essential (primary) hypertension: Secondary | ICD-10-CM

## 2022-09-16 NOTE — Progress Notes (Signed)
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 On intra-abdominal entry, normal upper abdominal survey. Normal small and large bowel. Omentum adherent to the anterior abdominal wall. Sigmoid epiploica adherent to the posterior uterus. Uterus 10-12cm, bulbous fundus. Adnexa adherent to posterior uterus, otherwise normal appearing. Mapping successful to right external and right obturator SLN, left obturator SLN, right presacral. All lymph nodes prominent.  No intra-abdominal or pelvic evidence of disease otherwise.  Some adhesions between the bladder and lower uterine segment/cervix, consistent with patient's prior C-section history.   05/01/2020 Pathology Results   A. UTERUS, CERVIX, BILATERAL TUBES AND OVARIES, RESECTION:   Uterus:  -  Endometrioid carcinoma, FIGO grade 2  -  See oncology table and comment below   Cervix:  -  No carcinoma identified   Bilateral Ovaries:  -  No carcinoma identified   B. LYMPH NODE, SENTINEL, RIGHT OBTURATOR, EXCISION:  -  No carcinoma identified in one lymph node (0/1)  -  See comment   C. LYMPH NODE, SENTINEL, RIGHT EXTERNAL ILIAC, EXCISION:  -  No carcinoma identified in one lymph node (0/1)  -  See comment   D. LYMPH NODE, SENTINEL, RIGHT PRE-SACRAL, EXCISION:  -  No carcinoma identified in one lymph node (0/1)  -  See comment   E. LYMPH NODE, SENTINEL, LEFT OBTURATOR, EXCISION:  -  No carcinoma  identified in one lymph node (0/1)  -  See comment URGICAL PATHOLOGY    05/01/2020 Cancer Staging   Staging form: Corpus Uteri - Carcinoma and Carcinosarcoma, AJCC 8th Edition - Clinical stage from 05/01/2020: FIGO Stage IB (cT1b, cN0(sn), cM0) - Signed by [name], MD on 05/08/2020   06/11/2020 Imaging   1. No findings of recurrent malignancy. 2. There are a few scattered small subcutaneous nodules primarily along the upper abdominal back, probably small foci of subcutaneous inflammation or small subcutaneous lymph nodes. 3. Peripheral enhancement in the dome of segment 7 of the liver, possibly a flash filling hemangioma but technically nonspecific. This could be further characterized with hepatic protocol MRI with and without contrast if clinically warranted. 4. 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 There is also some sclerosis in the left T10 and T11 pedicle, mildly increased from 2013, and probably from costovertebral arthropathy. 5. Dextroconvex thoracic scoliosis. 6. Mild cardiomegaly. 7. Facet arthropathy in the lumbar spine especially on the left at L4-5.   06/23/2020 - 07/07/2020 Radiation Therapy   06/23/2020 through 07/07/2020 Site Technique Total Dose (Gy) Dose per Fx (Gy) Completed Fx Beam Energies  Vagina: Pelvis HDR-brachy 30/30 6 5/5 Ir-192       07/07/2020 Genetic Testing   Negative genetic testing: no pathogenic variants detected in [facility] CancerNext-Expanded +RNAinsight Panel.  The report date is July 07, 2020.   The CancerNext-Expanded gene panel offered by [facility] and includes sequencing, rearrangement, and RNA analysis for the following 77 genes: AIP, ALK, APC, ATM, AXIN2, BAP1,  BARD1, BLM, BMPR1A, BRCA1, BRCA2, BRIP1, CDC73, CDH1, CDK4, CDKN1B, CDKN2A, CHEK2, CTNNA1, DICER1, FANCC, FH, FLCN, GALNT12, KIF1B, LZTR1, MAX, MEN1, MET, MLH1, MSH2, MSH3, MSH6,  MUTYH, NBN, NF1, NF2, NTHL1, PALB2, PHOX2B, PMS2, POT1, PRKAR1A, PTCH1, PTEN, RAD51C, RAD51D, RB1, RECQL, RET, SDHA, SDHAF2, SDHB, SDHC, SDHD, SMAD4, SMARCA4, SMARCB1, SMARCE1, STK11, SUFU, TMEM127, TP53, TSC1, TSC2, VHL and XRCC2 (sequencing and deletion/duplication); EGFR, EGLN1, HOXB13, KIT, MITF, PDGFRA, POLD1, and POLE (sequencing only); EPCAM and GREM1 (deletion/duplication only).      Interval History: Doing well.  Denies any abdominal or pelvic pain.  Denies vaginal bleeding or discharge.  Reports baseline bowel bladder function.  She is looking forward to her next grandchild, due date in June.  Past Medical/Surgical History: Past Medical History:  Diagnosis Date   Anemia 2013   Iron Deficiency Anemia   Bradycardia 03/2012   Event monitor 8/13: NSR, sinus bradycardia, sinus tachycardia, no significant bradycardia arrhythmias   Chest pain 03/22/2012   Normal Myoview, normal echo, chest CT negative for pulmonary embolism   endometrial ca 04/2020   Family history of breast cancer 06/17/2020   History of colonoscopy    History of CT scan 2021   Blair Promise, MD   History of nuclear stress test 03/2012   Myoview 03/23/12: EF 50%, no ischemia or scar.   History of radiation therapy 06/23/20-07/07/20   vaginal brachytherapy - HDR- Endometrium, Dr. Sondra Come   HTN (hypertension) 03/2012   2-D echocardiogram 03/22/12: EF 50%, mild LAE, no wall motion abnormalities.   Tubal pregnancy aprox 1998   In her late 28s   Vitamin D deficiency 2013    Past Surgical History:  Procedure Laterality Date   ABDOMINAL HYSTERECTOMY  2021   Blair Promise, MD, PhD   CESAREAN SECTION     x2   ECTOPIC PREGNANCY SURGERY     ROBOTIC ASSISTED TOTAL HYSTERECTOMY WITH BILATERAL SALPINGO OOPHERECTOMY N/A 05/01/2020   Procedure: XI ROBOTIC ASSISTED TOTAL HYSTERECTOMY WITH BILATERAL SALPINGO OOPHORECTOMY AND MINI LAPAROTOMY;  Surgeon: [name], MD;  Location: [facility];  Service: Gynecology;   Laterality: N/A;   SENTINEL NODE BIOPSY N/A 05/01/2020   Procedure: SENTINEL NODE BIOPSY;  Surgeon: [name], MD;  Location: [facility];  Service: Gynecology;  Laterality: N/A;   TUBAL LIGATION      Family History  Problem Relation Age of Onset   Hypertension Mother    Hypertension Father    Diabetes type II Sister    Hypertension Sister    Breast cancer Maternal Aunt        dx 74s   Hypertension Daughter    Breast cancer Cousin        maternal cousin; dx 26s   Breast cancer Cousin        dx early 63s   Colon cancer Neg Hx    Ovarian cancer Neg Hx    Endometrial cancer Neg Hx    Pancreatic cancer Neg Hx    Prostate cancer Neg Hx     Social History   Socioeconomic History   Marital status: Married    Spouse name: Not on file   Number of children: 2   Years of education: Not on file   Highest education level: Not on file  Occupational History   Occupation: Designer, industrial/product    Comment: [employer]  Tobacco Use   Smoking status: Former    Packs/day: 0.20    Years: 25.00    Total pack years: 5.00  Types: Cigarettes    Quit date: 09/29/2016    Years since quitting: 5.9   Smokeless tobacco: Never  Vaping Use   Vaping Use: Never used  Substance and Sexual Activity   Alcohol use: Yes    Comment: occasional   Drug use: Yes    Types: Marijuana    Comment: occ    Sexual activity: Yes  Other Topics Concern   Not on file  Social History Narrative   Tobacco use, amount per day now: Quit 4 years ago.   Past tobacco use, amount per day: 1 pack per day.   How many years did you use tobacco: Age [age]   Alcohol use (drinks per week): Special Occassions   Diet: No   Do you drink/eat things with caffeine: Occasional Soda.   Marital status: Married                                 What year were you married? 2020   Do you live in a house, apartment, assisted living, condo, trailer, etc.? House   Is it one or more stories? 3 stories.   How many persons live  in your home? 3   Do you have pets in your home?( please list) Yes, 2 Dogs.    Highest Level of education completed? 12th Grade   Current or past profession: Mover/Packer   Do you exercise? Yes                                 Type and how often? Walking, Cardio, and Weights x 3 days a week.   Do you have a living will? No   Do you have a DNR form?   No                                If not, do you want to discuss one?   Do you have signed POA/HPOA forms? No                       If so, please bring to you appointment      Do you have any difficulty bathing or dressing yourself? No.   Do you have any difficulty preparing food or eating? No.   Do you have any difficulty managing your medications? No.   Do you have any difficulty managing your finances? No.   Do you have any difficulty affording your medications? No.   Social Determinants of Health   Financial Resource Strain: Not on file  Food Insecurity: Not on file  Transportation Needs: Not on file  Physical Activity: Not on file  Stress: Not on file  Social Connections: Not on file    Current Medications:  Current Outpatient Medications:    amLODipine (NORVASC) 10 MG tablet, TAKE 1 TABLET BY MOUTH DAILY, Disp: 90 tablet, Rfl: 1   calcium carbonate (TUMS - DOSED IN MG ELEMENTAL CALCIUM) 500 MG chewable tablet, Chew 1 tablet by mouth as needed for indigestion., Disp: , Rfl:    fluticasone (FLONASE) 50 MCG/ACT nasal spray, Place 1 spray into both nostrils daily as needed for allergies or rhinitis., Disp: , Rfl:    Meclizine HCl 25 MG CHEW, CHEW ONE TABLET BY MOUTH THREE TIMES A DAY AS NEEDED, Disp: 20 tablet, Rfl:  1   Multiple Vitamin (MULTIVITAMIN) tablet, Take 1 tablet by mouth daily., Disp: , Rfl:    omeprazole (PRILOSEC) 20 MG capsule, Take 20 mg by mouth daily as needed., Disp: , Rfl:    VITAMIN D PO, Take 5,000 Units by mouth daily., Disp: , Rfl:   Review of Systems: Denies appetite changes, fevers, chills, fatigue,  unexplained weight changes. Denies hearing loss, neck lumps or masses, mouth sores, ringing in ears or voice changes. Denies cough or wheezing.  Denies shortness of breath. Denies chest pain or palpitations. Denies leg swelling. Denies abdominal distention, pain, blood in stools, constipation, diarrhea, nausea, vomiting, or early satiety. Denies pain with intercourse, dysuria, frequency, hematuria or incontinence. Denies hot flashes, pelvic pain, vaginal bleeding or vaginal discharge.   Denies joint pain, back pain or muscle pain/cramps. Denies itching, rash, or wounds. Denies dizziness, headaches, numbness or seizures. Denies swollen lymph nodes or glands, denies easy bruising or bleeding. Denies anxiety, depression, confusion, or decreased concentration.  Physical Exam: BP (!) 144/82   Pulse (!) 48   Temp 98.2 F (36.8 C)   Resp 20   Wt 167 lb 9.6 oz (76 kg)   LMP 10/27/2016   BMI 30.65 kg/m  General: Alert, oriented, no acute distress. HEENT: Normocephalic, atraumatic, sclera anicteric. Chest: Clear to auscultation bilaterally.  No wheezes or rhonchi. Cardiovascular: Regular rate and rhythm, no murmurs. Abdomen: soft, nontender.  Normoactive bowel sounds.  No masses or hepatosplenomegaly appreciated.  Well-healed incisions. Extremities: Grossly normal range of motion.  Warm, well perfused.  No edema bilaterally. Skin: No rashes or lesions noted. Lymphatics: No cervical, supraclavicular, or inguinal adenopathy. GU: Normal appearing external genitalia without erythema, excoriation, or lesions.  Speculum exam reveals mildly atrophic vaginal mucosa, radiation changes present.  No bleeding or discharge, no lesions.  Bimanual exam reveals cuff intact, no nodularity or masses.  Rectovaginal exam confirms these findings.  Laboratory & Radiologic Studies: CT C/A/P on 06/21/22: 1. Prior hysterectomy without evidence of local recurrence or metastatic disease in the chest, abdomen or  pelvis. 2. Enhancing segment VII hepatic lesion measures 17 mm previously 14 mm with the appearance on prior CT sensitive to contrast timing, favored to reflect a benign hepatic hemangioma but again incompletely evaluated on this examination, consider more definitive characterization with hepatic protocol MRI with and without contrast. 3. Similar appearance of the scattered nonspecific subcutaneous nodules in the anterior and posterior abdominal wall. 4. Moderate volume of formed stool in the proximal colon. Correlate for constipation.  Assessment & Plan: Christy Waller is a 52 y.o. 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 On [id number].  Completed adjuvant vaginal brachytherapy in November 2021.   Patient is doing well from a cancer standpoint and is NED on exam today.   Per study protocol, will continue with every 3 month surveillance visits for 3 years and then transition to visits every 6 months.  Reviewed signs and symptoms that should prompt a phone call between visits.   Weight has continued to be up and down.  Patient was given encouragement regarding continued attempts at weight loss.  20 minutes of total time was spent for this patient encounter, including preparation, face-to-face counseling with the patient and coordination of care, and documentation of the encounter.  Jeral Pinch, MD  Division of Gynecologic Oncology  Department of Obstetrics and Gynecology  [facility]

## 2022-09-17 ENCOUNTER — Encounter: Payer: Self-pay | Admitting: Gynecologic Oncology

## 2022-09-17 ENCOUNTER — Other Ambulatory Visit: Payer: Self-pay

## 2022-09-17 ENCOUNTER — Encounter: Payer: Self-pay | Admitting: *Deleted

## 2022-09-17 ENCOUNTER — Inpatient Hospital Stay: Payer: Self-pay | Attending: Radiation Oncology | Admitting: Gynecologic Oncology

## 2022-09-17 VITALS — BP 144/82 | HR 48 | Temp 98.2°F | Resp 20 | Wt 167.6 lb

## 2022-09-17 DIAGNOSIS — Z90722 Acquired absence of ovaries, bilateral: Secondary | ICD-10-CM | POA: Insufficient documentation

## 2022-09-17 DIAGNOSIS — Z923 Personal history of irradiation: Secondary | ICD-10-CM | POA: Insufficient documentation

## 2022-09-17 DIAGNOSIS — C541 Malignant neoplasm of endometrium: Secondary | ICD-10-CM

## 2022-09-17 DIAGNOSIS — Z90710 Acquired absence of both cervix and uterus: Secondary | ICD-10-CM | POA: Insufficient documentation

## 2022-09-17 DIAGNOSIS — Z8542 Personal history of malignant neoplasm of other parts of uterus: Secondary | ICD-10-CM | POA: Insufficient documentation

## 2022-09-17 NOTE — Research (Signed)
[id number] Arm 1, Follow-Up Visit 5 Patient comes into clinic today accompanied by husband for follow up visit 5.    H&P Completed by Dr. Berline Lopes today. See MD encounter note for details.   Adverse Events No SAEs and no hospitalizations since last study visit.    Plan  H & P with clinical exam will continue every 3 months alternating between Dr. Sondra Come and Dr. Berline Lopes.  Next visit with Dr. Sondra Come is scheduled in 3 months on 12/21/22. CT scans continue every 6 months according to protocol. Next scan is due in 3 months and will be scheduled the week prior to MD appointment. Patient states the morning of 5/2 works for her schedule. Patient understands to expect a call from radiology scheduler with appointment date/time. Lab appointment will be added for BUN/Creatinine prior to CT scan.    Thanked patient for her ongoing participation in this clinical trial. Encouraged patient to contact research nurse if any questions or concerns prior to next contact. Gave patient another business card with my phone number. Patient verbalized understanding.   Foye Spurling, BSN, RN, [place] Nurse II [phone] 09/17/2022

## 2022-09-17 NOTE — Patient Instructions (Signed)
It was good to see you today.  I do not see or feel any evidence of cancer recurrence on your exam.  I will see you for follow-up in 6 months.  Please call my office in May or June to have this visit scheduled as my clinic schedule is not available past the end of June at this time.  As always, if you develop any new and concerning symptoms before your next visit, please call to see me sooner.

## 2022-10-18 ENCOUNTER — Telehealth: Payer: Self-pay

## 2022-10-18 NOTE — Telephone Encounter (Signed)
Patient called requesting to restart and doasge increase on Phentermine.  Message routed to Dr. Alain Honey

## 2022-10-19 ENCOUNTER — Other Ambulatory Visit: Payer: Self-pay | Admitting: Family Medicine

## 2022-10-19 DIAGNOSIS — E669 Obesity, unspecified: Secondary | ICD-10-CM

## 2022-10-19 MED ORDER — PHENTERMINE HCL 15 MG PO CAPS: 15.0000 mg | ORAL_CAPSULE | ORAL | 2 refills | Status: DC

## 2022-10-20 NOTE — Telephone Encounter (Signed)
Rx for phenterimine sent to pharmacy

## 2022-11-08 ENCOUNTER — Telehealth: Payer: Self-pay | Admitting: *Deleted

## 2022-11-08 NOTE — Telephone Encounter (Signed)
CALLED PATIENT TO ASK QUESTION, SPOKE WITH PATIENT 

## 2022-11-08 NOTE — Telephone Encounter (Signed)
[id number] Patient scheduled for CT Chest, Abd/Pelvis on 12/16/22 at 1 pm.  Instructions given to not have anything to eat or drink after 9 am and to arrive at Radiology at [facility] at 10:45 am.  Lab appointment prior at [facility] at 10:15 am.  Dr. Sondra Come appt on 12/21/22 at 11:30 am.  Patient confirmed appointment and instructions for CT scan. Asked patient to contact research nurse if any questions or if she needs to change appointments. She verbalized understanding.  Foye Spurling, BSN, RN, [place] Nurse II [phone] 11/08/2022 10:38 AM

## 2022-12-16 ENCOUNTER — Ambulatory Visit (HOSPITAL_COMMUNITY)
Admission: RE | Admit: 2022-12-16 | Discharge: 2022-12-16 | Disposition: A | Discharge status: Home / Self Care | Payer: BLUE CROSS/BLUE SHIELD | Source: Ambulatory Visit | Attending: Radiation Oncology | Admitting: Radiation Oncology

## 2022-12-16 ENCOUNTER — Other Ambulatory Visit: Payer: Self-pay

## 2022-12-16 ENCOUNTER — Inpatient Hospital Stay: Payer: BLUE CROSS/BLUE SHIELD | Attending: Radiation Oncology

## 2022-12-16 DIAGNOSIS — C541 Malignant neoplasm of endometrium: Secondary | ICD-10-CM

## 2022-12-16 DIAGNOSIS — Z8542 Personal history of malignant neoplasm of other parts of uterus: Secondary | ICD-10-CM | POA: Diagnosis present

## 2022-12-16 LAB — BUN & CREATININE (CHCC)
BUN: 14 mg/dL (ref 6–20)
Creatinine: 0.77 mg/dL (ref 0.44–1.00)
GFR, Estimated: >60 mL/min (ref >60)

## 2022-12-16 MED ORDER — SODIUM CHLORIDE (PF) 0.9 % IJ SOLN
INTRAMUSCULAR | Status: AC
  Filled 2022-12-16: qty 50

## 2022-12-16 MED ORDER — IOHEXOL 300 MG/ML  SOLN
100.0000 mL | Freq: Once | INTRAMUSCULAR | Status: AC | PRN
  Administered 2022-12-16: 100 mL via INTRAVENOUS

## 2022-12-19 NOTE — Progress Notes (Signed)
Radiation Oncology         ([phone]) [phone] ________________________________  Name: Christy Waller MRN: [id number]  Date: 12/21/2022  DOB: Oct 25, 1970  Follow-Up Visit Note  NRG-GY020 Protocol   CC: Frederica Kuster, MD  [facility]    ICD-10-CM   1. 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 She was last seen here for follow-up on 06/01/22.   Since her last visit, the patient followed up with Dr. Pricilla Holm on 09/17/22. During which time, the patient denied any symptoms concerning for disease recurrence and she was noted as NED on examination.    Pertinent imaging performed in the interval since her last visit includes:  -- CT CAP on 06/21/22 which demonstrated no evidence of local recurrence or metastatic disease in the chest, abdomen or pelvis. CT also showed: a slight increase in size of an enhancing segment VII hepatic lesion, measuring 17 mm previously 14 mm, favored to reflect a benign hepatic hemangioma (however this was incompletely evaluated on this examination), and a similar appearance of the scattered nonspecific subcutaneous nodules in the anterior and posterior abdominal wall.    -- CT CAP on 12/16/22 showed: No evidence of recurrent or metastatic disease.  The patient was noted to have a benign enhancing lesion in the right hepatic lobe.  On evaluation today the patient denies any abdominal bloating, vaginal bleeding or discharge.  She denies any pelvic pain.  She denies any hematuria or rectal bleeding.  She denies any pain with intercourse or postcoital bleeding.  She  denies any new medical issues since her last follow-up.              Allergies:  is allergic to lisinopril.  Meds: Current Outpatient Medications  Medication Sig Dispense Refill   amLODipine (NORVASC) 10 MG tablet TAKE 1 TABLET BY MOUTH DAILY 90 tablet 1   calcium carbonate (TUMS - DOSED IN MG ELEMENTAL CALCIUM) 500 MG chewable tablet Chew 1 tablet by mouth as needed for indigestion.     fluticasone (FLONASE) 50 MCG/ACT nasal spray Place 1 spray into both nostrils daily as needed for allergies or rhinitis.     Meclizine HCl 25 MG CHEW CHEW ONE TABLET BY MOUTH THREE TIMES A DAY AS NEEDED 20 tablet 1   Multiple Vitamin (MULTIVITAMIN) tablet Take 1 tablet by mouth daily.     omeprazole (PRILOSEC) 20 MG capsule Take 20 mg by mouth daily as needed.     phentermine 15 MG capsule Take 1 capsule (15 mg total) by mouth every morning. 30 capsule 2   VITAMIN D PO Take 5,000 Units by mouth daily.     No current facility-administered medications for this encounter.    Physical Findings: The patient is in no acute distress. Patient is alert and oriented.  Accompanied by her husband on evaluation today  weight is 159 lb 9.6 oz (72.4 kg). Her temperature is 98 F (36.7 C). Her blood pressure is 145/90 (abnormal) and her pulse is 68. Her respiration is 20 and oxygen saturation is 100%. .  Lungs are clear to  auscultation bilaterally. Heart has regular rate and rhythm. No palpable cervical, supraclavicular, or axillary adenopathy. Abdomen soft, non-tender, normal bowel sounds.  On pelvic examination the external genitalia were unremarkable. A speculum exam was performed. There are no mucosal lesions noted in the vaginal vault.  Mild radiation changes noted at the vaginal cuff.  On bimanual and rectovaginal examination no pelvic masses appreciated.  Vaginal cuff intact.  Rectal sphincter tone normal.  ECOG=0   Lab Findings: Lab Results  Component Value Date   WBC 7.0 01/20/2022   HGB 13.3 01/20/2022    HCT 41.8 01/20/2022   MCV 87.8 01/20/2022   PLT 276 01/20/2022    Radiographic Findings: CT Abdomen Pelvis W Contrast  Result Date: 12/20/2022 CLINICAL DATA:  Follow-up endometrial adenocarcinoma, status post surgery and radiation EXAM: CT CHEST, ABDOMEN, AND PELVIS WITH CONTRAST TECHNIQUE: Multidetector CT imaging of the chest, abdomen and pelvis was performed following the standard protocol during bolus administration of intravenous contrast. RADIATION DOSE REDUCTION: This exam was performed according to the departmental dose-optimization program which includes automated exposure control, adjustment of the mA and/or kV according to patient size and/or use of iterative reconstruction technique. CONTRAST:  OMNIPAQUE IOHEXOL 300 MG/ML  SOLN COMPARISON:  06/21/2022 and 06/10/2020 FINDINGS: CT CHEST FINDINGS Cardiovascular: The heart is normal in size. No pericardial effusion. No evidence of thoracic aortic aneurysm. Mediastinum/Nodes: No suspicious mediastinal lymphadenopathy. Small left axillary nodes measuring up to 8 mm short axis with preservation of the normal fatty hilum, within normal limits. Visualized thyroid is unremarkable. Lungs/Pleura: No suspicious pulmonary nodules. No focal consolidation. No pleural effusion or pneumothorax. Musculoskeletal: Mild thoracic dextroscoliosis. CT ABDOMEN PELVIS FINDINGS Hepatobiliary: 18 mm enhancing lesion in the posterior right hepatic lobe (series 2/image 40), previously 17 mm. This measured 15 mm in 2021. This favors a benign hypervascular lesion such as FNH or less likely hemangioma. However, given slow growth, definitive characterization via MR could be performed if clinically warranted. Stable 4-5 mm probable cysts in segment 4 and (2 series 2/image 42), benign. Gallbladder is unremarkable. No intrahepatic or extrahepatic dilatation. Pancreas: Within normal limits. Spleen: Low-density posterior splenic lesions (series 2/image 45), unchanged, benign.  Adrenals/Urinary Tract: Adrenal glands are within normal limits. Kidneys are within normal limits.  No hydronephrosis. Bladder is within normal limits. Stomach/Bowel: Stomach is within normal limits. No evidence of bowel obstruction. Normal appendix (series 2/image 66). No colonic wall thickening or inflammatory changes. Vascular/Lymphatic: No evidence of abdominal aortic aneurysm. No suspicious abdominopelvic lymphadenopathy. Reproductive: Status post hysterectomy. No adnexal masses. Other: No abdominopelvic ascites. No peritoneal nodularity or omental caking. Musculoskeletal: Visualized osseous structures are within normal limits. IMPRESSION: Status post hysterectomy. 18 mm enhancing lesion in the posterior right hepatic lobe, previously 15 mm in 2021, favoring a benign FNH or hemangioma. Definitive characterization via MR can be performed as clinically warranted. No evidence of recurrent or metastatic disease. Electronically Signed   By: Charline Bills M.D.   On: 12/20/2022 20:35   CT Chest W Contrast  Result Date: 12/20/2022 CLINICAL DATA:  Follow-up endometrial adenocarcinoma, status post surgery and radiation EXAM: CT CHEST, ABDOMEN, AND PELVIS WITH CONTRAST TECHNIQUE: Multidetector CT imaging of the chest, abdomen and pelvis was performed following the standard protocol during bolus administration of intravenous contrast. RADIATION DOSE REDUCTION: This exam was performed according to the departmental dose-optimization program which includes automated exposure control, adjustment of the mA and/or kV according to patient size and/or use of iterative reconstruction technique. CONTRAST:  OMNIPAQUE IOHEXOL 300 MG/ML  SOLN COMPARISON:  06/21/2022 and 06/10/2020 FINDINGS: CT CHEST FINDINGS Cardiovascular: The heart is normal in size. No pericardial effusion. No evidence of thoracic aortic aneurysm. Mediastinum/Nodes: No suspicious mediastinal lymphadenopathy. Small left axillary nodes measuring up to 8  mm short axis with preservation of the normal fatty hilum, within normal limits. Visualized thyroid is unremarkable. Lungs/Pleura: No suspicious pulmonary nodules. No focal consolidation. No pleural effusion or pneumothorax. Musculoskeletal: Mild thoracic dextroscoliosis. CT ABDOMEN PELVIS FINDINGS Hepatobiliary: 18 mm enhancing lesion in the posterior right hepatic lobe (series 2/image 40), previously 17 mm. This measured 15 mm in 2021. This favors a benign hypervascular lesion such as FNH or less likely hemangioma. However, given slow growth, definitive characterization via MR could be performed if clinically warranted. Stable 4-5 mm probable cysts in segment 4 and (2 series 2/image 42), benign. Gallbladder is unremarkable. No intrahepatic or extrahepatic dilatation. Pancreas: Within normal limits. Spleen: Low-density posterior splenic lesions (series 2/image 45), unchanged, benign. Adrenals/Urinary Tract: Adrenal glands are within normal limits. Kidneys are within normal limits.  No hydronephrosis. Bladder is within normal limits. Stomach/Bowel: Stomach is within normal limits. No evidence of bowel obstruction. Normal appendix (series 2/image 66). No colonic wall thickening or inflammatory changes. Vascular/Lymphatic: No evidence of abdominal aortic aneurysm. No suspicious abdominopelvic lymphadenopathy. Reproductive: Status post hysterectomy. No adnexal masses. Other: No abdominopelvic ascites. No peritoneal nodularity or omental caking. Musculoskeletal: Visualized osseous structures are within normal limits. IMPRESSION: Status post hysterectomy. 18 mm enhancing lesion in the posterior right hepatic lobe, previously 15 mm in 2021, favoring a benign FNH or hemangioma. Definitive characterization via MR can be performed as clinically warranted. No evidence of recurrent or metastatic disease. Electronically Signed   By: Charline Bills M.D.   On: 12/20/2022 20:35    Impression: Stage IB (pT1b, pN0) grade 2  endometrioid carcinoma with extensive LVSI    No evidence of recurrence on clinical exam today.  She does not appear to be exhibiting any long-term effects from her surgery or vaginal brachytherapy.  Plan: She will follow-up with Dr. Pricilla Holm in 3 months.  Patient will then follow-up in radiation oncology in early November.  At that point the patient  can transition to 52-month interval follow-up.  Blood work and scans per NRG protocol.   25 minutes of total time was spent for this patient encounter, including preparation, face-to-face counseling with the patient and coordination of care, physical exam, and documentation of the encounter. ____________________________________  Billie Lade, PhD, MD  This document serves as a record of services personally performed by Antony Blackbird, MD. It was created on his behalf by Neena Rhymes, a trained medical scribe. The creation of this record is based on the scribe's personal observations and the provider's statements to them. This document has been checked and approved by the attending provider.

## 2022-12-20 ENCOUNTER — Ambulatory Visit: Payer: Self-pay | Admitting: Radiation Oncology

## 2022-12-21 ENCOUNTER — Encounter: Payer: Self-pay | Admitting: *Deleted

## 2022-12-21 ENCOUNTER — Ambulatory Visit
Admission: RE | Admit: 2022-12-21 | Discharge: 2022-12-21 | Disposition: A | Discharge status: Home / Self Care | Payer: Self-pay | Source: Ambulatory Visit | Attending: Radiation Oncology | Admitting: Radiation Oncology

## 2022-12-21 ENCOUNTER — Encounter: Payer: Self-pay | Admitting: Radiation Oncology

## 2022-12-21 VITALS — BP 145/90 | HR 68 | Temp 98.0°F | Resp 20 | Wt 159.6 lb

## 2022-12-21 DIAGNOSIS — Z923 Personal history of irradiation: Secondary | ICD-10-CM | POA: Insufficient documentation

## 2022-12-21 DIAGNOSIS — C541 Malignant neoplasm of endometrium: Secondary | ICD-10-CM

## 2022-12-21 DIAGNOSIS — Z79899 Other long term (current) drug therapy: Secondary | ICD-10-CM | POA: Insufficient documentation

## 2022-12-21 DIAGNOSIS — Z8542 Personal history of malignant neoplasm of other parts of uterus: Secondary | ICD-10-CM | POA: Insufficient documentation

## 2022-12-21 NOTE — Progress Notes (Signed)
Christy Waller is here today for follow up post radiation to the pelvic.  They completed their radiation on: 07/07/20  Does the patient complain of any of the following:  Pain:No Abdominal bloating: No Diarrhea/Constipation: No Nausea/Vomiting: No Vaginal Discharge: No Blood in Urine or Stool: No Urinary Issues (dysuria/incomplete emptying/ incontinence/ increased frequency/urgency): No Does patient report using vaginal dilator 2-3 times a week and/or sexually active 2-3 weeks: Yes Post radiation skin changes: No   Additional comments if applicable:    BP (!) 145/90   Pulse 68   Temp 98 F (36.7 C)   Resp 20   Wt 159 lb 9.6 oz (72.4 kg)   LMP 10/27/2016   SpO2 100%   BMI 29.19 kg/m

## 2022-12-21 NOTE — Research (Signed)
NRG-GY020 Arm 1, Follow-Up Visit 6 Patient comes into clinic today accompanied by husband for follow up visit 6.    H&P Completed by Dr. Roselind Messier today. See MD encounter note for details.   Adverse Events No SAEs or hospitalizations since last study visit.    Plan  H & P with clinical exam will continue every 3 months alternating between Dr. Roselind Messier and Dr. Pricilla Holm.  Next visit with Dr. Pricilla Holm will be requested to be scheduled in 3 months.    Thanked patient for her ongoing participation in this clinical trial. Encouraged patient to contact research nurse if any questions or concerns prior to next contact. Patient verbalized understanding.   Domenica Reamer, BSN, RN, [employer] Research Nurse II [phone] 12/21/2022

## 2022-12-28 ENCOUNTER — Telehealth: Payer: Self-pay | Admitting: *Deleted

## 2022-12-28 NOTE — Telephone Encounter (Signed)
Notified patient of next visit with Dr. Pricilla Holm on 03/31/23 at 2 pm and to arrive 20 minutes early to check in. Asked patient to contact research nurse if she needs to reschedule. Patient verbalized understanding.  Domenica Reamer, BSN, RN, [employer] Research Nurse II [phone] 12/28/2022

## 2023-01-19 ENCOUNTER — Other Ambulatory Visit: Payer: Self-pay | Admitting: Family Medicine

## 2023-01-19 DIAGNOSIS — I10 Essential (primary) hypertension: Secondary | ICD-10-CM

## 2023-01-26 ENCOUNTER — Other Ambulatory Visit: Payer: Self-pay | Admitting: Family

## 2023-01-26 ENCOUNTER — Ambulatory Visit (INDEPENDENT_AMBULATORY_CARE_PROVIDER_SITE_OTHER): Payer: BLUE CROSS/BLUE SHIELD | Admitting: Family Medicine

## 2023-01-26 ENCOUNTER — Encounter: Payer: Self-pay | Admitting: Family Medicine

## 2023-01-26 VITALS — BP 138/86 | HR 74 | Temp 97.4°F | Ht 62.0 in | Wt 157.6 lb

## 2023-01-26 DIAGNOSIS — I10 Essential (primary) hypertension: Secondary | ICD-10-CM | POA: Diagnosis not present

## 2023-01-26 DIAGNOSIS — E669 Obesity, unspecified: Secondary | ICD-10-CM

## 2023-01-26 NOTE — Progress Notes (Signed)
Provider:  Jacalyn Lefevre, MD  Careteam: Patient Care Team: Frederica Kuster, MD as PCP - General (Family Medicine) Default, Provider, MD as Technician  PLACE OF SERVICE:  [facility]  Advanced Directive information    Allergies  Allergen Reactions   Lisinopril Swelling    Angioedema    Chief Complaint  Patient presents with   Medical Management of Chronic Issues    Patient presents for a year follow-up     HPI: Patient is a 52 y.o. female she was last seen 1 year ago with a desire to lose weight.  She was given phentermine 15 mg that she has been using over the last year and has lost about 14 pounds.  She desires to continue weight loss in preparation for going on a birthday anniversary cruise for a cousin.  She denies any problems with the medicine such as insomnia palpitations or other.  Review of Systems:  Review of Systems  Constitutional: Negative.   Respiratory: Negative.    Cardiovascular: Negative.   Musculoskeletal: Negative.   Neurological: Negative.   Psychiatric/Behavioral: Negative.    All other systems reviewed and are negative.   Past Medical History:  Diagnosis Date   Anemia 2013   Iron Deficiency Anemia   Bradycardia 03/2012   Event monitor 8/13: NSR, sinus bradycardia, sinus tachycardia, no significant bradycardia arrhythmias   Chest pain 03/22/2012   Normal Myoview, normal echo, chest CT negative for pulmonary embolism   endometrial ca 04/2020   Family history of breast cancer 06/17/2020   History of colonoscopy    History of CT scan 2021   Billie Lade, MD   History of nuclear stress test 03/2012   Myoview 03/23/12: EF 50%, no ischemia or scar.   History of radiation therapy 06/23/20-07/07/20   vaginal brachytherapy - HDR- Endometrium, Dr. Roselind Messier   HTN (hypertension) 03/2012   2-D echocardiogram 03/22/12: EF 50%, mild LAE, no wall motion abnormalities.   Tubal pregnancy aprox 1998   In her late 41s   Vitamin D deficiency 2013    Past Surgical History:  Procedure Laterality Date   ABDOMINAL HYSTERECTOMY  2021   Billie Lade, MD, PhD   CESAREAN SECTION     x2   ECTOPIC PREGNANCY SURGERY     ROBOTIC ASSISTED TOTAL HYSTERECTOMY WITH BILATERAL SALPINGO OOPHERECTOMY N/A 05/01/2020   Procedure: XI ROBOTIC ASSISTED TOTAL HYSTERECTOMY WITH BILATERAL SALPINGO OOPHORECTOMY AND MINI LAPAROTOMY;  Surgeon: Carver Fila, MD;  Location: [facility];  Service: Gynecology;  Laterality: N/A;   SENTINEL NODE BIOPSY N/A 05/01/2020   Procedure: SENTINEL NODE BIOPSY;  Surgeon: Carver Fila, MD;  Location: [facility];  Service: Gynecology;  Laterality: N/A;   TUBAL LIGATION     Social History:   reports that she quit smoking about 6 years ago. Her smoking use included cigarettes. She has a 5.00 pack-year smoking history. She has never used smokeless tobacco. She reports current alcohol use. She reports current drug use. Drug: Marijuana.  Family History  Problem Relation Age of Onset   Hypertension Mother    Hypertension Father    Diabetes type II Sister    Hypertension Sister    Breast cancer Maternal Aunt        dx 12s   Hypertension Daughter    Breast cancer Cousin        maternal cousin; dx 46s   Breast cancer Cousin        dx early 24s   Colon  cancer Neg Hx    Ovarian cancer Neg Hx    Endometrial cancer Neg Hx    Pancreatic cancer Neg Hx    Prostate cancer Neg Hx     Medications: Patient's Medications  New Prescriptions   No medications on file  Previous Medications   AMLODIPINE (NORVASC) 10 MG TABLET    Take 1 tablet (10 mg total) by mouth daily. Needs an appointment before anymore future refills.   CALCIUM CARBONATE (TUMS - DOSED IN MG ELEMENTAL CALCIUM) 500 MG CHEWABLE TABLET    Chew 1 tablet by mouth as needed for indigestion.   FLUTICASONE (FLONASE) 50 MCG/ACT NASAL SPRAY    Place 1 spray into both nostrils daily as needed for allergies or rhinitis.   MECLIZINE HCL 25 MG CHEW    CHEW ONE TABLET BY MOUTH  THREE TIMES A DAY AS NEEDED   MULTIPLE VITAMIN (MULTIVITAMIN) TABLET    Take 1 tablet by mouth daily.   OMEPRAZOLE (PRILOSEC) 20 MG CAPSULE    Take 20 mg by mouth daily as needed.   PHENTERMINE 15 MG CAPSULE    Take 1 capsule (15 mg total) by mouth every morning.   VITAMIN D PO    Take 5,000 Units by mouth daily.  Modified Medications   No medications on file  Discontinued Medications   No medications on file    Physical Exam:  Vitals:   01/26/23 1608  BP: 138/86  Pulse: 74  Temp: (!) 97.4 F (36.3 C)  SpO2: 98%  Weight: 157 lb 9.6 oz (71.5 kg)  Height: 5\' 2"  (1.575 m)   Body mass index is 28.83 kg/m. Wt Readings from Last 3 Encounters:  01/26/23 157 lb 9.6 oz (71.5 kg)  12/21/22 159 lb 9.6 oz (72.4 kg)  09/17/22 167 lb 9.6 oz (76 kg)    Physical Exam Vitals and nursing note reviewed.  Constitutional:      Appearance: She is obese.  Cardiovascular:     Rate and Rhythm: Normal rate and regular rhythm.  Pulmonary:     Effort: Pulmonary effort is normal.     Breath sounds: Normal breath sounds.  Abdominal:     General: Bowel sounds are normal.     Palpations: Abdomen is soft.  Musculoskeletal:        General: Normal range of motion.  Skin:    General: Skin is warm and dry.  Neurological:     General: No focal deficit present.     Mental Status: She is alert and oriented to person, place, and time.  Psychiatric:        Mood and Affect: Mood normal.        Behavior: Behavior normal.     Labs reviewed: Basic Metabolic Panel: Recent Labs    06/01/22 1224 12/16/22 1024  BUN 14 14  CREATININE 0.75 0.77   Liver Function Tests: No results for input(s): "AST", "ALT", "ALKPHOS", "BILITOT", "PROT", "ALBUMIN" in the last 8760 hours. No results for input(s): "LIPASE", "AMYLASE" in the last 8760 hours. No results for input(s): "AMMONIA" in the last 8760 hours. CBC: No results for input(s): "WBC", "NEUTROABS", "HGB", "HCT", "MCV", "PLT" in the last 8760  hours. Lipid Panel: No results for input(s): "CHOL", "HDL", "LDLCALC", "TRIG", "CHOLHDL", "LDLDIRECT" in the last 8760 hours. TSH: No results for input(s): "TSH" in the last 8760 hours. A1C: Lab Results  Component Value Date   HGBA1C 5.2 01/20/2022     Assessment/Plan  1. Obesity (BMI 30-39.9) She is making progress  with the help of diet pill.  Will continue for 1 more month but per pharmacy records she is not due for a refill until the 20-21June  2. Primary hypertension Blood pressure is good today at 138/86 on amlodipine 10 mg   Jacalyn Lefevre, MD [facility] [phone]

## 2023-02-02 ENCOUNTER — Other Ambulatory Visit: Payer: Self-pay | Admitting: Family Medicine

## 2023-02-02 DIAGNOSIS — I10 Essential (primary) hypertension: Secondary | ICD-10-CM

## 2023-02-15 ENCOUNTER — Other Ambulatory Visit: Payer: Self-pay

## 2023-02-15 DIAGNOSIS — E669 Obesity, unspecified: Secondary | ICD-10-CM

## 2023-02-15 MED ORDER — PHENTERMINE HCL 15 MG PO CAPS: 15.0000 mg | ORAL_CAPSULE | ORAL | 2 refills | Status: DC

## 2023-02-15 NOTE — Telephone Encounter (Signed)
Patient called requesting refill on phentermine.  Medication pended and sent to Dr. Jacalyn Lefevre

## 2023-02-24 ENCOUNTER — Other Ambulatory Visit: Payer: Self-pay | Admitting: Family Medicine

## 2023-02-24 DIAGNOSIS — I10 Essential (primary) hypertension: Secondary | ICD-10-CM

## 2023-03-31 ENCOUNTER — Inpatient Hospital Stay: Payer: BLUE CROSS/BLUE SHIELD | Attending: Radiation Oncology | Admitting: Gynecologic Oncology

## 2023-03-31 ENCOUNTER — Other Ambulatory Visit: Payer: Self-pay

## 2023-03-31 ENCOUNTER — Encounter: Payer: Self-pay | Admitting: Gynecologic Oncology

## 2023-03-31 ENCOUNTER — Encounter: Payer: Self-pay | Admitting: *Deleted

## 2023-03-31 VITALS — BP 147/92 | HR 60 | Temp 98.3°F | Resp 18 | Ht 62.0 in | Wt 153.0 lb

## 2023-03-31 DIAGNOSIS — C541 Malignant neoplasm of endometrium: Secondary | ICD-10-CM

## 2023-03-31 DIAGNOSIS — Z8542 Personal history of malignant neoplasm of other parts of uterus: Secondary | ICD-10-CM | POA: Insufficient documentation

## 2023-03-31 DIAGNOSIS — Z90710 Acquired absence of both cervix and uterus: Secondary | ICD-10-CM | POA: Diagnosis not present

## 2023-03-31 DIAGNOSIS — Z90722 Acquired absence of ovaries, bilateral: Secondary | ICD-10-CM | POA: Diagnosis not present

## 2023-03-31 DIAGNOSIS — Z923 Personal history of irradiation: Secondary | ICD-10-CM | POA: Diagnosis not present

## 2023-03-31 NOTE — Progress Notes (Signed)
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 On intra-abdominal entry, normal upper abdominal survey. Normal small and large bowel. Omentum adherent to the anterior abdominal wall. Sigmoid epiploica adherent to the posterior uterus. Uterus 10-12cm, bulbous fundus. Adnexa adherent to posterior uterus, otherwise normal appearing. Mapping successful to right external and right obturator SLN, left obturator SLN, right presacral. All lymph nodes prominent.  No intra-abdominal or pelvic evidence of disease otherwise.  Some adhesions between the bladder and lower uterine segment/cervix, consistent with patient's prior C-section history.   05/01/2020 Pathology Results   A. UTERUS, CERVIX, BILATERAL TUBES AND OVARIES, RESECTION:   Uterus:  -  Endometrioid carcinoma, FIGO grade 2  -  See oncology table and comment below   Cervix:  -  No carcinoma identified   Bilateral Ovaries:  -  No carcinoma identified   B. LYMPH NODE, SENTINEL, RIGHT OBTURATOR, EXCISION:  -  No carcinoma identified in one lymph node (0/1)  -  See comment   C. LYMPH NODE, SENTINEL, RIGHT EXTERNAL ILIAC, EXCISION:  -  No carcinoma identified in one lymph node (0/1)  -  See comment   D. LYMPH NODE, SENTINEL, RIGHT PRE-SACRAL, EXCISION:  -  No carcinoma identified in one lymph node (0/1)  -  See comment   E. LYMPH NODE, SENTINEL, LEFT OBTURATOR, EXCISION:  -  No carcinoma  identified in one lymph node (0/1)  -  See comment URGICAL PATHOLOGY    05/01/2020 Cancer Staging   Staging form: Corpus Uteri - Carcinoma and Carcinosarcoma, AJCC 8th Edition - Clinical stage from 05/01/2020: FIGO Stage IB (cT1b, cN0(sn), cM0) - Signed by Carver Fila, MD on 05/08/2020   06/11/2020 Imaging   1. No findings of recurrent malignancy. 2. There are a few scattered small subcutaneous nodules primarily along the upper abdominal back, probably small foci of subcutaneous inflammation or small subcutaneous lymph nodes. 3. Peripheral enhancement in the dome of segment 7 of the liver, possibly a flash filling hemangioma but technically nonspecific. This could be further characterized with hepatic protocol MRI with and without contrast if clinically warranted. 4. 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 There is also some sclerosis in the left T10 and T11 pedicle, mildly increased from 2013, and probably from costovertebral arthropathy. 5. Dextroconvex thoracic scoliosis. 6. Mild cardiomegaly. 7. Facet arthropathy in the lumbar spine especially on the left at L4-5.   06/23/2020 - 07/07/2020 Radiation Therapy   06/23/2020 through 07/07/2020 Site Technique Total Dose (Gy) Dose per Fx (Gy) Completed Fx Beam Energies  Vagina: Pelvis HDR-brachy 30/30 6 5/5 Ir-192       07/07/2020 Genetic Testing   Negative genetic testing: no pathogenic variants detected in [facility] CancerNext-Expanded +RNAinsight Panel.  The report date is July 07, 2020.   The CancerNext-Expanded gene panel offered by [facility] and includes sequencing, rearrangement, and RNA analysis for the following 77 genes: AIP, ALK, APC, ATM, AXIN2, BAP1,  BARD1, BLM, BMPR1A, BRCA1, BRCA2, BRIP1, CDC73, CDH1, CDK4, CDKN1B, CDKN2A, CHEK2, CTNNA1, DICER1, FANCC, FH, FLCN, GALNT12, KIF1B, LZTR1, MAX, MEN1, MET, MLH1, MSH2, MSH3, MSH6,  MUTYH, NBN, NF1, NF2, NTHL1, PALB2, PHOX2B, PMS2, POT1, PRKAR1A, PTCH1, PTEN, RAD51C, RAD51D, RB1, RECQL, RET, SDHA, SDHAF2, SDHB, SDHC, SDHD, SMAD4, SMARCA4, SMARCB1, SMARCE1, STK11, SUFU, TMEM127, TP53, TSC1, TSC2, VHL and XRCC2 (sequencing and deletion/duplication); EGFR, EGLN1, HOXB13, KIT, MITF, PDGFRA, POLD1, and POLE (sequencing only); EPCAM and GREM1 (deletion/duplication only).      Interval History: Doing very well.  Feels significantly better after losing some weight.  She denies any vaginal bleeding or discharge.  Still uses her vaginal dilator regularly.  Endorses normal bowel and bladder function.  Just returned from a cruise to the [place] to celebrate her cousin's 50th birthday.  Past Medical/Surgical History: Past Medical History:  Diagnosis Date   Anemia 2013   Iron Deficiency Anemia   Bradycardia 03/2012   Event monitor 8/13: NSR, sinus bradycardia, sinus tachycardia, no significant bradycardia arrhythmias   Chest pain 03/22/2012   Normal Myoview, normal echo, chest CT negative for pulmonary embolism   endometrial ca 04/2020   Family history of breast cancer 06/17/2020   History of colonoscopy    History of CT scan 2021   Billie Lade, MD   History of nuclear stress test 03/2012   Myoview 03/23/12: EF 50%, no ischemia or scar.   History of radiation therapy 06/23/20-07/07/20   vaginal brachytherapy - HDR- Endometrium, Dr. Roselind Messier   HTN (hypertension) 03/2012   2-D echocardiogram 03/22/12: EF 50%, mild LAE, no wall motion abnormalities.   Tubal pregnancy aprox 1998   In her late 43s   Vitamin D deficiency 2013    Past Surgical History:  Procedure Laterality Date   ABDOMINAL HYSTERECTOMY  2021   Billie Lade, MD, PhD   CESAREAN SECTION     x2   ECTOPIC PREGNANCY SURGERY     ROBOTIC ASSISTED TOTAL HYSTERECTOMY WITH BILATERAL SALPINGO OOPHERECTOMY N/A 05/01/2020   Procedure: XI ROBOTIC ASSISTED TOTAL HYSTERECTOMY WITH BILATERAL SALPINGO OOPHORECTOMY AND MINI  LAPAROTOMY;  Surgeon: Carver Fila, MD;  Location: [facility];  Service: Gynecology;  Laterality: N/A;   SENTINEL NODE BIOPSY N/A 05/01/2020   Procedure: SENTINEL NODE BIOPSY;  Surgeon: Carver Fila, MD;  Location: [facility];  Service: Gynecology;  Laterality: N/A;   TUBAL LIGATION      Family History  Problem Relation Age of Onset   Hypertension Mother    Hypertension Father    Diabetes type II Sister    Hypertension Sister    Breast cancer Maternal Aunt        dx 29s   Hypertension Daughter    Breast cancer Cousin        maternal cousin; dx 60s   Breast cancer Cousin        dx early 48s   Colon cancer Neg Hx    Ovarian cancer Neg Hx    Endometrial cancer Neg Hx    Pancreatic cancer Neg Hx    Prostate cancer Neg Hx     Social History   Socioeconomic History   Marital status: Married    Spouse name: Not on file   Number of children: 2   Years of education: Not on file   Highest education level: Not on file  Occupational History   Occupation: packer/driver    Comment: [employer]  Tobacco Use   Smoking status: Former  Current packs/day: 0.00    Average packs/day: 0.2 packs/day for 25.0 years (5.0 ttl pk-yrs)    Types: Cigarettes    Start date: 09/30/1991    Quit date: 09/29/2016    Years since quitting: 6.5   Smokeless tobacco: Never  Vaping Use   Vaping status: Never Used  Substance and Sexual Activity   Alcohol use: Yes    Comment: occasional   Drug use: Yes    Types: Marijuana    Comment: occ    Sexual activity: Yes  Other Topics Concern   Not on file  Social History Narrative   Tobacco use, amount per day now: Quit 4 years ago.   Past tobacco use, amount per day: 1 pack per day.   How many years did you use tobacco: Age [age]   Alcohol use (drinks per week): Special Occassions   Diet: No   Do you drink/eat things with caffeine: Occasional Soda.   Marital status: Married                                 What year were you married? 2020    Do you live in a house, apartment, assisted living, condo, trailer, etc.? House   Is it one or more stories? 3 stories.   How many persons live in your home? 3   Do you have pets in your home?( please list) Yes, 2 Dogs.    Highest Level of education completed? 12th Grade   Current or past profession: Mover/Packer   Do you exercise? Yes                                 Type and how often? Walking, Cardio, and Weights x 3 days a week.   Do you have a living will? No   Do you have a DNR form?   No                                If not, do you want to discuss one?   Do you have signed POA/HPOA forms? No                       If so, please bring to you appointment      Do you have any difficulty bathing or dressing yourself? No.   Do you have any difficulty preparing food or eating? No.   Do you have any difficulty managing your medications? No.   Do you have any difficulty managing your finances? No.   Do you have any difficulty affording your medications? No.   Social Determinants of Health   Financial Resource Strain: Not on file  Food Insecurity: Not on file  Transportation Needs: Not on file  Physical Activity: Not on file  Stress: Not on file  Social Connections: Not on file    Current Medications:  Current Outpatient Medications:    amLODipine (NORVASC) 10 MG tablet, TAKE 1 TABLET BY MOUTH DAILY; **MUST CALL MD FOR APPOINTMENT FOR FUTURE REFILLS !, Disp: 90 tablet, Rfl: 1   calcium carbonate (TUMS - DOSED IN MG ELEMENTAL CALCIUM) 500 MG chewable tablet, Chew 1 tablet by mouth as needed for indigestion., Disp: , Rfl:    fluticasone (FLONASE) 50 MCG/ACT nasal spray, Place 1 spray into both  nostrils daily as needed for allergies or rhinitis., Disp: , Rfl:    Meclizine HCl 25 MG CHEW, CHEW ONE TABLET BY MOUTH THREE TIMES A DAY AS NEEDED, Disp: 20 tablet, Rfl: 1   Multiple Vitamin (MULTIVITAMIN) tablet, Take 1 tablet by mouth daily., Disp: , Rfl:    omeprazole (PRILOSEC) 20 MG  capsule, Take 20 mg by mouth daily as needed., Disp: , Rfl:    phentermine 15 MG capsule, Take 1 capsule (15 mg total) by mouth every morning., Disp: 30 capsule, Rfl: 2   VITAMIN D PO, Take 5,000 Units by mouth daily., Disp: , Rfl:   Review of Systems: Denies appetite changes, fevers, chills, fatigue, unexplained weight changes. Denies hearing loss, neck lumps or masses, mouth sores, ringing in ears or voice changes. Denies cough or wheezing.  Denies shortness of breath. Denies chest pain or palpitations. Denies leg swelling. Denies abdominal distention, pain, blood in stools, constipation, diarrhea, nausea, vomiting, or early satiety. Denies pain with intercourse, dysuria, frequency, hematuria or incontinence. Denies hot flashes, pelvic pain, vaginal bleeding or vaginal discharge.   Denies joint pain, back pain or muscle pain/cramps. Denies itching, rash, or wounds. Denies dizziness, headaches, numbness or seizures. Denies swollen lymph nodes or glands, denies easy bruising or bleeding. Denies anxiety, depression, confusion, or decreased concentration.  Physical Exam: BP (!) 147/92 (BP Location: Right Arm, Patient Position: Sitting) Comment: MD notified  Pulse 60   Temp 98.3 F (36.8 C) (Oral)   Resp 18   Ht 5\' 2"  (1.575 m)   Wt 153 lb (69.4 kg)   LMP 10/27/2016   SpO2 100%   BMI 27.98 kg/m  General: Alert, oriented, no acute distress. HEENT: Normocephalic, atraumatic, sclera anicteric. Chest: Clear to auscultation bilaterally.  No wheezes or rhonchi. Cardiovascular: Regular rate and rhythm, no murmurs. Abdomen: soft, nontender.  Normoactive bowel sounds.  No masses or hepatosplenomegaly appreciated.  Well-healed incisions. Extremities: Grossly normal range of motion.  Warm, well perfused.  No edema bilaterally. Skin: No rashes or lesions noted. Lymphatics: No cervical, supraclavicular, or inguinal adenopathy. GU: Normal appearing external genitalia without erythema,  excoriation, or lesions.  Speculum exam reveals mildly atrophic vaginal mucosa, radiation changes present.  No bleeding or discharge, no lesions.  Bimanual exam reveals cuff intact, no nodularity or masses.  Rectovaginal exam confirms these findings.  Laboratory & Radiologic Studies: CT C/A/P 12/2022:   Status post hysterectomy.   18 mm enhancing lesion in the posterior right hepatic lobe, previously 15 mm in 2021, favoring a benign FNH or hemangioma. Definitive characterization via MR can be performed as clinically warranted.   No evidence of recurrent or metastatic disease.  Assessment & Plan: Christy Waller is a 52 y.o. 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 On NRG-GY020.  Completed adjuvant vaginal brachytherapy in November 2021.   Patient is doing well from a cancer standpoint and is NED on exam today.   Per study protocol, will continue with every 3 month surveillance visits for 3 years and then transition to visits every 6 months.  After her visit with Dr. Roselind Messier in November, we will transition to visits every 6 months. I will see her back in May 2025. Reviewed signs and symptoms that should prompt a phone call between visits.  Since she will continue to have regular scans with the study protocol, will hold off getting MR to reassess the liver lesion.  Can consider this in the future if any change in size or character of the lesion.   Patient was congratulated on her weight loss..  20 minutes of total time was spent for this patient encounter, including preparation, face-to-face counseling with the patient and coordination of care, and documentation of the encounter.  Eugene Garnet, MD  Division of Gynecologic Oncology  Department of Obstetrics and Gynecology  [facility]

## 2023-03-31 NOTE — Research (Signed)
NRG-GY020 Arm 1, Follow-Up Visit 7 Patient comes into clinic today unaccompanied for follow up visit 6.    H&P Completed by Dr. Pricilla Holm today. See MD encounter note for details.   Adverse Events No SAEs or hospitalizations since last study visit.    Plan  H & P with clinical exam will continue every 3 months alternating between Dr. Roselind Messier and Dr. Pricilla Holm.  Next visit with Dr. Roselind Messier is scheduled for 06/21/23.  CT scan is due 06/20/23 and will be ordered/ scheduled. After the visit in November visits will continue every 6 months and plan to continue to alternate between Dr. Roselind Messier and Dr. Pricilla Holm. Patient verbalized understanding of plan.     Thanked patient for her ongoing participation in this clinical trial. Encouraged patient to contact research nurse if any questions or concerns prior to next contact. Patient verbalized understanding.   Domenica Reamer, BSN, RN, [employer] Research Nurse II [phone] 03/31/2023

## 2023-03-31 NOTE — Patient Instructions (Signed)
It was good to see you today.  I do not see or feel any evidence of cancer recurrence on your exam.  I will see you for follow-up in 9 months.  As always, if you develop any new and concerning symptoms before your next visit, please call to see me sooner.

## 2023-04-01 ENCOUNTER — Telehealth: Payer: Self-pay | Admitting: *Deleted

## 2023-04-01 NOTE — Telephone Encounter (Signed)
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  Patient to arrive to [facility] at 8:45 am to drink contrast.  Asked patient to call research nurse if she needs to reschedule this appointment.  She verbalized understanding and also confirmed her appointment to see Dr. Roselind Messier on 06/21/23.  Domenica Reamer, BSN, RN, [employer] Research Nurse II [phone] 04/01/2023 9:17 AM

## 2023-06-15 ENCOUNTER — Telehealth: Payer: Self-pay | Admitting: *Deleted

## 2023-06-15 NOTE — Telephone Encounter (Signed)
NRG-GY020- Patient canceled her appt for CT scan next week due to no insurance.  Called patient and she states she also needs to cancel her appointment with Dr. Roselind Messier next week for same reason. Patient states her insurance was canceled and she has no idea why. She plans to try to get new insurance but not sure when she will be able to do this. She says she cannot afford to pay out of pocket for CT scan or MD appointment.  Informed patient that I will try to find out how much her visit with Dr. Roselind Messier will cost out of pocket and let her know before we cancel it.  She verbalized understanding.  Domenica Reamer, BSN, RN, [employer] Research Nurse II [phone] 06/15/2023 12:55 PM

## 2023-06-16 ENCOUNTER — Telehealth: Payer: Self-pay | Admitting: Radiation Oncology

## 2023-06-16 NOTE — Telephone Encounter (Signed)
Called patient and left voicemail to call me regarding insurance.  Trying to obtain auth for upcoming CT's and patients BCBS has lapsed.   Need to know current insurance if any.

## 2023-06-20 ENCOUNTER — Telehealth: Payer: Self-pay | Admitting: *Deleted

## 2023-06-20 ENCOUNTER — Ambulatory Visit (HOSPITAL_COMMUNITY): Payer: Self-pay

## 2023-06-20 NOTE — Telephone Encounter (Signed)
Domenica Reamer, BSN, RN, [employer] Research Nurse II [phone] 06/20/2023 5:12 PM

## 2023-06-21 ENCOUNTER — Ambulatory Visit
Admission: RE | Admit: 2023-06-21 | Discharge: 2023-06-21 | Disposition: A | Discharge status: Home / Self Care | Payer: Self-pay | Source: Ambulatory Visit | Attending: Radiation Oncology | Admitting: Radiation Oncology

## 2023-06-22 ENCOUNTER — Encounter: Payer: Self-pay | Admitting: Gastroenterology

## 2023-07-04 NOTE — Progress Notes (Signed)
Radiation Oncology         [phone] ________________________________  Name: Christy Waller MRN: [id number]  Date: 07/05/2023  DOB: [birth year]  Follow-Up Visit Note  CC: Venita Sheffield, MD  [place], [facility]*  No diagnosis found.  Diagnosis: Stage IB (pT1b, pN0) grade 2 endometrioid carcinoma with extensive LVSI    Interval Since Last Radiation: 2 years, 11 months, and 28 days   Radiation Treatment Dates: 06/23/2020 through 07/07/2020 Site Technique Total Dose (Gy) Dose per Fx (Gy) Completed Fx Beam Energies  Vagina: Pelvis HDR-brachy 30/30 6 5/5 Ir-192    Narrative:  The patient returns today for routine follow-up. She was last seen here for follow-up on 12/21/22. Since her her last visit, she most recently followed up with Dr. Pricilla Holm on 03/31/23. During which time, she denied any symptoms concerning for disease recurrence and she was noted as NED on examination.          No other significant oncologic interval history since she was last seen for follow-up.   Of note: She recently lost her insurance for an unknown reason.   ***                        Allergies:  is allergic to lisinopril.  Meds: Current Outpatient Medications  Medication Sig Dispense Refill   amLODipine (NORVASC) 10 MG tablet TAKE 1 TABLET BY MOUTH DAILY; **MUST CALL MD FOR APPOINTMENT FOR FUTURE REFILLS ! 90 tablet 1   calcium carbonate (TUMS - DOSED IN MG ELEMENTAL CALCIUM) 500 MG chewable tablet Chew 1 tablet by mouth as needed for indigestion.     fluticasone (FLONASE) 50 MCG/ACT nasal spray Place 1 spray into both nostrils daily as needed for allergies or rhinitis.     Meclizine HCl 25 MG CHEW CHEW ONE TABLET BY MOUTH THREE TIMES A DAY AS NEEDED 20 tablet 1   Multiple Vitamin (MULTIVITAMIN) tablet Take 1 tablet by mouth daily.     omeprazole (PRILOSEC) 20 MG capsule Take 20 mg by mouth daily as needed.     phentermine 15 MG capsule Take 1 capsule (15 mg total) by mouth every morning.  30 capsule 2   VITAMIN D PO Take 5,000 Units by mouth daily.     No current facility-administered medications for this encounter.    Physical Findings: The patient is in no acute distress. Patient is alert and oriented.  vitals were not taken for this visit. .  No significant changes. Lungs are clear to auscultation bilaterally. Heart has regular rate and rhythm. No palpable cervical, supraclavicular, or axillary adenopathy. Abdomen soft, non-tender, normal bowel sounds.  On pelvic examination the external genitalia were unremarkable. A speculum exam was performed. There are no mucosal lesions noted in the vaginal vault. A Pap smear was obtained of the proximal vagina. On bimanual and rectovaginal examination there were no pelvic masses appreciated. ***    Lab Findings: Lab Results  Component Value Date   WBC 7.0 01/20/2022   HGB 13.3 01/20/2022   HCT 41.8 01/20/2022   MCV 87.8 01/20/2022   PLT 276 01/20/2022    Radiographic Findings: No results found.  Impression: Stage IB (pT1b, pN0) grade 2 endometrioid carcinoma with extensive LVSI    The patient is recovering from the effects of radiation.  ***  Plan:  ***   *** minutes of total time was spent for this patient encounter, including preparation, face-to-face counseling with the patient and coordination of care, physical  exam, and documentation of the encounter. ____________________________________  Billie Lade, PhD, MD  This document serves as a record of services personally performed by Antony Blackbird, MD. It was created on his behalf by Neena Rhymes, a trained medical scribe. The creation of this record is based on the scribe's personal observations and the provider's statements to them. This document has been checked and approved by the attending provider.

## 2023-07-05 ENCOUNTER — Encounter: Payer: Self-pay | Admitting: Radiation Oncology

## 2023-07-05 ENCOUNTER — Encounter: Payer: Self-pay | Admitting: *Deleted

## 2023-07-05 ENCOUNTER — Ambulatory Visit
Admission: RE | Admit: 2023-07-05 | Discharge: 2023-07-05 | Disposition: A | Discharge status: Home / Self Care | Payer: Self-pay | Source: Ambulatory Visit | Attending: Radiation Oncology | Admitting: Radiation Oncology

## 2023-07-05 VITALS — BP 156/91 | HR 58 | Temp 97.5°F | Resp 20 | Wt 152.8 lb

## 2023-07-05 DIAGNOSIS — C541 Malignant neoplasm of endometrium: Secondary | ICD-10-CM

## 2023-07-05 DIAGNOSIS — Z923 Personal history of irradiation: Secondary | ICD-10-CM | POA: Insufficient documentation

## 2023-07-05 DIAGNOSIS — Z79899 Other long term (current) drug therapy: Secondary | ICD-10-CM | POA: Insufficient documentation

## 2023-07-05 DIAGNOSIS — Z8542 Personal history of malignant neoplasm of other parts of uterus: Secondary | ICD-10-CM | POA: Insufficient documentation

## 2023-07-05 NOTE — Research (Signed)
 NRG-GY020 Arm 1, Follow-Up Visit 8 Patient comes into clinic today unaccompanied for follow up visit 8.    H&P Completed by Dr. Roselind Messier today. See MD encounter note for details.   Adverse Events No SAEs or hospitalizations since last study visit.    Plan  H & P with clinical exam will continue every 6 months alternating between Dr. Roselind Messier and Dr. Pricilla Holm.  Next visit with Dr. Roselind Messier is due in one year and will be scheduled.  Dr. Pricilla Holm is scheduled in 6 months on 12/29/23 and patient is aware.   Protocol Deviation- CT Scan Study required CT scan is overdue for week 156. Patient lost her insurance and is unable to afford the CT scan at this time. Patient states she is trying to get new insurance and will let research know when she has insurance so that CT scan can be re-scheduled. Dr. Roselind Messier is aware and states this does not affect the safety of the patient. He would not be ordering CT scans routinely during follow up at this time. He also agrees it does not affect the integrity of the study.      Thanked patient for her ongoing participation in this clinical trial. Encouraged patient to contact research nurse if any questions or concerns prior to next contact. Patient verbalized understanding.   Domenica Reamer, BSN, RN, [employer] Research Nurse II [phone] 07/05/2023

## 2023-07-05 NOTE — Progress Notes (Signed)
Christy Waller is here today for follow up post radiation to the pelvic.  They completed their radiation on: 07/07/20  Does the patient complain of any of the following:  Pain: No Abdominal bloating: No Diarrhea/Constipation: No Nausea/Vomiting: No Vaginal Discharge: no Blood in Urine or Stool: No Urinary Issues (dysuria/incomplete emptying/ incontinence/ increased frequency/urgency): No Does patient report using vaginal dilator 2-3 times a week and/or sexually active 2-3 weeks: Yes Post radiation skin changes: No   Additional comments if applicable:    BP (!) 156/91   Pulse (!) 58   Temp (!) 97.5 F (36.4 C)   Resp 20   Wt 152 lb 12.8 oz (69.3 kg)   LMP 10/27/2016   BMI 27.95 kg/m

## 2023-07-27 ENCOUNTER — Encounter: Payer: Self-pay | Admitting: Sports Medicine

## 2023-07-29 NOTE — Progress Notes (Signed)
This encounter was created in error - please disregard.

## 2023-08-03 ENCOUNTER — Ambulatory Visit: Payer: Self-pay | Admitting: Family Medicine

## 2023-09-07 ENCOUNTER — Other Ambulatory Visit: Payer: Self-pay | Admitting: Family Medicine

## 2023-09-07 DIAGNOSIS — I10 Essential (primary) hypertension: Secondary | ICD-10-CM

## 2023-09-08 ENCOUNTER — Telehealth: Payer: Self-pay

## 2023-09-08 NOTE — Telephone Encounter (Signed)
Patient had a refill request for amLODipine (NORVASC) 10 MG tablet . Patient was given 30 days no refill and attempted to call and set up en appointment.

## 2023-10-21 ENCOUNTER — Other Ambulatory Visit: Payer: Self-pay | Admitting: Sports Medicine

## 2023-10-21 DIAGNOSIS — I10 Essential (primary) hypertension: Secondary | ICD-10-CM

## 2023-10-21 NOTE — Telephone Encounter (Signed)
 Copied from CRM [id number]. Topic: Clinical - Medication Refill >> Oct 21, 2023 11:15 AM Corin V wrote: Most Recent Primary Care Visit:   Medication: amLODipine (NORVASC) 10 MG tablet  Has the patient contacted their pharmacy? Yes (Agent: If no, request that the patient contact the pharmacy for the refill. If patient does not wish to contact the pharmacy document the reason why and proceed with request.) (Agent: If yes, when and what did the pharmacy advise?)  Is this the correct pharmacy for this prescription? Yes If no, delete pharmacy and type the correct one.  This is the patient's preferred pharmacy:  [facility] [id number] - [place], [place] - [address] [place] [address] Phone: [phone] Fax: [phone]   Has the prescription been filled recently? No  Is the patient out of the medication? No  Has the patient been seen for an appointment in the last year OR does the patient have an upcoming appointment? Yes  Can we respond through MyChart? Yes  Agent: Please be advised that Rx refills may take up to 3 business days. We ask that you follow-up with your pharmacy.

## 2023-10-25 MED ORDER — AMLODIPINE BESYLATE 10 MG PO TABS: 10.0000 mg | ORAL_TABLET | Freq: Every day | ORAL | 2 refills | Status: DC

## 2023-10-25 NOTE — Telephone Encounter (Signed)
 Patient only given 30 day supply. Medication pend and sent to PCP Venita Sheffield, MD for approval.

## 2023-11-15 ENCOUNTER — Telehealth: Payer: Self-pay | Admitting: *Deleted

## 2023-11-15 NOTE — Telephone Encounter (Signed)
 [id number] Called patient to discuss upcoming appointment with Dr. Pricilla Holm on 5/16 at 3:30 pm.  Patient confirmed stating she plans to keep appt although she still does not have medical insurance. She state she has not been able to afford it.  Patient is therefore at this time still unable to get a CT scan as required by study protocol because she cannot afford it.   Asked patient to call research if she cannot make the appointment on 5/16 or if she does obtain medical insurance prior to that date and we can schedule a CT scan.  Patient verbalized understanding.  Domenica Reamer, BSN, RN, [employer] Clinical Research Nurse II [phone] 11/15/2023 10:20 AM

## 2023-12-28 ENCOUNTER — Encounter: Payer: Self-pay | Admitting: Gynecologic Oncology

## 2023-12-29 ENCOUNTER — Ambulatory Visit: Payer: BLUE CROSS/BLUE SHIELD | Admitting: Gynecologic Oncology

## 2023-12-30 ENCOUNTER — Encounter: Payer: Self-pay | Admitting: *Deleted

## 2023-12-30 ENCOUNTER — Inpatient Hospital Stay: Payer: Self-pay | Attending: Gynecologic Oncology | Admitting: Gynecologic Oncology

## 2023-12-30 ENCOUNTER — Encounter: Payer: Self-pay | Admitting: Gynecologic Oncology

## 2023-12-30 VITALS — BP 137/80 | HR 66 | Temp 98.3°F | Resp 18 | Ht 61.5 in | Wt 152.6 lb

## 2023-12-30 DIAGNOSIS — Z90722 Acquired absence of ovaries, bilateral: Secondary | ICD-10-CM | POA: Insufficient documentation

## 2023-12-30 DIAGNOSIS — C541 Malignant neoplasm of endometrium: Secondary | ICD-10-CM

## 2023-12-30 DIAGNOSIS — Z923 Personal history of irradiation: Secondary | ICD-10-CM | POA: Insufficient documentation

## 2023-12-30 DIAGNOSIS — Z8542 Personal history of malignant neoplasm of other parts of uterus: Secondary | ICD-10-CM | POA: Insufficient documentation

## 2023-12-30 DIAGNOSIS — D376 Neoplasm of uncertain behavior of liver, gallbladder and bile ducts: Secondary | ICD-10-CM | POA: Insufficient documentation

## 2023-12-30 DIAGNOSIS — Z90710 Acquired absence of both cervix and uterus: Secondary | ICD-10-CM | POA: Insufficient documentation

## 2023-12-30 DIAGNOSIS — Z9079 Acquired absence of other genital organ(s): Secondary | ICD-10-CM | POA: Insufficient documentation

## 2023-12-30 NOTE — Research (Signed)
 [id number] Arm 1, Follow-Up Visit 9 Patient comes into clinic today unaccompanied for follow up visit 9.    H&P Completed by Dr. Orvil Bland today. See MD encounter note for details.   Adverse Events No SAEs or hospitalizations since last study visit.    Plan  H & P with clinical exam will continue every 6 months alternating between Dr. Eloise Hake and Dr. Orvil Bland.  Next visit with Dr. Orvil Bland is due in one year and will be scheduled.  Dr. Eloise Hake is scheduled in 6 months on 06/20/24 and patient is aware.   Protocol Deviation- CT Scan not done- Study required CT scan is overdue for week 156. Patient lost her insurance and is unable to afford the protocol required CT scan. Patient states her situation has not changed since last study visit and she is still unable to afford health insurance.    Thanked patient for her ongoing participation in this clinical trial and continuing to come to scheduled visits. Encouraged patient to contact research nurse if any questions or concerns prior to next contact. Patient verbalized understanding.   Aurora Lees, BSN, RN, [employer] Research Nurse II [phone] 12/30/2023

## 2023-12-30 NOTE — Patient Instructions (Signed)
 It was good to see you today.  I do not see or feel any evidence of cancer recurrence on your exam.  I will see you for follow-up in 12 months.  Please make sure that either Dr. Royanne Core name or the study team helps to get that appointment scheduled when you come back this winter.  As always, if you develop any new and concerning symptoms before your next visit, please call to see me sooner.

## 2023-12-30 NOTE — Progress Notes (Signed)
 Gynecologic Oncology Return Clinic Visit  12/30/23  Reason for Visit:  surveillance in the setting of high-intermediate risk uterine cancer    Treatment History: Oncology History Overview Note  MMR IHC - loss of MSH2 and 6 expression MSI - High NEGATIVE germline testing   Endometrial cancer [facility])   Initial Diagnosis   Endometrial cancer (HCC)   04/17/2020 Initial Biopsy   EMB: Grade 2 endometrioid adenocarcinoma    05/01/2020 Surgery   Robotic-assisted laparoscopic total hysterectomy with bilateral salpingoophorectomy, SLN biopsy, mini-lap for specimen removal  On EUA, 10cm mobile uterus. On intra-abdominal entry, normal upper abdominal survey. Normal small and large bowel. Omentum adherent to the anterior abdominal wall. Sigmoid epiploica adherent to the posterior uterus. Uterus 10-12cm, bulbous fundus. Adnexa adherent to posterior uterus, otherwise normal appearing. Mapping successful to right external and right obturator SLN, left obturator SLN, right presacral. All lymph nodes prominent.  No intra-abdominal or pelvic evidence of disease otherwise.  Some adhesions between the bladder and lower uterine segment/cervix, consistent with patient's prior C-section history.   05/01/2020 Pathology Results   A. UTERUS, CERVIX, BILATERAL TUBES AND OVARIES, RESECTION:   Uterus:  -  Endometrioid carcinoma, FIGO grade 2  -  See oncology table and comment below   Cervix:  -  No carcinoma identified   Bilateral Ovaries:  -  No carcinoma identified   B. LYMPH NODE, SENTINEL, RIGHT OBTURATOR, EXCISION:  -  No carcinoma identified in one lymph node (0/1)  -  See comment   C. LYMPH NODE, SENTINEL, RIGHT EXTERNAL ILIAC, EXCISION:  -  No carcinoma identified in one lymph node (0/1)  -  See comment   D. LYMPH NODE, SENTINEL, RIGHT PRE-SACRAL, EXCISION:  -  No carcinoma identified in one lymph node (0/1)  -  See comment   E. LYMPH NODE, SENTINEL, LEFT OBTURATOR, EXCISION:  -  No carcinoma  identified in one lymph node (0/1)  -  See comment URGICAL PATHOLOGY    05/01/2020 Cancer Staging   Staging form: Corpus Uteri - Carcinoma and Carcinosarcoma, AJCC 8th Edition - Clinical stage from 05/01/2020: FIGO Stage IB (cT1b, cN0(sn), cM0) - Signed by Suzi Essex, MD on 05/08/2020   06/11/2020 Imaging   1. No findings of recurrent malignancy. 2. There are a few scattered small subcutaneous nodules primarily along the upper abdominal back, probably small foci of subcutaneous inflammation or small subcutaneous lymph nodes. 3. Peripheral enhancement in the dome of segment 7 of the liver, possibly a flash filling hemangioma but technically nonspecific. This could be further characterized with hepatic protocol MRI with and without contrast if clinically warranted. 4. 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 There is also some sclerosis in the left T10 and T11 pedicle, mildly increased from 2013, and probably from costovertebral arthropathy. 5. Dextroconvex thoracic scoliosis. 6. Mild cardiomegaly. 7. Facet arthropathy in the lumbar spine especially on the left at L4-5.   06/23/2020 - 07/07/2020 Radiation Therapy   06/23/2020 through 07/07/2020 Site Technique Total Dose (Gy) Dose per Fx (Gy) Completed Fx Beam Energies  Vagina: Pelvis HDR-brachy 30/30 6 5/5 Ir-192       07/07/2020 Genetic Testing   Negative genetic testing: no pathogenic variants detected in [facility] CancerNext-Expanded +RNAinsight Panel.  The report date is July 07, 2020.   The CancerNext-Expanded gene panel offered by [facility] and includes sequencing, rearrangement, and RNA analysis for the following 77 genes: AIP, ALK, APC, ATM,  AXIN2, BAP1, BARD1, BLM, BMPR1A, BRCA1, BRCA2, BRIP1, CDC73, CDH1, CDK4, CDKN1B, CDKN2A, CHEK2, CTNNA1, DICER1, FANCC, FH, FLCN, GALNT12, KIF1B, LZTR1, MAX, MEN1, MET, MLH1, MSH2, MSH3, MSH6,  MUTYH, NBN, NF1, NF2, NTHL1, PALB2, PHOX2B, PMS2, POT1, PRKAR1A, PTCH1, PTEN, RAD51C, RAD51D, RB1, RECQL, RET, SDHA, SDHAF2, SDHB, SDHC, SDHD, SMAD4, SMARCA4, SMARCB1, SMARCE1, STK11, SUFU, TMEM127, TP53, TSC1, TSC2, VHL and XRCC2 (sequencing and deletion/duplication); EGFR, EGLN1, HOXB13, KIT, MITF, PDGFRA, POLD1, and POLE (sequencing only); EPCAM and GREM1 (deletion/duplication only).      Interval History: Doing well.  Denies any bleeding or discharge.  Reports baseline bowel bladder function.  Denies any abdominal or pelvic pain.  Continues to use her vaginal dilator regularly.  Looking forward to a trip to [place] at the end of the year.  Past Medical/Surgical History: Past Medical History:  Diagnosis Date   Anemia 2013   Iron Deficiency Anemia   Bradycardia 03/2012   Event monitor 8/13: NSR, sinus bradycardia, sinus tachycardia, no significant bradycardia arrhythmias   Chest pain 03/22/2012   Normal Myoview , normal echo, chest CT negative for pulmonary embolism   endometrial ca 04/2020   Family history of breast cancer 06/17/2020   History of colonoscopy    History of CT scan 2021   Noralee Beam, MD   History of nuclear stress test 03/2012   Myoview  03/23/12: EF 50%, no ischemia or scar.   History of radiation therapy 06/23/20-07/07/20   vaginal brachytherapy - HDR- Endometrium, Dr. Eloise Hake   HTN (hypertension) 03/2012   2-D echocardiogram 03/22/12: EF 50%, mild LAE, no wall motion abnormalities.   Tubal pregnancy aprox 1998   In her late 38s   Vitamin D  deficiency 2013    Past Surgical History:  Procedure Laterality Date   ABDOMINAL HYSTERECTOMY  2021   Noralee Beam, MD, PhD   CESAREAN SECTION     x2   ECTOPIC PREGNANCY SURGERY     ROBOTIC ASSISTED TOTAL HYSTERECTOMY WITH BILATERAL SALPINGO OOPHERECTOMY N/A 05/01/2020   Procedure: XI ROBOTIC ASSISTED TOTAL HYSTERECTOMY WITH BILATERAL SALPINGO OOPHORECTOMY AND MINI LAPAROTOMY;  Surgeon: Suzi Essex, MD;  Location:  [facility];  Service: Gynecology;  Laterality: N/A;   SENTINEL NODE BIOPSY N/A 05/01/2020   Procedure: SENTINEL NODE BIOPSY;  Surgeon: Suzi Essex, MD;  Location: [facility];  Service: Gynecology;  Laterality: N/A;   TUBAL LIGATION      Family History  Problem Relation Age of Onset   Hypertension Mother    Hypertension Father    Diabetes type II Sister    Hypertension Sister    Breast cancer Maternal Aunt        dx 28s   Hypertension Daughter    Breast cancer Cousin        maternal cousin; dx 27s   Breast cancer Cousin        dx early 71s   Colon cancer Neg Hx    Ovarian cancer Neg Hx    Endometrial cancer Neg Hx    Pancreatic cancer Neg Hx    Prostate cancer Neg Hx     Social History   Socioeconomic History   Marital status: Married    Spouse name: Not on file   Number of children: 2   Years of education: Not on file   Highest education level: Not on file  Occupational History   Occupation: packer/driver    Comment: [employer]  Tobacco Use   Smoking status: Former    Current packs/day: 0.00  Average packs/day: 0.2 packs/day for 25.0 years (5.0 ttl pk-yrs)    Types: Cigarettes    Start date: 09/30/1991    Quit date: 09/29/2016    Years since quitting: 7.2   Smokeless tobacco: Never  Vaping Use   Vaping status: Never Used  Substance and Sexual Activity   Alcohol use: Yes    Comment: occasional   Drug use: Yes    Types: Marijuana    Comment: occ    Sexual activity: Yes  Other Topics Concern   Not on file  Social History Narrative   Tobacco use, amount per day now: Quit 4 years ago.   Past tobacco use, amount per day: 1 pack per day.   How many years did you use tobacco: Age [age]   Alcohol use (drinks per week): Special Occassions   Diet: No   Do you drink/eat things with caffeine: Occasional Soda.   Marital status: Married                                 What year were you married? 2020   Do you live in a house, apartment, assisted living,  condo, trailer, etc.? House   Is it one or more stories? 3 stories.   How many persons live in your home? 3   Do you have pets in your home?( please list) Yes, 2 Dogs.    Highest Level of education completed? 12th Grade   Current or past profession: Mover/Packer   Do you exercise? Yes                                 Type and how often? Walking, Cardio, and Weights x 3 days a week.   Do you have a living will? No   Do you have a DNR form?   No                                If not, do you want to discuss one?   Do you have signed POA/HPOA forms? No                       If so, please bring to you appointment      Do you have any difficulty bathing or dressing yourself? No.   Do you have any difficulty preparing food or eating? No.   Do you have any difficulty managing your medications? No.   Do you have any difficulty managing your finances? No.   Do you have any difficulty affording your medications? No.   Social Drivers of Corporate investment banker Strain: Not on file  Food Insecurity: Not on file  Transportation Needs: Not on file  Physical Activity: Not on file  Stress: Not on file  Social Connections: Not on file    Current Medications:  Current Outpatient Medications:    amLODipine  (NORVASC ) 10 MG tablet, Take 1 tablet (10 mg total) by mouth daily., Disp: 90 tablet, Rfl: 2   calcium carbonate (TUMS - DOSED IN MG ELEMENTAL CALCIUM) 500 MG chewable tablet, Chew 1 tablet by mouth as needed for indigestion., Disp: , Rfl:    fluticasone  (FLONASE ) 50 MCG/ACT nasal spray, Place 1 spray into both nostrils daily as needed for allergies or rhinitis., Disp: , Rfl:  Meclizine  HCl 25 MG CHEW, CHEW ONE TABLET BY MOUTH THREE TIMES A DAY AS NEEDED, Disp: 20 tablet, Rfl: 1   Multiple Vitamin (MULTIVITAMIN) tablet, Take 1 tablet by mouth daily., Disp: , Rfl:    omeprazole (PRILOSEC) 20 MG capsule, Take 20 mg by mouth daily as needed., Disp: , Rfl:    phentermine  15 MG capsule, Take 1  capsule (15 mg total) by mouth every morning., Disp: 30 capsule, Rfl: 2   VITAMIN D  PO, Take 5,000 Units by mouth daily., Disp: , Rfl:   Review of Systems: Denies appetite changes, fevers, chills, fatigue, unexplained weight changes. Denies hearing loss, neck lumps or masses, mouth sores, ringing in ears or voice changes. Denies cough or wheezing.  Denies shortness of breath. Denies chest pain or palpitations. Denies leg swelling. Denies abdominal distention, pain, blood in stools, constipation, diarrhea, nausea, vomiting, or early satiety. Denies pain with intercourse, dysuria, frequency, hematuria or incontinence. Denies hot flashes, pelvic pain, vaginal bleeding or vaginal discharge.   Denies joint pain, back pain or muscle pain/cramps. Denies itching, rash, or wounds. Denies dizziness, headaches, numbness or seizures. Denies swollen lymph nodes or glands, denies easy bruising or bleeding. Denies anxiety, depression, confusion, or decreased concentration.  Physical Exam: BP 137/80 (BP Location: Left Arm, Patient Position: Sitting)   Pulse 66   Temp 98.3 F (36.8 C) (Oral)   Resp 18   Ht 5' 1.5" (1.562 m)   Wt 152 lb 9.6 oz (69.2 kg)   LMP 10/27/2016   SpO2 100%   BMI 28.37 kg/m  General: Alert, oriented, no acute distress. HEENT: Normocephalic, atraumatic, sclera anicteric. Chest: Clear to auscultation bilaterally.  No wheezes or rhonchi. Cardiovascular: Regular rate and rhythm, no murmurs. Abdomen: soft, nontender.  Normoactive bowel sounds.  No masses or hepatosplenomegaly appreciated.  Well-healed incisions. Extremities: Grossly normal range of motion.  Warm, well perfused.  No edema bilaterally. Skin: No rashes or lesions noted. Lymphatics: No cervical, supraclavicular, or inguinal adenopathy. GU: Normal appearing external genitalia without erythema, excoriation, or lesions.  Speculum exam reveals mildly atrophic vaginal mucosa, radiation changes present.  No bleeding or  discharge, no lesions.  Bimanual exam reveals cuff intact, no nodularity or masses.  Rectovaginal exam confirms these findings.  Performance status: ECOG 0  Laboratory & Radiologic Studies: None new  Assessment & Plan: Christy Waller is a 53 y.o. 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 On NRG-[id number].  Completed adjuvant vaginal brachytherapy in November 2021. Last imaging 12/2022: No evidence of recurrent or metastatic disease.   Patient is doing well.  Remains NED on exam.   Per study protocol, we transitioned to visits every 6 months.  She sees Dr. Eloise Hake in November and will return to see me in 12 months. Reviewed signs and symptoms that should prompt a phone call between visits.   Since she will continue to have regular scans with the study protocol, will hold off getting MR to reassess the liver lesion.  Can consider this in the future if any change in size or character of the lesion.  22 minutes of total time was spent for this patient encounter, including preparation, face-to-face counseling with the patient and coordination of care, and documentation of the encounter.  Wiley Hanger, MD  Division of Gynecologic Oncology  Department of Obstetrics and Gynecology  [facility]

## 2024-06-01 ENCOUNTER — Telehealth: Payer: Self-pay | Admitting: *Deleted

## 2024-06-01 NOTE — Telephone Encounter (Signed)
 NRG [id number] Patient confirmed her upcoming appt.with Dr. Shannon on 06/20/24 at 9:30 am.  Patient reports she continues to be uninsured and therefore does not want to have CT scan scheduled as required by the study protocol.  Informed patient that CT scan will not be ordered and to please let research nurse know if she obtains insurance before this visit and if she needs to change the appointment. She verbalized understanding.  Cherylyn Hoard, BSN, RN, [employer] Research Nurse II [phone] 06/01/2024 2:00 PM

## 2024-06-19 NOTE — Progress Notes (Signed)
 Radiation Oncology         [phone] ________________________________  Name: Christy Waller MRN: [id number]  Date: 06/20/2024  DOB: Jul 16, 1971  Follow-Up Visit Note  CC: Sherlynn Madden, MD  [facility], [facility]*  No diagnosis found.  Diagnosis: Diagnosis: Stage IB (pT1b, pN0) grade 2 endometrioid carcinoma with extensive LVSI     Interval Since Last Radiation: 3 years, 11 months, 15 days   Radiation Treatment Dates: 06/23/2020 through 07/07/2020 Site Technique Total Dose (Gy) Dose per Fx (Gy) Completed Fx Beam Energies  Vagina: Pelvis HDR-brachy 30/30 6 5/5 Ir-192      Narrative:  The patient returns today for routine follow-up. She was last seen here for follow-up on 07/05/23. Patient continued to follow up with their specialists to manage their chronic conditions.   In the interval since she was last seen, she presented for a follow up visit with Dr. Viktoria on 12/30/23 during which she was noted NED on exam with no symptoms or concerns indication disease recurrence.                    No other significant oncologic interval history since the patient was last seen.                             Allergies:  is allergic to lisinopril.  Meds: Current Outpatient Medications  Medication Sig Dispense Refill   amLODipine  (NORVASC ) 10 MG tablet Take 1 tablet (10 mg total) by mouth daily. 90 tablet 2   calcium carbonate (TUMS - DOSED IN MG ELEMENTAL CALCIUM) 500 MG chewable tablet Chew 1 tablet by mouth as needed for indigestion.     fluticasone  (FLONASE ) 50 MCG/ACT nasal spray Place 1 spray into both nostrils daily as needed for allergies or rhinitis.     Meclizine  HCl 25 MG CHEW CHEW ONE TABLET BY MOUTH THREE TIMES A DAY AS NEEDED 20 tablet 1   Multiple Vitamin (MULTIVITAMIN) tablet Take 1 tablet by mouth daily.     omeprazole (PRILOSEC) 20 MG capsule Take 20 mg by mouth daily as needed.     phentermine  15 MG capsule Take 1 capsule (15 mg total) by mouth every morning. 30  capsule 2   VITAMIN D  PO Take 5,000 Units by mouth daily.     No current facility-administered medications for this visit.    Physical Findings: The patient is in no acute distress. Patient is alert and oriented.  vitals were not taken for this visit. .  No significant changes. Lungs are clear to auscultation bilaterally. Heart has regular rate and rhythm. No palpable cervical, supraclavicular, or axillary adenopathy. Abdomen soft, non-tender, normal bowel sounds.   Lab Findings: Lab Results  Component Value Date   WBC 7.0 01/20/2022   HGB 13.3 01/20/2022   HCT 41.8 01/20/2022   MCV 87.8 01/20/2022   PLT 276 01/20/2022    Radiographic Findings: No results found.  Impression: Stage IB (pT1b, pN0) grade 2 endometrioid carcinoma with extensive LVSI    The patient is recovering from the effects of radiation.  ***  Plan:  ***   *** minutes of total time was spent for this patient encounter, including preparation, face-to-face counseling with the patient and coordination of care, physical exam, and documentation of the encounter. ____________________________________  Lynwood CHARM Nasuti, PhD, MD  This document serves as a record of services personally performed by Lynwood Nasuti, MD. It was created on his behalf  by Reymundo Cartwright, a trained medical scribe. The creation of this record is based on the scribe's personal observations and the provider's statements to them. This document has been checked and approved by the attending provider.

## 2024-06-20 ENCOUNTER — Encounter: Payer: Self-pay | Admitting: Radiation Oncology

## 2024-06-20 ENCOUNTER — Encounter: Payer: Self-pay | Admitting: *Deleted

## 2024-06-20 ENCOUNTER — Ambulatory Visit
Admission: RE | Admit: 2024-06-20 | Discharge: 2024-06-20 | Disposition: A | Discharge status: Home / Self Care | Payer: Self-pay | Source: Ambulatory Visit | Attending: Radiation Oncology | Admitting: Radiation Oncology

## 2024-06-20 VITALS — BP 161/93 | HR 57 | Temp 97.2°F | Resp 20 | Wt 163.8 lb

## 2024-06-20 DIAGNOSIS — C541 Malignant neoplasm of endometrium: Secondary | ICD-10-CM

## 2024-06-20 DIAGNOSIS — Z8542 Personal history of malignant neoplasm of other parts of uterus: Secondary | ICD-10-CM | POA: Insufficient documentation

## 2024-06-20 DIAGNOSIS — Z923 Personal history of irradiation: Secondary | ICD-10-CM | POA: Insufficient documentation

## 2024-06-20 DIAGNOSIS — Z79899 Other long term (current) drug therapy: Secondary | ICD-10-CM | POA: Insufficient documentation

## 2024-06-20 NOTE — Research (Signed)
 [id number] Arm 1, Follow-Up Visit 10 Patient comes into clinic today unaccompanied for follow up visit 10.    H&P Completed by Dr. Shannon today. See MD encounter note for details.   Adverse Events No SAEs or hospitalizations since last study visit.    CT Scan not done- Study required CT scan is overdue. Patient has been uninsured and is unable to afford the protocol required CT scan. Patient states her situation has not changed since last study visit and she is still unable to afford health insurance. Patient agrees to contact research nurse to schedule CT scan if her situation changes before the next visit if she is able to afford the scan. Dr. Shannon states he would not routinely be ordering CT scans at this point in patient's follow up. He will order imaging as needed based on symptoms or clinical exam.   Plan  H & P with clinical exam will continue every 6 months for 2 more years, alternating between Dr. Shannon and Dr. Viktoria.  Next visit with Dr. Shannon is due in one year and will be scheduled.  Dr. Viktoria will be scheduled in 6 months around 12/18/24 and patient is aware.  Thanked patient for her ongoing participation in this clinical trial and continuing to come to scheduled visits. Encouraged patient to contact research nurse if any questions or concerns prior to next contact. Patient verbalized understanding.   Cherylyn Hoard, BSN, RN, [employer] Research Nurse II [phone] 06/20/2024

## 2024-06-20 NOTE — Progress Notes (Signed)
 Christy Waller is here today for follow up post radiation to the pelvis.  They completed their radiation on:  07/07/20  Does the patient complain of any of the following:  Pain:No Abdominal bloating: No Diarrhea/Constipation: No Nausea/Vomiting: Nausea at times that's associated with vertigo.  Vaginal Discharge: No Blood in Urine or Stool: No Urinary Issues (dysuria/incomplete emptying/ incontinence/ increased frequency/urgency): No  Does patient report using vaginal dilator 2-3 times a week and/or sexually active 2-3 weeks: Yes Post radiation skin changes: No   Additional comments if applicable:    BP (!) 161/93   Pulse (!) 57   Temp (!) 97.2 F (36.2 C)   Resp 20   Wt 163 lb 12.8 oz (74.3 kg)   LMP 10/27/2016   SpO2 99%   BMI 30.45 kg/m

## 2024-07-20 ENCOUNTER — Other Ambulatory Visit: Payer: Self-pay

## 2024-07-20 ENCOUNTER — Other Ambulatory Visit: Payer: Self-pay | Admitting: Sports Medicine

## 2024-07-20 DIAGNOSIS — I10 Essential (primary) hypertension: Secondary | ICD-10-CM

## 2024-07-20 DIAGNOSIS — E669 Obesity, unspecified: Secondary | ICD-10-CM

## 2024-07-20 MED ORDER — PHENTERMINE HCL 15 MG PO CAPS: 15.0000 mg | ORAL_CAPSULE | ORAL | 2 refills | Status: DC

## 2024-09-12 ENCOUNTER — Ambulatory Visit (INDEPENDENT_AMBULATORY_CARE_PROVIDER_SITE_OTHER): Payer: Self-pay | Admitting: Family

## 2024-09-12 ENCOUNTER — Encounter: Payer: Self-pay | Admitting: Family

## 2024-09-12 VITALS — BP 136/84 | HR 64 | Temp 97.3°F | Ht 61.5 in | Wt 175.4 lb

## 2024-09-12 DIAGNOSIS — Z5181 Encounter for therapeutic drug level monitoring: Secondary | ICD-10-CM

## 2024-09-12 DIAGNOSIS — I10 Essential (primary) hypertension: Secondary | ICD-10-CM

## 2024-09-12 DIAGNOSIS — K219 Gastro-esophageal reflux disease without esophagitis: Secondary | ICD-10-CM

## 2024-09-12 DIAGNOSIS — G8929 Other chronic pain: Secondary | ICD-10-CM

## 2024-09-12 DIAGNOSIS — E669 Obesity, unspecified: Secondary | ICD-10-CM

## 2024-09-12 MED ORDER — PHENTERMINE HCL 15 MG PO CAPS: 15.0000 mg | ORAL_CAPSULE | ORAL | 0 refills | Status: AC

## 2024-09-13 ENCOUNTER — Other Ambulatory Visit: Payer: Self-pay

## 2024-09-14 LAB — DRUG MONITORING, PANEL 8 WITH CONFIRMATION, URINE
6 Acetylmorphine: NEGATIVE ng/mL
Alcohol Metabolites: NEGATIVE ng/mL
Amphetamines: NEGATIVE ng/mL
Benzodiazepines: NEGATIVE ng/mL
Buprenorphine, Urine: NEGATIVE ng/mL
Cocaine Metabolite: NEGATIVE ng/mL
Creatinine: 54.1 mg/dL
MDMA: NEGATIVE ng/mL
Marijuana Metabolite: 646 ng/mL — ABNORMAL HIGH
Marijuana Metabolite: POSITIVE ng/mL — AB
Opiates: NEGATIVE ng/mL
Oxidant: NEGATIVE ug/mL
Oxycodone: NEGATIVE ng/mL
pH: 8 (ref 4.5–9.0)

## 2024-09-14 LAB — DM TEMPLATE

## 2024-09-17 DIAGNOSIS — K219 Gastro-esophageal reflux disease without esophagitis: Secondary | ICD-10-CM | POA: Insufficient documentation

## 2024-12-21 ENCOUNTER — Inpatient Hospital Stay: Payer: Self-pay | Admitting: Gynecologic Oncology

## 2025-01-08 ENCOUNTER — Ambulatory Visit: Payer: Self-pay | Admitting: Gynecologic Oncology

## 2025-03-06 ENCOUNTER — Ambulatory Visit: Payer: Self-pay | Admitting: Family

## 2025-03-13 ENCOUNTER — Ambulatory Visit: Payer: Self-pay | Admitting: Family

## 2025-06-20 ENCOUNTER — Ambulatory Visit: Payer: Self-pay | Admitting: Radiation Oncology
